# Patient Record
Sex: Female | Born: 1935 | Race: White | Hispanic: No | Marital: Single | Smoking: Former smoker
Health system: Southern US, Community
[De-identification: ages and names within clinical notes are randomized; demographics above are authoritative.]

## PROBLEM LIST (undated history)

## (undated) DIAGNOSIS — E28319 Asymptomatic premature menopause: Secondary | ICD-10-CM

## (undated) DIAGNOSIS — D219 Benign neoplasm of connective and other soft tissue, unspecified: Secondary | ICD-10-CM

## (undated) DIAGNOSIS — F419 Anxiety disorder, unspecified: Secondary | ICD-10-CM

## (undated) DIAGNOSIS — K5790 Diverticulosis of intestine, part unspecified, without perforation or abscess without bleeding: Secondary | ICD-10-CM

## (undated) DIAGNOSIS — K219 Gastro-esophageal reflux disease without esophagitis: Secondary | ICD-10-CM

## (undated) DIAGNOSIS — T7029XA Other effects of high altitude, initial encounter: Secondary | ICD-10-CM

## (undated) DIAGNOSIS — D126 Benign neoplasm of colon, unspecified: Secondary | ICD-10-CM

## (undated) DIAGNOSIS — J869 Pyothorax without fistula: Secondary | ICD-10-CM

## (undated) DIAGNOSIS — G463 Brain stem stroke syndrome: Secondary | ICD-10-CM

## (undated) HISTORY — DX: Anxiety disorder, unspecified: F41.9

## (undated) HISTORY — DX: Benign neoplasm of connective and other soft tissue, unspecified: D21.9

## (undated) HISTORY — DX: Brain stem stroke syndrome: G46.3

## (undated) HISTORY — DX: Diverticulosis of intestine, part unspecified, without perforation or abscess without bleeding: K57.90

## (undated) HISTORY — DX: Other effects of high altitude, initial encounter: T70.29XA

## (undated) HISTORY — DX: Benign neoplasm of colon, unspecified: D12.6

## (undated) HISTORY — DX: Asymptomatic premature menopause: E28.319

## (undated) HISTORY — PX: LAMINECTOMY: SHX219

---

## 1981-08-19 DIAGNOSIS — G463 Brain stem stroke syndrome: Secondary | ICD-10-CM

## 1981-08-19 HISTORY — DX: Brain stem stroke syndrome: G46.3

## 1982-08-19 HISTORY — PX: BREAST LUMPECTOMY: SHX2

## 1988-08-19 HISTORY — PX: MASTECTOMY: SHX3

## 1994-08-19 HISTORY — PX: MASTECTOMY: SHX3

## 2001-05-05 ENCOUNTER — Ambulatory Visit: Admission: RE | Admit: 2001-05-05 | Discharge: 2001-05-05 | Payer: Self-pay | Admitting: Internal Medicine

## 2002-11-29 ENCOUNTER — Encounter: Payer: Self-pay | Admitting: Orthopedic Surgery

## 2002-11-29 ENCOUNTER — Ambulatory Visit (HOSPITAL_COMMUNITY): Admission: RE | Admit: 2002-11-29 | Discharge: 2002-11-29 | Payer: Self-pay | Admitting: Orthopedic Surgery

## 2003-01-13 ENCOUNTER — Inpatient Hospital Stay (HOSPITAL_COMMUNITY): Admission: RE | Admit: 2003-01-13 | Discharge: 2003-01-14 | Payer: Self-pay | Admitting: Neurosurgery

## 2003-01-13 ENCOUNTER — Encounter: Payer: Self-pay | Admitting: Neurosurgery

## 2003-03-02 ENCOUNTER — Ambulatory Visit (HOSPITAL_COMMUNITY): Admission: RE | Admit: 2003-03-02 | Discharge: 2003-03-02 | Payer: Self-pay | Admitting: Neurosurgery

## 2003-03-02 ENCOUNTER — Encounter: Payer: Self-pay | Admitting: Neurosurgery

## 2003-03-04 ENCOUNTER — Emergency Department (HOSPITAL_COMMUNITY): Admission: EM | Admit: 2003-03-04 | Discharge: 2003-03-04 | Payer: Self-pay | Admitting: *Deleted

## 2003-03-05 ENCOUNTER — Inpatient Hospital Stay (HOSPITAL_COMMUNITY): Admission: EM | Admit: 2003-03-05 | Discharge: 2003-03-06 | Payer: Self-pay | Admitting: Emergency Medicine

## 2003-03-06 ENCOUNTER — Inpatient Hospital Stay (HOSPITAL_COMMUNITY): Admission: EM | Admit: 2003-03-06 | Discharge: 2003-03-14 | Payer: Self-pay | Admitting: Emergency Medicine

## 2003-03-08 ENCOUNTER — Encounter: Payer: Self-pay | Admitting: Neurosurgery

## 2003-03-09 ENCOUNTER — Encounter: Payer: Self-pay | Admitting: Neurosurgery

## 2003-03-29 ENCOUNTER — Encounter: Payer: Self-pay | Admitting: Neurosurgery

## 2003-03-29 ENCOUNTER — Encounter: Payer: Self-pay | Admitting: Diagnostic Radiology

## 2003-03-29 ENCOUNTER — Encounter: Admission: RE | Admit: 2003-03-29 | Discharge: 2003-03-29 | Payer: Self-pay | Admitting: Neurosurgery

## 2003-04-13 ENCOUNTER — Encounter: Admission: RE | Admit: 2003-04-13 | Discharge: 2003-04-13 | Payer: Self-pay | Admitting: Neurosurgery

## 2003-04-13 ENCOUNTER — Encounter: Payer: Self-pay | Admitting: Neurosurgery

## 2003-04-30 ENCOUNTER — Emergency Department (HOSPITAL_COMMUNITY): Admission: EM | Admit: 2003-04-30 | Discharge: 2003-04-30 | Payer: Self-pay | Admitting: Emergency Medicine

## 2005-06-04 ENCOUNTER — Ambulatory Visit: Payer: Self-pay | Admitting: Internal Medicine

## 2006-08-19 DIAGNOSIS — T7020XA Unspecified effects of high altitude, initial encounter: Secondary | ICD-10-CM

## 2006-08-19 HISTORY — DX: Unspecified effects of high altitude, initial encounter: T70.20XA

## 2006-12-26 ENCOUNTER — Encounter: Payer: Self-pay | Admitting: Internal Medicine

## 2006-12-26 ENCOUNTER — Ambulatory Visit: Payer: Self-pay | Admitting: Internal Medicine

## 2006-12-26 LAB — CONVERTED CEMR LAB
AST: 15 units/L (ref 0–37)
BUN: 20 mg/dL (ref 6–23)
Basophils Absolute: 0 10*3/uL (ref 0.0–0.1)
Calcium: 10.1 mg/dL (ref 8.4–10.5)
Creatinine, Ser: 0.75 mg/dL (ref 0.40–1.20)
Eosinophils Relative: 2 % (ref 0–5)
Glucose, Bld: 141 mg/dL — ABNORMAL HIGH (ref 70–99)
HCT: 41.5 % (ref 36.0–46.0)
MCHC: 33 g/dL (ref 30.0–36.0)
Monocytes Absolute: 0.4 10*3/uL (ref 0.2–0.7)
Monocytes Relative: 6 % (ref 3–11)
Neutrophils Relative %: 63 % (ref 43–77)
Platelets: 319 10*3/uL (ref 150–400)
RBC: 4.17 M/uL (ref 3.87–5.11)
RDW: 13.3 % (ref 11.5–14.0)
Sodium: 138 meq/L (ref 135–145)
TSH: 0.972 microintl units/mL (ref 0.350–5.50)
WBC: 6.5 10*3/uL (ref 4.0–10.5)

## 2007-01-05 ENCOUNTER — Ambulatory Visit: Payer: Self-pay | Admitting: Internal Medicine

## 2007-01-22 ENCOUNTER — Ambulatory Visit: Payer: Self-pay | Admitting: Internal Medicine

## 2007-02-05 ENCOUNTER — Encounter: Payer: Self-pay | Admitting: Internal Medicine

## 2007-03-24 ENCOUNTER — Encounter: Payer: Self-pay | Admitting: Internal Medicine

## 2007-07-22 ENCOUNTER — Encounter: Payer: Self-pay | Admitting: Internal Medicine

## 2007-10-05 ENCOUNTER — Ambulatory Visit: Payer: Self-pay | Admitting: Internal Medicine

## 2007-10-05 DIAGNOSIS — M6530 Trigger finger, unspecified finger: Secondary | ICD-10-CM | POA: Insufficient documentation

## 2007-10-05 DIAGNOSIS — M720 Palmar fascial fibromatosis [Dupuytren]: Secondary | ICD-10-CM

## 2007-12-07 ENCOUNTER — Encounter: Payer: Self-pay | Admitting: Internal Medicine

## 2008-04-01 ENCOUNTER — Encounter: Payer: Self-pay | Admitting: Internal Medicine

## 2008-05-09 ENCOUNTER — Ambulatory Visit: Payer: Self-pay | Admitting: Internal Medicine

## 2008-05-19 ENCOUNTER — Ambulatory Visit: Payer: Self-pay | Admitting: Internal Medicine

## 2008-05-19 DIAGNOSIS — J45909 Unspecified asthma, uncomplicated: Secondary | ICD-10-CM | POA: Insufficient documentation

## 2008-05-19 DIAGNOSIS — R030 Elevated blood-pressure reading, without diagnosis of hypertension: Secondary | ICD-10-CM

## 2008-05-19 DIAGNOSIS — K219 Gastro-esophageal reflux disease without esophagitis: Secondary | ICD-10-CM | POA: Insufficient documentation

## 2008-09-05 ENCOUNTER — Encounter: Payer: Self-pay | Admitting: Internal Medicine

## 2008-11-21 ENCOUNTER — Encounter: Payer: Self-pay | Admitting: Internal Medicine

## 2009-05-15 ENCOUNTER — Encounter: Payer: Self-pay | Admitting: Internal Medicine

## 2009-05-24 ENCOUNTER — Ambulatory Visit: Payer: Self-pay | Admitting: Internal Medicine

## 2009-05-24 DIAGNOSIS — G5600 Carpal tunnel syndrome, unspecified upper limb: Secondary | ICD-10-CM | POA: Insufficient documentation

## 2009-05-24 DIAGNOSIS — R7309 Other abnormal glucose: Secondary | ICD-10-CM | POA: Insufficient documentation

## 2009-05-31 ENCOUNTER — Encounter: Payer: Self-pay | Admitting: Internal Medicine

## 2009-06-16 ENCOUNTER — Ambulatory Visit: Payer: Self-pay | Admitting: Internal Medicine

## 2009-06-19 ENCOUNTER — Encounter (INDEPENDENT_AMBULATORY_CARE_PROVIDER_SITE_OTHER): Payer: Self-pay | Admitting: *Deleted

## 2009-06-19 LAB — CONVERTED CEMR LAB
Hgb A1c MFr Bld: 5.3 % (ref 4.6–6.5)
TSH: 1.45 microintl units/mL (ref 0.35–5.50)

## 2009-07-03 ENCOUNTER — Telehealth (INDEPENDENT_AMBULATORY_CARE_PROVIDER_SITE_OTHER): Payer: Self-pay | Admitting: *Deleted

## 2009-08-19 HISTORY — PX: OTHER SURGICAL HISTORY: SHX169

## 2009-08-28 ENCOUNTER — Encounter: Payer: Self-pay | Admitting: Internal Medicine

## 2009-10-10 ENCOUNTER — Encounter: Payer: Self-pay | Admitting: Internal Medicine

## 2009-10-10 ENCOUNTER — Encounter (INDEPENDENT_AMBULATORY_CARE_PROVIDER_SITE_OTHER): Payer: Self-pay | Admitting: *Deleted

## 2009-10-10 DIAGNOSIS — D126 Benign neoplasm of colon, unspecified: Secondary | ICD-10-CM

## 2009-11-23 ENCOUNTER — Encounter (INDEPENDENT_AMBULATORY_CARE_PROVIDER_SITE_OTHER): Payer: Self-pay

## 2009-11-28 ENCOUNTER — Ambulatory Visit: Payer: Self-pay | Admitting: Internal Medicine

## 2009-12-05 ENCOUNTER — Ambulatory Visit: Payer: Self-pay | Admitting: Internal Medicine

## 2009-12-07 ENCOUNTER — Encounter: Payer: Self-pay | Admitting: Internal Medicine

## 2010-01-04 ENCOUNTER — Encounter: Payer: Self-pay | Admitting: Internal Medicine

## 2010-05-14 ENCOUNTER — Ambulatory Visit: Payer: Self-pay | Admitting: Internal Medicine

## 2010-05-14 DIAGNOSIS — M2550 Pain in unspecified joint: Secondary | ICD-10-CM | POA: Insufficient documentation

## 2010-05-18 ENCOUNTER — Ambulatory Visit: Payer: Self-pay | Admitting: Internal Medicine

## 2010-09-18 NOTE — Letter (Signed)
Summary: Previsit letter  [facility]  [address] [place], [place] [address]   Phone: [phone]  Fax: [phone]       10/10/2009 MRN: [id number]  Yolanda [name] [address] [place], [place]  [address]  Dear Ms. Claudia Pollock,  Welcome to the Gastroenterology Division at [facility].    You are scheduled to see a nurse for your pre-procedure visit on  11-21-09 at 9:30AM on the 3rd floor at [facility], [address]  We ask that you try to arrive at our office 15 minutes prior to your appointment time to allow for check-in.  Your nurse visit will consist of discussing your medical and surgical history, your immediate family medical history, and your medications.    Please bring a complete list of all your medications or, if you prefer, bring the medication bottles and we will list them.  We will need to be aware of both prescribed and over the counter drugs.  We will need to know exact dosage information as well.  If you are on blood thinners (Coumadin, Plavix, Aggrenox, Ticlid, etc.) please call our office today/prior to your appointment, as we need to consult with your physician about holding your medication.   Please be prepared to read and sign documents such as consent forms, a financial agreement, and acknowledgement forms.  If necessary, and with your consent, a friend or relative is welcome to sit-in on the nurse visit with you.  Please bring your insurance card so that we may make a copy of it.  If your insurance requires a referral to see a specialist, please bring your referral form from your primary care physician.  No co-pay is required for this nurse visit.     If you cannot keep your appointment, please call [phone] to cancel or reschedule prior to your appointment date.  This allows [place] the opportunity to schedule an appointment for another patient in need of care.    Thank you for choosing [facility] for your medical needs.   We appreciate the opportunity to care for you.  Please visit [place] at our website  to learn more about our practice.                     Sincerely.                                                                                                                   The Gastroenterology Division

## 2010-09-18 NOTE — Letter (Signed)
Summary: CMN for Mastectomy Bra/Second to [facility] for Mastectomy Bra/Second to [facility]   Imported By: Lanelle Bal 01/11/2010 10:10:00  _____________________________________________________________________  External Attachment:    Type:   Image     Comment:   External Document

## 2010-09-18 NOTE — Miscellaneous (Signed)
Summary: Orders Update  Clinical Lists Changes  Problems: Added new problem of SIGMOID POLYP (ICD-211.3) - 4 mm polyp L sigmoid on CT Colonography 08/28/2009 in [place] Orders: Added new Referral order of Gastroenterology Referral (GI) - Signed

## 2010-09-18 NOTE — Letter (Signed)
Summary: [facility] Instructions  [facility]  [address] [place], [place] [address]   Phone: [phone]  Fax: [phone]       Yolanda [name]    1934-03-05    MRN: [id number]       Procedure Day /Date: Tuesday  12/05/09     Arrival Time:   8:30am     Procedure Time:  9:30am     Location of Procedure:                    _X _  [facility] (4th Floor)    PREPARATION FOR COLONOSCOPY WITH MIRALAX  Starting 5 days prior to your procedure  Thursday 04/14 do not eat nuts, seeds, popcorn, corn, beans, peas,  salads, or any raw vegetables.  Do not take any fiber supplements (e.g. Metamucil, Citrucel, and Benefiber). ____________________________________________________________________________________________________   THE DAY BEFORE YOUR PROCEDURE         DATE: 04/18  DAY:  Monday  1   Drink clear liquids the entire day-NO SOLID FOOD  2   Do not drink anything colored red or purple.  Avoid juices with pulp.  No orange juice.  3   Drink at least 64 oz. (8 glasses) of fluid/clear liquids during the day to prevent dehydration and help the prep work efficiently.  CLEAR LIQUIDS INCLUDE: Water Jello Ice Popsicles Tea (sugar ok, no milk/cream) Powdered fruit flavored drinks Coffee (sugar ok, no milk/cream) Gatorade Juice: apple, white grape, white cranberry  Lemonade Clear bullion, consomm, broth Carbonated beverages (any kind) Strained chicken noodle soup Hard Candy  4   Mix the entire bottle of Miralax with 64 oz. of Gatorade/Powerade in the morning and put in the refrigerator to chill.  5   At 3:00 pm take 2 Dulcolax/Bisacodyl tablets.  6   At 4:30 pm take one Reglan/Metoclopramide tablet.  7  Starting at 5:00 pm drink one 8 oz glass of the Miralax mixture every 15-20 minutes until you have finished drinking the entire 64 oz.  You should finish drinking prep around 7:30 or 8:00 pm.  8   If you are nauseated, you may take the 2nd  Reglan/Metoclopramide tablet at 6:30 pm.        9    At 8:00 pm take 2 more DULCOLAX/Bisacodyl tablets.     THE DAY OF YOUR PROCEDURE      DATE:  04/19  DAY:  Tuesday  You may drink clear liquids until  7:30am   (2 HOURS BEFORE PROCEDURE).   MEDICATION INSTRUCTIONS  Unless otherwise instructed, you should take regular prescription medications with a small sip of water as early as possible the morning of your procedure.         OTHER INSTRUCTIONS  You will need a responsible adult at least 75 years of age to accompany you and drive you home.   This person must remain in the waiting room during your procedure.  Wear loose fitting clothing that is easily removed.  Leave jewelry and other valuables at home.  However, you may wish to bring a book to read or an iPod/MP3 player to listen to music as you wait for your procedure to start.  Remove all body piercing jewelry and leave at home.  Total time from sign-in until discharge is approximately 2-3 hours.  You should go home directly after your procedure and rest.  You can resume normal activities the day after your procedure.  The day of your procedure  you should not:   Drive   Make legal decisions   Operate machinery   Drink alcohol   Return to work  You will receive specific instructions about eating, activities and medications before you leave.   The above instructions have been reviewed and explained to me by   Ulis Rias RN  November 28, 2009 9:53 AM     I fully understand and can verbalize these instructions _____________________________ Date _______

## 2010-09-18 NOTE — Assessment & Plan Note (Signed)
Summary: FOLLOWUP APPT SINCE SHE IS IN TOWN///SPH   Vital Signs:  Patient profile:   77 year old female Weight:      109.4 pounds BMI:     19.45 Temp:     99.3 degrees F oral Pulse rate:   72 / minute Resp:     14 per minute BP sitting:   126 / 80  (left arm) Cuff size:   regular  Vitals Entered By: Shonna Chock CMA (May 14, 2010 3:30 PM) CC: Patient having difficulty with her hands , Lower Extremity Joint pain   CC:  Patient having difficulty with her hands  and Lower Extremity Joint pain.  History of Present Illness: Upper Extremity  Pain      This is a 77 year old woman who presents with  pain in 3rd L finger X 3-6 months.  The patient reports swelling and decreased ROM, but denies redness, locking, stiffness for >1 hr, and weakness.    The pain is described as sharp and constant.  The patient denies the following symptoms: fever, rash, photosensitivity, eye symptoms, diarrhea, and dysuria.  Pain is also present in palms bilaterally. Rx: none.  Current Medications (verified): 1)  Cymbalta-? Dose .Marland Kitchen.. 1 By Mouth Once Daily 2)  Trazodone Hcl 50 Mg  Tabs (Trazodone Hcl) .Marland Kitchen.. 1 By Mouth Qhs  Allergies: 1)  ! * No Mri  Physical Exam  General:  Thin,in no acute distress; alert,appropriate and cooperative throughout examination Pulses:  R and L  radial pulses are full and equal bilaterally Extremities:  Mild Dupuytren's contracture bilaterally. Mild OA DIP changes. Fusiform PIP enlargement 3rd L joint Skin:  Intact without suspicious lesions or rashes Cervical Nodes:  No lymphadenopathy noted Axillary Nodes:  No palpable lymphadenopathy   Impression & Recommendations:  Problem # 1:  ARTHRALGIA (ICD-719.40)  Orders: T-Finger(s) (73140TC)  Complete Medication List: 1)  Cymbalta-? Dose  .Marland KitchenMarland Kitchen. 1 by mouth once daily 2)  Trazodone Hcl 50 Mg Tabs (Trazodone hcl) .Marland Kitchen.. 1 by mouth qhs 3)  Voltaren 1 % Gel (Diclofenac sodium) .... Apply qid as needed to affected  finger  joint  Patient Instructions: 1)  Xrays @ [facility] facility of affected joint . Prescriptions: VOLTAREN 1 % GEL (DICLOFENAC SODIUM) apply qid as needed to affected  finger joint  #15 grams x 3   Entered and Authorized by:   Marga Melnick MD   Signed by:   Marga Melnick MD on 05/14/2010   Method used:   Print then Give to Patient   RxID:   [id number]   Appended Document: FOLLOWUP APPT SINCE SHE IS IN TOWN///SPH Flu Vaccine Consent Questions     Do you have a history of severe allergic reactions to this vaccine? no    Any prior history of allergic reactions to egg and/or gelatin? no    Do you have a sensitivity to the preservative Thimersol? no    Do you have a past history of Guillan-Barre Syndrome? no    Do you currently have an acute febrile illness? no    Have you ever had a severe reaction to latex? no    Vaccine information given and explained to patient? yes    Are you currently pregnant? no    Lot Number:[id number]   Exp Date:02/16/2011   Site Given  Left Deltoid IM

## 2010-09-18 NOTE — Letter (Signed)
Summary: Patient Notice- Polyp Results  [facility]  [address] [place], [place] [address]   Phone: [phone]  Fax: [phone]        December 07, 2009 MRN: [id number]    Yolanda [name] [address] [place], [place]  [address]    Dear [name],  I am pleased to inform you that the colon polyp(s) removed during your recent colonoscopy was (were) found to be benign (no cancer detected) upon pathologic examination.The polyp was adenomatous ( precancerous)  I recommend you have a repeat colonoscopy examination in 5_ years to look for recurrent polyps, as having colon polyps increases your risk for having recurrent polyps or even colon cancer in the future.  Should you develop new or worsening symptoms of abdominal pain, bowel habit changes or bleeding from the rectum or bowels, please schedule an evaluation with either your primary care physician or with me.  Additional information/recommendations:  _x_ No further action with gastroenterology is needed at this time. Please      follow-up with your primary care physician for your other healthcare      needs.  __ Please call [phone] to schedule a return visit to review your      situation.  __ Please keep your follow-up visit as already scheduled.  __ Continue treatment plan as outlined the day of your exam.  Please call us if you are having persistent problems or have questions about your condition that have not been fully answered at this time.  Sincerely,  Hart Carwin MD  This letter has been electronically signed by your physician.  Appended Document: Patient Notice- Polyp Results letter mailed 4.25.11  Appended Document: Patient Notice- Polyp Results UNABLE TO REACH PATIENT TO CORRECT ADDRESS. LETTER UNDELIVERABLE AS ADDRESSED.

## 2010-09-18 NOTE — Miscellaneous (Signed)
Summary: Lec previsit  Clinical Lists Changes  Medications: Added new medication of MIRALAX   POWD (POLYETHYLENE GLYCOL 3350) As per prep  instructions. - Signed Added new medication of REGLAN 10 MG  TABS (METOCLOPRAMIDE HCL) As per prep instructions. - Signed Added new medication of DULCOLAX 5 MG  TBEC (BISACODYL) Day before procedure take 2 at 3pm and 2 at 8pm. - Signed Rx of MIRALAX   POWD (POLYETHYLENE GLYCOL 3350) As per prep  instructions.;  #255gm x 0;  Signed;  Entered by: Ulis Rias RN;  Authorized by: Hart Carwin MD;  Method used: Electronically to [facility] [phone]*, [address] [place], [place]  [address], Ph: [phone], Fax: [phone] Rx of REGLAN 10 MG  TABS (METOCLOPRAMIDE HCL) As per prep instructions.;  #2 x 0;  Signed;  Entered by: Ulis Rias RN;  Authorized by: Hart Carwin MD;  Method used: Electronically to [facility] [phone]*, [address], [place], [place]  [address], Ph: [phone], Fax: [phone] Rx of DULCOLAX 5 MG  TBEC (BISACODYL) Day before procedure take 2 at 3pm and 2 at 8pm.;  #4 x 0;  Signed;  Entered by: Ulis Rias RN;  Authorized by: Hart Carwin MD;  Method used: Electronically to [facility] [phone]*, [address], [place], [place]  [address], Ph: [phone], Fax: [phone]    Prescriptions: DULCOLAX 5 MG  TBEC (BISACODYL) Day before procedure take 2 at 3pm and 2 at 8pm.  #4 x 0   Entered by:   Ulis Rias RN   Authorized by:   Hart Carwin MD   Signed by:   Ulis Rias RN on 11/28/2009   Method used:   Electronically to        [facility] [phone]* (retail)       [address]       [place], [place]  [address]       Ph: [phone]       Fax: [phone]   RxID:   [id number] REGLAN 10 MG  TABS (METOCLOPRAMIDE HCL) As per prep instructions.  #2 x 0   Entered by:   Ulis Rias RN   Authorized by:   Hart Carwin MD   Signed by:   Ulis Rias RN on 11/28/2009   Method used:   Electronically to        [facility] [phone]* (retail)       [address]       [place], [place]  [address]       Ph: [phone]       Fax: [phone]   RxID:   [id number] MIRALAX   POWD (POLYETHYLENE GLYCOL 3350) As per prep  instructions.  #255gm x 0   Entered by:   Ulis Rias RN   Authorized by:   Hart Carwin MD   Signed by:   Ulis Rias RN on 11/28/2009   Method used:   Electronically to        [facility] [phone]* (retail)       [address]       [place], [place]  [address]       Ph: [phone]       Fax: [phone]   RxID:   [id number]

## 2010-09-18 NOTE — Procedures (Signed)
Summary: Colonoscopy  Patient: [name] Note: All result statuses are Final unless otherwise noted.  Tests: (1) Colonoscopy (COL)   COL Colonoscopy           DONE     [facility]     [address]     [place], [place]  [address]           COLONOSCOPY PROCEDURE REPORT           PATIENT:  Yolanda [name], Yolanda [name]  MR#:  [id number]     BIRTHDATE:  17-Jul-1934, 75 yrs. old  GENDER:  female     ENDOSCOPIST:  Hedwig Morton. Juanda Chance, MD     REF. BY:  Marga Melnick, M.D.     PROCEDURE DATE:  12/05/2009     PROCEDURE:  Colonoscopy 29562     ASA CLASS:  Class II     INDICATIONS:  Routine Risk Screening virtual colonoacopy suggested     4 mm polyp in the sigmoid colon     MEDICATIONS:   Versed 10 mg, Fentanyl 100 mcg, Benadryl 25 mg           DESCRIPTION OF PROCEDURE:   After the risks benefits and     alternatives of the procedure were thoroughly explained, informed     consent was obtained.  Digital rectal exam was performed and     revealed no rectal masses.   The LB [id number] endoscope     was introduced through the anus and advanced to the cecum, which     was identified by the ileocecal valve, without limitations.  The     quality of the prep was good, using MiraLax.  The instrument was     then slowly withdrawn as the colon was fully examined.     <<PROCEDUREIMAGES>>           FINDINGS:  A pedunculated polyp was found in the sigmoid colon. at     20 cm 4 mm polyps removed with cold snare, hemostasis achieved     with hot biopsy Polyp was snared without cautery. Retrieval was     successful (see image7, image8, image9, and image10). snare polyp     Mild diverticulosis was found throughout the colon (see image6 and     image1).  This was otherwise a normal examination of the colon     (see image2, image3, image4, image5, and image11).   Retroflexed     views in the rectum revealed no abnormalities.    The scope was     then withdrawn from the patient and the procedure  completed.           COMPLICATIONS:  None     ENDOSCOPIC IMPRESSION:     1) Pedunculated polyp in the sigmoid colon     2) Mild diverticulosis throughout the colon     3) Otherwise normal examination     the polyps correlates with the description on virtual     colonoscopy     RECOMMENDATIONS:     1) Await pathology results     2) High fiber diet.     REPEAT EXAM:  In 5 year(s) for.           ______________________________     Hedwig Morton. Juanda Chance, MD           CC:           n.     eSIGNED:   Hedwig Morton. Gwendalynn Eckstrom at 12/05/2009 10:27  AM           Venida Jarvis, [id number]  Note: An exclamation mark (!) indicates a result that was not dispersed into the flowsheet. Document Creation Date: 12/05/2009 10:28 AM _______________________________________________________________________  (1) Order result status: Final Collection or observation date-time: 12/05/2009 10:20 Requested date-time:  Receipt date-time:  Reported date-time:  Referring Physician:   Ordering Physician: Lina Sar [phone]) Specimen Source:  Source: Launa Grill Order Number: [id number] Lab site:   Appended Document: Colonoscopy     Procedures Next Due Date:    Colonoscopy: 12/2014

## 2010-09-18 NOTE — Letter (Signed)
Summary: Primary Care Consult Scheduled Letter  [facility]  [address] [place], [place] [address]   Phone: [phone]  Fax: [phone]      10/10/2009 MRN: [id number]  Yolanda [name] [address] [place], [place]  [address]    Dear Ms. Yolanda [name],    We have scheduled an appointment for you.  At the recommendation of Dr. Marga Melnick, we have scheduled you for a Screening Colonoscopy with Dr. Lina Sar with [facility].  Your initial Pre-Visit with a nurse is on 11-21-2009 at 9:30am.  Your Screening Colonoscopy with Dr. Juanda Chance is on 12-05-2009 arrive no later than 8:30am.  Their address is [address] [place] [address]. The office phone number is [phone].  If this appointment day and time is not convenient for you, please feel free to call the office of the doctor you are being referred to at the number listed above and reschedule the appointment.    It is important for you to keep your scheduled appointments. We are here to make sure you are given good patient care.   Thank you,    Renee, Patient Care Coordinator [place] [facility]

## 2010-09-18 NOTE — Procedures (Signed)
Summary: CT Colongraphy/[facility]  CT Colongraphy/[facility]   Imported By: Lanelle Bal 10/16/2009 09:37:55  _____________________________________________________________________  External Attachment:    Type:   Image     Comment:   External Document

## 2011-01-01 NOTE — Assessment & Plan Note (Signed)
[facility]                        [facility] [facility] OFFICE NOTE   NAME:BARRINGERJaliya, [name]                    MRN:          [id number]  DATE:12/26/2006                            DOB:          1934-08-02    Yolanda [name] was seen Dec 26, 2006, for preoperative clearance for colonoscopy  and to update records.   Significantly, three weeks ago, she had high-altitude sickness in [place].  An EKG was done at that time.   Her past history is long and complicated.  In 1984, she had a  lumpectomy, followed by radiation.  In 1990, she had recurrence in the  same site, requiring mastectomy.  In 1996, she had a left mastectomy for  intraductal cancer.  She did take Lupron for pseudomenopause/fibroids.  She has had four separate back surgeries.   Because of the chronic pain, she was previously followed in a pain  clinic and had a nerve stimulator placed.  There was suboptimal  response.  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   Her family history is incredibly good.  Maternal grandmother had asthma.   She quit smoking in 1980.  She drinks socially.  She has no known drug  allergies.   1. She is on Trazodone 50 mg at bedtime.  2. Prozac 40 mg daily.  3. Celebrex and Oxycodone as needed.   She does have significant pain in the left shoulder with decreased range  of motion.   She is not exercising.  She denies any cardiopulmonary symptoms.  Diet  is described as heart healthy.   She has no GI symptomatology.   On exam, her weight is stable at 117.6, pulse is 64, respiratory rate 14  and blood pressure 110/62.  She appears much younger than her stated  age.  She denies any cosmetic surgery.   The thyroid is small and slightly asymmetric, without nodules.  She has  no lymphadenopathy about the head, neck or axillae.   She does have a grade 1 systolic murmur, loudest at the base.  Her chest  is clear.   She has multiple  operative scars over the abdomen.  She has no masses or  tenderness.   Pedal pulses are slightly decreased.  She has no ischemic change and  there is no edema.   Neuropsychiatric exam is negative.   There is marked restriction of movement over the left shoulder.  She  does have some degenerative changes in her hands.   Stool cards will be provided.  She has had no recent labs done and the  last labs on our chart are from 2003.  A CBC, BMET, and liver functions  will be drawn.   Her chart has been marked she is not a candidate for MRI, due to the  retained lead in her spine.   A screening colonoscopy will be requested, if these studies are  negative.     Titus Dubin. Alwyn Ren, MD,FACP,FCCP  Electronically Signed    [facility]  DD: 12/26/2006  DT: 12/26/2006  Job #: [id number]

## 2011-01-04 NOTE — H&P (Signed)
NAME:  Yolanda [name], Yolanda [name]                       ACCOUNT NO.:  [id number]   MEDICAL RECORD NO.:  [id number]                   PATIENT TYPE:  INP   LOCATION:  3007                                 FACILITY:  [facility]   PHYSICIAN:  Stefani Dama, M.D.               DATE OF BIRTH:  02/21/1934   DATE OF ADMISSION:  03/05/2003  DATE OF DISCHARGE:  03/06/2003                                HISTORY & PHYSICAL   ADMITTING DIAGNOSES:  1. Intractable back pain.  2. Status post lumbar laminectomy, three weeks.  3. Rule out deep space infection, osteomyelitis.   HISTORY OF PRESENT ILLNESS:  Yolanda [name] is a 77 year old white female,  status post lumbar laminectomy three weeks ago.  She started to develop  severe spasms in her back this past week and MRIs of the lumbar spine and  the left hip were apparently performed and she was to be seen by Dr. Hewitt Shorts yesterday, however, she reported to the emergency room and she  was incapacitated with spasms in her hip and her left lower extremity.  She  was treated with IV Valium and this seemed to resolve the situation and she  was able to be discharged home, however, this morning, the patient developed  severe incapacitating spasms in the left hip and left lower extremity; she  was brought back to the emergency department.  She notes that she has been  in generally good health, has had previous operations, but has not  experienced such severe pain in the hip and leg.  She has not really  complained of significant pain in her back per se.   PAST MEDICAL HISTORY:  Her past medical history notes that she has generally  had good health, save for a history of a stroke in the past.   CURRENT MEDICATIONS:  Her current medications include:  1. Evista.  2. Prozac 20 mg a day.   ALLERGIES:  She notes no allergies to any medications.   PHYSICAL EXAMINATION:  VITAL SIGNS:  Her physical exam at this time reveals  that her temperature is 97, her  blood pressure is 130/82, heart rate is 90  and respirations are 16 and unlabored.  GENERAL:  She is alert, oriented and very anxious with a severe racking  spasm on the left hip and left leg with slight movement.  When I ask her to  roll up onto her side, she cannot do so, but appears to have a moderate  amount of tremulousness; after 5 mg of Valium intravenously, this  tremulousness subsides and the spasms are much less severe and she is able  to roll up onto the left side to be examined.  BACK/MUSCULOSKELETAL:  Examination of the incision her back reveals that it  is clean, dry, flat and well-healed.  There is no tenderness to palpation  and even light percussion in the incision, however, certain percussion tends  to reproduce pain in the region of the left hip.  Motion in the region of  the left hip tends to aggravate the pain in the anterior thigh and left hip  region.  NEUROLOGIC:  Her motor strength appears good in the iliopsoas, quad,  tibialis anterior and gastrocs bilaterally.  Deep tendon reflexes are 2+ in  the patellae and 1+ in the Achilles.  Babinskis are downgoing.  Sensation is  intact.  HEENT:  Exam is normal.  LUNGS:  Lungs are clear to auscultation.  HEART:  The heart has a regular rate and rhythm.  No murmurs are heard.  ABDOMEN:  The abdomen is soft.  Bowel sounds are positive.  No masses are  palpable.  EXTREMITIES:  Extremities reveal no cyanosis, clubbing or edema.  NEUROLOGIC:  Cranial nerve examination reveals the pupils are 4 mm, briskly  reactive to light and accommodation.  The extraocular movements are full and  the face is symmetric to grimace.  Tongue and uvula are in the midline.   IMPRESSION:  The patient has evidence of significant, quite severe back pain  without evidence of any reported abnormality on the MRI.  I suggested that  we admit the patient and observe her for the possibility of a deep space  infection and draw blood cultures if  appropriate.  A complete blood count  and a sedimentation rate are now pending.  I will review the patient's  previous MRIs to see if the findings are accurate, according to what has  been reported.                                               Stefani Dama, M.D.    Merla Riches  D:  03/05/2003  T:  03/07/2003  Job:  [id number]

## 2011-01-04 NOTE — Consult Note (Signed)
   NAME:  Yolanda [name], Yolanda [name] NO.:  [id number]   MEDICAL RECORD NO.:  [id number]                   PATIENT TYPE:  INP   LOCATION:                                       FACILITY:  [facility]   PHYSICIAN:  Stefani Dama, M.D.               DATE OF BIRTH:  [birth year]   DATE OF CONSULTATION:  03/05/2003  DATE OF DISCHARGE:                                   CONSULTATION   REASON FOR CONSULTATION:  Intractable pain.   HISTORY OF PRESENT ILLNESS:  Patient is a 77 year old individual, whom I  just discharged from the hospital this afternoon.  She was admitted  yesterday with intractable pain in the region of the left hip, and, at the  time of discharge, the patient was ambulatory with just some minimal  discomfort in the left hip, and no workup prior to her admission.  From an  admission to the emergency room the day before; that is Friday, March 04, 2003, revealed an MRI of the lumbar spine that showed typical postoperative  changes with no abnormalities and an MRI of the hip, which showed no  pathology.  Blood tests, including a CBC and a sed rate, were both  completely normal.  Patient had suspicion of a low-grade temperature of  100.5; however, no other temperature spikes have been noted.  The patient  was treated for muscular spasms, and this seemed to resolve the situation,  but now she presents back to the emergency room.  She will be readmitted for  further care in this regard.  Situation will be discussed with Dr. Shirlean Kelly in the morning.                                               Stefani Dama, M.D.    Merla Riches  D:  03/07/2003  T:  03/07/2003  Job:  [id number]

## 2011-01-04 NOTE — H&P (Signed)
NAME:  Yolanda [name], Yolanda [name]                       ACCOUNT NO.:  [id number]   MEDICAL RECORD NO.:  [id number]                   PATIENT TYPE:  INP   LOCATION:  3007                                 FACILITY:  [facility]   PHYSICIAN:  Stefani Dama, M.D.               DATE OF BIRTH:  [birth year]   DATE OF ADMISSION:  03/05/2003  DATE OF DISCHARGE:  03/06/2003                                HISTORY & PHYSICAL   ADMISSION DIAGNOSES:  Intractable hip pain.   HISTORY OF PRESENT ILLNESS:  The patient is a 77 year old individual who was  admitted to the hospital on March 05, 2003 for difficulties with intractable  hip and left leg pain. She was discharged earlier this afternoon at 2:00 and  was ambulatory, feeling relatively comfortable, minimal pain. She was given  prescriptions for Percocet #60, Valium 5 mg #40 without refills, and Bextra  10 mg b.i.d. Approximately 8:00 this evening, I was contacted, indicating  that the patient was experiencing severe and excruciating pain. She had had  a workup including a MRI of the hip and lumbar spine. The lumbar spine shows  typical postoperative changes at L4-L5 on the left side, and the hip films  are completely within the limits of normal without any evidence of a  fracture, joint diffusion, or other significant pathology. Nonetheless, the  patient complaints of severe gripping pain in the left hip such that she  cannot move the hip at all. After 1 mg of IV Ativan, the patient does feel  significantly improved. Her other past medical history is noted on her  previous admission.   PHYSICAL EXAMINATION:  At this time reveals after some medication she has  mobility in the region of the left hip. She still notes a gripping pain at  times when she moves about. Her motor strength is good in the iliopsoas,  quadriceps, tibialis, and gastrocs. Reflexes were 2+ and symmetrical in the  patellae and in the Achilles; Babinskis are downgoing. Sensation is intact  in the left lower extremity. Incision is well healed, and there is no  palpable or percussive tenderness in the lumbar spine incision. Her upper  strength and reflexes are normal, and cranial nerve examination reveals that  the pupils are 4 mm, brisk, reactive to light and accommodation. Extraocular  movements are full. The face is symmetric. Tongue and uvula in the midline.  Sclerae and conjunctivae are clear.  NECK:  Supple. No masses are noted.  LUNGS:  Clear to auscultation.  HEART:  Regular rate and rhythm; no murmurs are heard.  ABDOMEN:  Soft, bowel sounds positive. No masses are palpable.  EXTREMITIES:  Reveal no clubbing, cyanosis, or edema.   IMPRESSION:  The patient is readmitted now with intractable lumbar spine  pain status post three week lumbar laminectomy and decompression. She will  be treated with medications for the time being; however, further workup and  additional consultation will  be warranted.                                               Stefani Dama, M.D.    Merla Riches  D:  03/07/2003  T:  03/07/2003  Job:  [id number]

## 2011-01-04 NOTE — Op Note (Signed)
NAME:  Yolanda [name], Yolanda [name]                       ACCOUNT NO.:  [id number]   MEDICAL RECORD NO.:  [id number]                   PATIENT TYPE:  INP   LOCATION:  3012                                 FACILITY:  [facility]   PHYSICIAN:  Hewitt Shorts, M.D.            DATE OF BIRTH:  12-20-1933   DATE OF PROCEDURE:  01/13/2003  DATE OF DISCHARGE:                                 OPERATIVE REPORT   PREOPERATIVE DIAGNOSIS:  Left L4-L5 foraminal lumbar degenerative disc  disease, spondylosis and radiculopathy.   POSTOPERATIVE DIAGNOSIS:  Left L4-L5 foraminal lumbar degenerative disc  disease, spondylosis and radiculopathy.   PROCEDURE:  Left L4-L5 lumbar laminotomy and microdiscectomy with  microdissection.   SURGEON:  Hewitt Shorts, M.D.   ASSISTANT:  Clydene Fake, M.D.   ANESTHESIA:  General endotracheal anesthesia.   INDICATIONS FOR PROCEDURE:  This 77 year old woman presented with left  lumbar radiculopathy and was found by MRI scan to have a fairly large left  L4-L5 foraminal disc herniation extending into the lateral epidural space as  well as in the extraforaminal space.  The decision was made to proceed with  exploration both through the canal as well as extraforaminally.   PROCEDURE:  The patient was brought to the operating room and placed under  general endotracheal anesthesia.  The patient was turned to the prone  position and the lumbar region was prepped with DuraPrep and draped in a  sterile fashion.  The midline was infiltrated with local anesthetic with  epinephrine.  An x-ray was taken, the L4-L5 level identified, and then the  previous midline incision was reopened and dissection was carried down  through the subcutaneous tissue to the level of the lumbar fascia which was  incised to the left side of the midline and the paraspinal muscles with  dissection of the spinous process and lamina in a subperiosteal fashion.  The L4-L5 interlaminar space was  identified.  Dissection was carried out  laterally over the facet joint.  We identified the left L5 transverse  process.  We then proceeded with a laminotomy using the [facility] Max drill  along with Kerrison punches.  The microscope was draped and brought onto the  field to provide illumination and visualization and the remainder of the  procedure was performed using microdissection and microsurgical technique.  The laminotomy was continued and we carefully resected the ligamentum flavum  which was thickened and partially calcified.  We identified the lateral  aspect of the thecal sac and identified the left L4-L5 foramen and the disc  which was degenerated with a large spondylotic subligamentous disc  herniation.  We then explored the extraforaminal space.  We were able to  expose the left L4 nerve root and the left L4-L5 annulus and disc.  There  was, again, subligamentous spondylitic disc herniation.  The annulus was  incised both extraforaminally as well as in the left lateral epidural space.  We  entered into the disc space and proceeded with a thorough discectomy  using a variety of pituitary rongeurs.  We then carefully explored the left  L4-L5 foramen both from within the canal working laterally as well as from  the extraforaminal space working medially.  There is subligamentous disc  herniation that was removed.  There was significant spondylitic overgrowth.  In the end, good decompression of the foramen and removal of all of the disc  herniation was achieved both within the canal, the foramen, and the  extraforaminal space.  We were able to decompress the thecal sac and left L4  nerve root.  We did examine the left L5 nerve root as it exited through the  nerve root foramen below and there was no evidence of compression at that  level.  It was felt that good decompression was achieved with removal of all  loose fragments of disc material.  Once the discectomy was completed,  hemostasis  was established with the use of bipolar cautery and then we  instilled 2 cc of Fentanyl and 80 mg Depo-Medrol into the epidural space and  extraforaminal space and then proceeded with closure.  The deep fascia was  closed with interrupted undyed #1 Vicryl suture, the subcutaneous and  subcuticular layer was closed with interrupted inverted 2-0 Vicryl sutures,  and the skin was reapproximated with Dermabond.  The patient tolerated the  procedure well.  Estimated blood loss was 35 cc.  Sponge counts were  correct.  Following surgery, the patient was turned back to the supine  position, reversed from the anesthetic, extubated, and transferred to the  recovery room for further care where she was noted to be moving all four  extremities to command.                                               Hewitt Shorts, M.D.    RWN/MEDQ  D:  01/13/2003  T:  01/13/2003  Job:  [id number]

## 2011-01-04 NOTE — Discharge Summary (Signed)
NAME:  Yolanda [name], Yolanda [name]                       ACCOUNT NO.:  [id number]   MEDICAL RECORD NO.:  [id number]                   PATIENT TYPE:  INP   LOCATION:  3007                                 FACILITY:  [facility]   PHYSICIAN:  Hewitt Shorts, M.D.            DATE OF BIRTH:  Apr 25, 1934   DATE OF ADMISSION:  03/06/2003  DATE OF DISCHARGE:  03/14/2003                                 DISCHARGE SUMMARY   ADMISSION HISTORY AND PHYSICAL EXAMINATION:  The patient is a 77 year old  woman who underwent a left L4-5 lumbar laminotomy and extra foraminal  exploration for lumbar diskectomy on Jan 13, 2003. The patient over the past  month has been having increased difficulty with recurrent pain, particularly  in the lateral left hip; however, beginning two days prior to admission, she  developed incapacitating spasms associated with the pain. She had been  hospitalized on July 17 by Dr. Danielle Dess, discharged on July 18 but returned  once again to the emergency room on July 18 and was readmitted by Dr.  Danielle Dess. Further details are included in his admission note.   The patient had MRI of the lumbar spine and hips done a week or so prior to  hospitalization. We did find on examination she had full strength through  the lower extremities with symmetrical reflexes. She was started on Flexeril  and subsequently Neurontin was started, and we were able to gradual increase  the Neurontin through the hospitalization. The patient continued to have  episodic disabling spasms. We did x-rays of her pelvis and left hip which  were unremarkable. MRI was repeated of the lumbar spine and reviewed in  detail with Dr. Paulina Fusi from the radiology department. We found  extensive post surgical changes at the L4-5 level, both at the level of  epidural space as well as in the extra foraminal space, but no evidence of  recurrent disk herniation or infection. We did find moderate canal stenosis  and marked bilateral  recessed stenosis at the L3-4 level.   We discussed options with the patient including continued rest and  medications versus selective spinal injection, specifically a L3-4 epidural  steroid injection versus an extensive surgical intervention with  decompression of the stenosis at L3-4, further decompression on the left  side at L3-4, and L3 to L5 fusion. She has indicated that she would like to  avoid surgery at all costs. She vigorously asked to be treated with a  tapering oral steroid dose, and after a lengthy discussion, we agreed to  that. She was started on prednisone at a relatively high dose, and it was  steadily tapered through the hospitalization, and with that along with  gradually increasing the Neurontin dose to 300 mg in the morning and 600 mg  at night, the discomfort has eased, and the frequency and intensity of  spasms has significantly lessened. The patient did request transfer to  neurosurgery at [facility]; however, on contact of the  physicians there, they were unable to accept her in transfer, and she  accepted that.   At this point, the patient is asking to be discharged to home. She feels  that the discomfort is manageable, and with that in mind, we have given her  detailed instructions about limiting her bending, lifting, twisting,  turning, carrying, and so on. She is not to drive. She is to do a small  amount of walking. She is not to travel any great distance. She is to return  to see me in a week and a half for followup. At that point, we may initiate  program of physical therapy or may consider epidural steroid injections.   DISCHARGE MEDICATIONS:  1. Prednisone 5 mg tablets. She is take two tablets b.i.d. for two days,     then one tablet q.a.m. and two tablets q.p.m. for two days, then one     tablet b.i.d. for two days, then one tablet q.d. for two days, then     discontinue. Twenty tablets prescribed, no refills.  2. Darvocet-N 100  one tablet p.o. t.i.d. p.r.n. pain, 40 tablets, no     refills.  3. Flexeril 10 mg t.i.d., 100 tablets, three refills.  4. Neurontin 300 mg capsules, one capsule q.a.m., two capsules q.p.m., 100     capsules prescribed, and five refills.  5. She is to continue on her other home medications such as Prozac.   DISCHARGE DIAGNOSES:  1. Lumbar radiculopathy.  2. Lumbar stenosis.  3. Lumbar spondylosis.  4. Lumbar degenerative disk disease.  5. Post surgical changes on the left at L4-5.  6. Lumbar radiculopathy and incapacitating muscular spasms.                                                Hewitt Shorts, M.D.    RWN/MEDQ  D:  03/14/2003  T:  03/15/2003  Job:  [id number]

## 2011-07-25 ENCOUNTER — Ambulatory Visit (INDEPENDENT_AMBULATORY_CARE_PROVIDER_SITE_OTHER): Payer: PRIVATE HEALTH INSURANCE | Admitting: Internal Medicine

## 2011-07-25 ENCOUNTER — Encounter: Payer: Self-pay | Admitting: Internal Medicine

## 2011-07-25 VITALS — BP 122/80 | HR 71 | Temp 98.8°F | Resp 12 | Ht 62.5 in | Wt 115.2 lb

## 2011-07-25 DIAGNOSIS — R7309 Other abnormal glucose: Secondary | ICD-10-CM

## 2011-07-25 DIAGNOSIS — Z23 Encounter for immunization: Secondary | ICD-10-CM

## 2011-07-25 DIAGNOSIS — R030 Elevated blood-pressure reading, without diagnosis of hypertension: Secondary | ICD-10-CM

## 2011-07-25 NOTE — Patient Instructions (Addendum)
Preventive Health Care: Exercise  30-45  minutes a day, 3-4 days a week. Walking is especially valuable in preventing Osteoporosis. Eat a low-fat diet with lots of fruits and vegetables, up to 7-9 servings per day.Consume less than 30 grams of sugar per day from foods & drinks with High Fructose Corn Syrup as #1,2,3 or #4 on label. Please  schedule fasting Labs : BMET,Lipids, hepatic panel, CBC & dif, TSH, A1c. PLEASE BRING THESE INSTRUCTIONS TO FOLLOW UP  LAB APPOINTMENT.This will guarantee correct labs are drawn, eliminating need for repeat blood sampling ( needle sticks ! ). Diagnoses /Codes: V70.0, 790.29.

## 2011-07-25 NOTE — Progress Notes (Signed)
Subjective:    Patient ID: Yolanda [name], female    DOB: [birth year], 77 y.o.   MRN: [id number]  HPI   Yolanda [name]  is here for a physical;acute issues include diffuse pain syndrome including the neck, shoulder and low back. She's taking narcotic pain medicine. Her major symptoms relate to having had 3 laminectomies.      Review of Systems PMH of ELEVATED BP W/O HYPERTENSION: Disease Monitoring: Blood pressure averages 122/ 80  Chest pain, palpitations- no       Dyspnea- no Medications: Compliance- no meds  Lightheadedness,Syncope- no    Edema- no  PMH OF FASTING HYPERGLYCEMIA: Disease Monitoring: Blood Sugar ranges-not monitored  Polyuria/phagia/dipsia- no       Visual problems- no  ROS: Abd pain, bowel changes- chronic constipation from narcotics   Muscle aches- no    ROS See HPI above   PMH Smoking Status noted         Objective:   Physical Exam Gen.: Thin but well-nourished in appearance. Alert, appropriate and cooperative throughout exam. Appears younger than age Head: Normocephalic without obvious abnormalities  Eyes: No corneal or conjunctival inflammation noted. Pupils equal round reactive to light and accommodation. Fundal exam is benign without hemorrhages, exudate, papilledema. Extraocular motion intact. Vision grossly normal in OS; reading lens worn on OD. Ears: External  ear exam reveals no significant lesions or deformities. Canals clear .TMs normal. Hearing decreased bilaterally; aid worn on L. Nose: External nasal exam reveals no deformity or inflammation. Nasal mucosa are pink and moist. No lesions or exudates noted.   Mouth: Oral mucosa and oropharynx reveal no lesions or exudates. Teeth in good repair. Neck: No deformities, masses, or tenderness noted. Range of motion normal. Thyroid small w/o nodules. Lungs: Normal respiratory effort; chest expands symmetrically. Lungs are clear to auscultation without rales, wheezes, or increased work of  breathing. Heart: Normal rate and rhythm. Normal S1 and S2. No gallop, click, or rub. Grade 1/6 systolic  murmur. Abdomen: Bowel sounds normal; abdomen soft and nontender. No masses, organomegaly or hernias noted. Genitalia: periodic PAP smears   .                                                                                   Musculoskeletal/extremities: No deformity or scoliosis noted of  the thoracic or lumbar spine. No clubbing, cyanosis, edema, or deformity noted. Range of motion  normal .Tone & strength  Normal.Joints:mild OA hand changes. Nail health  Good. Dupuytren's bilaterally. Mild crepitus of knees Vascular: Carotid, radial artery, dorsalis pedis and  posterior tibial pulses are full and equal. No bruits present. Neurologic: Alert and oriented x3. Deep tendon reflexes symmetrical and normal.          Skin: Intact without suspicious lesions or rashes. Lymph: No cervical, axillary lymphadenopathy present. Psych: Mood and affect are normal. Normally interactive  Assessment & Plan:  #1 comprehensive physical exam; no acute findings #2 see Problem List with Assessments & Recommendations Plan: see Orders

## 2011-07-26 ENCOUNTER — Other Ambulatory Visit: Payer: Self-pay | Admitting: Internal Medicine

## 2011-07-26 DIAGNOSIS — R7309 Other abnormal glucose: Secondary | ICD-10-CM

## 2011-07-26 DIAGNOSIS — Z0000 Encounter for general adult medical examination without abnormal findings: Secondary | ICD-10-CM

## 2011-07-29 ENCOUNTER — Other Ambulatory Visit (INDEPENDENT_AMBULATORY_CARE_PROVIDER_SITE_OTHER): Payer: PRIVATE HEALTH INSURANCE

## 2011-07-29 DIAGNOSIS — R7309 Other abnormal glucose: Secondary | ICD-10-CM

## 2011-07-29 DIAGNOSIS — Z0000 Encounter for general adult medical examination without abnormal findings: Secondary | ICD-10-CM

## 2011-07-29 LAB — LIPID PANEL
Cholesterol: 191 mg/dL (ref 0–200)
LDL Cholesterol: 91 mg/dL (ref 0–99)
Total CHOL/HDL Ratio: 2
Triglycerides: 100 mg/dL (ref 0.0–149.0)

## 2011-07-29 LAB — BASIC METABOLIC PANEL
BUN: 15 mg/dL (ref 6–23)
Calcium: 10.1 mg/dL (ref 8.4–10.5)
Chloride: 101 mEq/L (ref 96–112)
Creatinine, Ser: 0.8 mg/dL (ref 0.4–1.2)

## 2011-07-29 LAB — CBC WITH DIFFERENTIAL/PLATELET
Basophils Absolute: 0 10*3/uL (ref 0.0–0.1)
Eosinophils Absolute: 0.1 10*3/uL (ref 0.0–0.7)
HCT: 39.3 % (ref 36.0–46.0)
Hemoglobin: 13.1 g/dL (ref 12.0–15.0)
Lymphs Abs: 2.5 10*3/uL (ref 0.7–4.0)
MCHC: 33.4 g/dL (ref 30.0–36.0)
Monocytes Absolute: 0.6 10*3/uL (ref 0.1–1.0)
Neutro Abs: 4.3 10*3/uL (ref 1.4–7.7)
Platelets: 272 10*3/uL (ref 150.0–400.0)
RDW: 14 % (ref 11.5–14.6)

## 2011-07-29 LAB — HEPATIC FUNCTION PANEL
AST: 22 U/L (ref 0–37)
Alkaline Phosphatase: 58 U/L (ref 39–117)
Bilirubin, Direct: 0.1 mg/dL (ref 0.0–0.3)
Total Protein: 7.1 g/dL (ref 6.0–8.3)

## 2011-07-29 LAB — TSH: TSH: 1.15 u[IU]/mL (ref 0.35–5.50)

## 2012-01-23 ENCOUNTER — Encounter: Payer: Self-pay | Admitting: Internal Medicine

## 2012-01-23 ENCOUNTER — Ambulatory Visit (INDEPENDENT_AMBULATORY_CARE_PROVIDER_SITE_OTHER): Payer: PRIVATE HEALTH INSURANCE | Admitting: Internal Medicine

## 2012-01-23 VITALS — BP 110/66 | HR 71 | Temp 99.1°F | Wt 114.8 lb

## 2012-01-23 DIAGNOSIS — R509 Fever, unspecified: Secondary | ICD-10-CM

## 2012-01-23 DIAGNOSIS — G479 Sleep disorder, unspecified: Secondary | ICD-10-CM

## 2012-01-23 DIAGNOSIS — K5900 Constipation, unspecified: Secondary | ICD-10-CM

## 2012-01-23 DIAGNOSIS — J310 Chronic rhinitis: Secondary | ICD-10-CM

## 2012-01-23 DIAGNOSIS — R630 Anorexia: Secondary | ICD-10-CM

## 2012-01-23 LAB — POCT URINALYSIS DIPSTICK
Nitrite, UA: NEGATIVE
Protein, UA: NEGATIVE
Spec Grav, UA: <=1.005
Urobilinogen, UA: 0.2

## 2012-01-23 LAB — CBC WITH DIFFERENTIAL/PLATELET
Basophils Absolute: 0 10*3/uL (ref 0.0–0.1)
Basophils Relative: 0.1 % (ref 0.0–3.0)
Eosinophils Absolute: 0.1 10*3/uL (ref 0.0–0.7)
MCHC: 33.5 g/dL (ref 30.0–36.0)
MCV: 101.8 fl — ABNORMAL HIGH (ref 78.0–100.0)
Monocytes Absolute: 0.7 10*3/uL (ref 0.1–1.0)
Neutrophils Relative %: 61.5 % (ref 43.0–77.0)
Platelets: 275 10*3/uL (ref 150.0–400.0)
RDW: 13.3 % (ref 11.5–14.6)

## 2012-01-23 MED ORDER — FLUTICASONE PROPIONATE 50 MCG/ACT NA SUSP: 1.0000 | Freq: Two times a day (BID) | NASAL | Status: DC | PRN

## 2012-01-23 NOTE — Patient Instructions (Addendum)
Plain Mucinex for thick secretions ;force NON dairy fluids . Use a Neti pot daily as needed for sinus congestion; going from open side to congested side . Nasal cleansing in the shower as discussed. Make sure that all residual soap is removed to prevent irritation. Fluticasone 1 spray in each nostril twice a day as needed. Use the "crossover" technique as discussed. Plain Zyrtec @ bedtime  as needed for itchy eyes & sneezing. Please report any signs or symptoms of fever such as  cough, purulent nasal discharge, etc. Miralax every 3 rd day as needed for constipation. Please try to go on My Chart within the next 24 hours to allow me to release the results directly to you.

## 2012-01-23 NOTE — Progress Notes (Signed)
  Subjective:    Patient ID: Yolanda [name], female    DOB: 11-01-1933, 78 y.o.   MRN: [id number]  HPI Anorexia present 3-4 months ; the only treatment is increased calories.She mentions insomnia only as update; she believes it is due to her meds. Fever is new w/o localizing signs except chronic rhinitis.She denies nasal congestion/obstruction; nasal purulence; facial pain; anosmia; fatigue;  headache; halitosis; earache and dental pain.  Past medical history/family history/social history were all reviewed and updated. Pertinent data:Previously on allergy shots w/o benefit   Review of Systems  Nausea/Vomiting:no  Diarrhea: no Melena/BRBPR: no Hematemesis: no  Fever/Chills: not until today Dysuria/hematuria/pyuria: no  Rash: no  Wt loss: no NSAIDs/ASA: no Vaginal bleeding/pelvic pain: no   Insomnia Onset:lifelong pattern Pattern: Difficulty going to sleep:no Frequent awakening:yes, 4-5X Early awakening:yes Nightmares:no Abnormal leg movement:no Snoring:no Apnea:no Risk factors/sleep hygiene: Stimulants:no Daytime naps:yes Stress/anxiety:"can't shut mind down" Work/travel factors:intermittent stay 2-3 mos in [place] Impact: Daytime hypersomnolence:no  Motor vehicle accident/motor dysfunction:no Treatment to date/efficacy: no             Objective:   Physical Exam Gen.: Thin but  well-nourished in appearance. Alert, appropriate and cooperative throughout exam. Appears younger than stated age  Eyes: No corneal or conjunctival inflammation noted.  Ears: External  ear exam reveals no significant lesions or deformities. Canals clear .TMs normal. Hearing aids bilaterally. Nose: External nasal exam reveals no deformity or inflammation. Nasal mucosa are pink and moist. No lesions or exudates noted.   Mouth: Oral mucosa and oropharynx reveal no lesions or exudates. Teeth in good repair. Neck: No deformities, masses, or tenderness noted. Thyroid normal. Lungs: Normal  respiratory effort; chest expands symmetrically. Lungs are clear to auscultation without rales, wheezes, or increased work of breathing. Heart: Normal rate and rhythm. Normal S1 and S2. No gallop, click, or rub. Grade 1/6 systolic murmur  Abdomen: Bowel sounds normal; abdomen soft and nontender. No masses, organomegaly or hernias noted. Musculoskeletal/extremities: Dupuytrens' of palms. No clubbing, cyanosis, edema, or deformity noted. Using 4 pronged  Cane. Vascular: Carotid, radial artery, dorsalis pedis and  posterior tibial pulses are full and equal. No bruits present. Neurologic: Alert and oriented x3. Deep tendon reflexes symmetrical and normal.          Skin: Intact without suspicious lesions or rashes. Lymph: No cervical, axillary lymphadenopathy present. Psych: Mood and affect are normal. Normally interactive                                                                                         Assessment & Plan:   #1 anorexia #2 constipation #3 rhinitis #4 chronic sleep disorder #5 fever Plan: See orders and recommendations

## 2012-01-27 ENCOUNTER — Encounter: Payer: Self-pay | Admitting: *Deleted

## 2012-02-14 ENCOUNTER — Telehealth: Payer: Self-pay | Admitting: *Deleted

## 2012-02-14 NOTE — Telephone Encounter (Signed)
Pt requesting labs be faxed to Dr Caleen Essex [phone]. Pt aware labs faxed

## 2012-11-03 ENCOUNTER — Other Ambulatory Visit: Payer: Self-pay | Admitting: Physical Medicine and Rehabilitation

## 2012-11-09 ENCOUNTER — Other Ambulatory Visit: Payer: PRIVATE HEALTH INSURANCE

## 2012-11-25 ENCOUNTER — Inpatient Hospital Stay: Admission: RE | Admit: 2012-11-25 | Payer: PRIVATE HEALTH INSURANCE | Source: Ambulatory Visit

## 2012-11-25 ENCOUNTER — Other Ambulatory Visit: Payer: PRIVATE HEALTH INSURANCE

## 2013-03-08 ENCOUNTER — Ambulatory Visit
Admission: RE | Admit: 2013-03-08 | Discharge: 2013-03-08 | Disposition: A | Discharge status: Home / Self Care | Payer: PRIVATE HEALTH INSURANCE | Source: Ambulatory Visit | Attending: Physical Medicine and Rehabilitation | Admitting: Physical Medicine and Rehabilitation

## 2013-03-08 DIAGNOSIS — M25512 Pain in left shoulder: Secondary | ICD-10-CM

## 2013-03-08 MED ORDER — IOHEXOL 180 MG/ML  SOLN
15.0000 mL | Freq: Once | INTRAMUSCULAR | Status: AC | PRN
  Administered 2013-03-08: 15 mL via INTRA_ARTICULAR

## 2014-04-29 ENCOUNTER — Encounter: Payer: Self-pay | Admitting: Internal Medicine

## 2014-12-16 ENCOUNTER — Encounter: Payer: Self-pay | Admitting: Gastroenterology

## 2014-12-20 ENCOUNTER — Encounter: Payer: Self-pay | Admitting: Internal Medicine

## 2015-06-22 ENCOUNTER — Telehealth: Payer: Self-pay | Admitting: Internal Medicine

## 2015-06-22 NOTE — Telephone Encounter (Signed)
F/U appointment OK

## 2015-06-22 NOTE — Telephone Encounter (Signed)
Pt called request to see Dr. Linna [name] one last time for a check up. Last office visit was 01/2012, inform pt that Dr. Linna [name] is retiring but pt still wan to see Dr. Linna [name] one last time because she saw Dr. Linna [name] for along time. Please advise  Call back # [phone]

## 2015-06-23 ENCOUNTER — Ambulatory Visit: Payer: PRIVATE HEALTH INSURANCE | Admitting: Internal Medicine

## 2015-06-23 DIAGNOSIS — R4689 Other symptoms and signs involving appearance and behavior: Secondary | ICD-10-CM | POA: Insufficient documentation

## 2015-06-23 DIAGNOSIS — Z029 Encounter for administrative examinations, unspecified: Secondary | ICD-10-CM

## 2015-11-08 ENCOUNTER — Encounter: Payer: Self-pay | Admitting: Medical

## 2015-11-08 ENCOUNTER — Ambulatory Visit (INDEPENDENT_AMBULATORY_CARE_PROVIDER_SITE_OTHER): Payer: 59 | Admitting: Medical

## 2015-11-08 VITALS — BP 118/68 | HR 74 | Temp 98.1°F | Ht 62.5 in | Wt 116.4 lb

## 2015-11-08 DIAGNOSIS — M549 Dorsalgia, unspecified: Secondary | ICD-10-CM

## 2015-11-08 DIAGNOSIS — G8929 Other chronic pain: Secondary | ICD-10-CM

## 2015-11-08 DIAGNOSIS — I517 Cardiomegaly: Secondary | ICD-10-CM | POA: Diagnosis not present

## 2015-11-08 DIAGNOSIS — R030 Elevated blood-pressure reading, without diagnosis of hypertension: Secondary | ICD-10-CM

## 2015-11-08 DIAGNOSIS — Z01818 Encounter for other preprocedural examination: Secondary | ICD-10-CM | POA: Diagnosis not present

## 2015-11-08 LAB — TROPONIN I: TNIDX: 0.01 ug/L (ref 0.00–0.06)

## 2015-11-08 NOTE — Progress Notes (Signed)
Subjective:    Patient ID: Yolanda [name], female    DOB: 13-May-1934, 82 y.o.   MRN: [id number]  HPI  I have reviewed pt PMH, PSH, FH, Social History and Surgical History.  Pt states dental procedure in the past under conscious sedation. Pt has appointment scheduled for next Wednesday. Pt has some bridgework to be done. It will be conciouss sedation again. Pt had some concious sedation 3 years ago with no complications. General anesthesia 10 years ago for back surgery. No reported reactions to anesthesia.   Pt states history of cva early 44's. No hx of MI.   Pt has remote hx of remote possible asthma related allergies 20 years ago. But then never used inhalers briefly for no  signigicant time. No copd. Quite smoking 30 years ago.  No history of arrythmia. But no ekg done that I can find in chart. .But pt and family member state she has had some done before. They are impression Dr. Linna Darner ordered ekg before.I looked in media, ekg, CV Procedures and CV strips. No ekg found.  Pt has not had any labs done recently.    Review of Systems  Constitutional: Negative for fever, chills and fatigue.  Respiratory: Negative for cough, chest tightness, shortness of breath and wheezing.   Cardiovascular: Negative for chest pain and palpitations.  Gastrointestinal: Negative for abdominal pain.  Musculoskeletal: Positive for back pain.  Skin: Negative for rash.  Neurological: Negative for dizziness and headaches.  Hematological: Negative for adenopathy. Does not bruise/bleed easily.  Psychiatric/Behavioral: Negative for behavioral problems and confusion.   Past Medical History  Diagnosis Date  . Fibroids     Lupron shots  . Premature menopause     due to Lupron  . 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 High altitude sickness 2008  . Anxiety disorder     panic attacks, PMH of. Dr  Janna Arch  . Adenomatous colon polyp     tubular  .  Diverticulosis     Social History   Social History  . Marital Status: Single    Spouse Name: N/A  . Number of Children: N/A  . Years of Education: N/A   Occupational History  . Not on file.   Social History Main Topics  . Smoking status: Former Smoker    Quit date: 08/19/1978  . Smokeless tobacco: Not on file     Comment: 25 years ago as of 2012  . Alcohol Use: 4.2 oz/week    7 Glasses of wine per week     Comment: Wine with dinner   . Drug Use: No  . Sexual Activity: Not on file   Other Topics Concern  . Not on file   Social History Narrative    Past Surgical History  Procedure Laterality Date  . Breast lumpectomy  1984    radiation , R breast  . Mastectomy  1990    R breast  . Mastectomy  1996    L  . Cesarean section      X 2  . Laminectomy      X3 L-Sspine  . Colonoscopy with polypectomy  2011    Dr  Delfin Edis    Family History  Problem Relation Age of Onset  . Asthma Maternal Grandmother   . Coronary artery disease Mother     dysrrhythmia  . Drug abuse Brother   . Cancer Neg Hx     No Known  Allergies  Current Outpatient Prescriptions on File Prior to Visit  Medication Sig Dispense Refill  . oxyCODONE (OXYCONTIN) 15 MG TB12 Take 15 mg by mouth every 12 (twelve) hours. Reported on 11/08/2015     No current facility-administered medications on file prior to visit.    BP 118/68 mmHg  Pulse 74  Temp(Src) 98.1 F (36.7 C) (Oral)  Ht 5' 2.5" (1.588 m)  Wt 116 lb 6.4 oz (52.799 kg)  BMI 20.94 kg/m2  SpO2 94%       Objective:   Physical Exam  General Mental Status- Alert. General Appearance- Not in acute distress. But when moves/changes position appears to be in pain.  Skin General: Color- Normal Color. Moisture- Normal Moisture. No brusing.  Neck Carotid Arteries- Normal color. Moisture- Normal Moisture. No carotid bruits. No JVD.  Chest and Lung Exam Auscultation: Breath Sounds:-Normal.  CTA  Cardiovascular Auscultation:Rythm- Regular,Rate and rhythm. Murmurs & Other Heart Sounds:Auscultation of the heart reveals- No Murmurs.  Abdomen Inspection:-Inspeection Normal. Palpation/Percussion:Note:No mass. Palpation and Percussion of the abdomen reveal- Non Tender, Non Distended + BS, no rebound or guarding.    Neurologic Cranial Nerve exam:- CN III-XII intact(No nystagmus), symmetric smile. Strength:- 5/5 equal and symmetric strength both upper and lower extremities.  Lower ext- negative homans signs.      Assessment & Plan:  [phone]. [name].  V1 and v2 appear to show t wave abnormality. I don't have old ekg to compare to. I will discuss this with Dr. Etter Sjogren and review labs with her before deciding on if will fill out dental forms.  For your pre-anesthesia assessment I did ekg today and will get labs today. I will need to review studies before making decision on clearance for the procedure. I expect labs to be back by Friday. We will call you with the results.   Continue medication for back pain.  Follow up date to be determined after lab review.   Pt has no idea where piror old ekg could be. Old MD Fredderick Erb retired. Mr. [name] agrees stating he has no idea where any ekg could be found. Offers no names of any doctors or clinics that she has been to. Sounds like she has lived in various locations and [place] may have been where a lot of her healthcare had been done.

## 2015-11-08 NOTE — Patient Instructions (Addendum)
For your pre-anesthesia assessment I did ekg today and will get labs today. I will need to review studies before making decision on clearance for the procedure. I expect labs to be back by Friday. We will call you with the results.   Continue medication for back pain.  Follow up date to be determined after lab review.

## 2015-11-08 NOTE — Progress Notes (Signed)
Pre visit review using our clinic review tool, if applicable. No additional management support is needed unless otherwise documented below in the visit note. 

## 2015-11-09 LAB — CBC WITH DIFFERENTIAL/PLATELET
BASOS ABS: 0.1 10*3/uL (ref 0.0–0.1)
Basophils Relative: 0.7 % (ref 0.0–3.0)
EOS ABS: 0.1 10*3/uL (ref 0.0–0.7)
Eosinophils Relative: 0.8 % (ref 0.0–5.0)
HEMATOCRIT: 43.6 % (ref 36.0–46.0)
Hemoglobin: 14.5 g/dL (ref 12.0–15.0)
LYMPHS PCT: 18.8 % (ref 12.0–46.0)
Lymphs Abs: 1.7 10*3/uL (ref 0.7–4.0)
MCHC: 33.2 g/dL (ref 30.0–36.0)
MCV: 99.9 fl (ref 78.0–100.0)
MONOS PCT: 3.8 % (ref 3.0–12.0)
Monocytes Absolute: 0.3 10*3/uL (ref 0.1–1.0)
NEUTROS ABS: 6.8 10*3/uL (ref 1.4–7.7)
NEUTROS PCT: 75.9 % (ref 43.0–77.0)
PLATELETS: 270 10*3/uL (ref 150.0–400.0)
RBC: 4.36 Mil/uL (ref 3.87–5.11)
RDW: 13.6 % (ref 11.5–15.5)
WBC: 8.9 10*3/uL (ref 4.0–10.5)

## 2015-11-09 LAB — COMPREHENSIVE METABOLIC PANEL
ALBUMIN: 4.1 g/dL (ref 3.5–5.2)
ALK PHOS: 53 U/L (ref 39–117)
ALT: 13 U/L (ref 0–35)
AST: 17 U/L (ref 0–37)
BUN: 18 mg/dL (ref 6–23)
CHLORIDE: 101 meq/L (ref 96–112)
CO2: 31 mEq/L (ref 19–32)
Calcium: 10 mg/dL (ref 8.4–10.5)
Creatinine, Ser: 0.98 mg/dL (ref 0.40–1.20)
GFR: 58.09 mL/min — AB (ref >60.00)
Glucose, Bld: 103 mg/dL — ABNORMAL HIGH (ref 70–99)
POTASSIUM: 4.3 meq/L (ref 3.5–5.1)
SODIUM: 138 meq/L (ref 135–145)
TOTAL PROTEIN: 7.3 g/dL (ref 6.0–8.3)
Total Bilirubin: 0.3 mg/dL (ref 0.2–1.2)

## 2015-11-13 ENCOUNTER — Telehealth: Payer: Self-pay | Admitting: Medical

## 2015-11-13 DIAGNOSIS — R9431 Abnormal electrocardiogram [ECG] [EKG]: Secondary | ICD-10-CM

## 2015-11-13 NOTE — Telephone Encounter (Signed)
Pt says that her son was told that she need a EKG. Pt says that she had one last week when in at her appt. Pt would like to double check to confirm. Please assist further.    CB: [phone]

## 2015-11-13 NOTE — Telephone Encounter (Signed)
Left message on son's cell Ilona Sorrel [phone] that we could only get pt's mom in with cariology next Monday morning. Son was advised to call back with any questions and advised him that pcp was working to get the surgical clearance form filled out as soon as possible.

## 2015-11-15 ENCOUNTER — Telehealth: Payer: Self-pay | Admitting: Medical

## 2015-11-15 NOTE — Telephone Encounter (Signed)
Also make copy of form so we can scan.

## 2015-11-15 NOTE — Telephone Encounter (Signed)
I filled out pt form for dental procedure. I am placing it in blue folder. On form it says attending physician. Would you get Dr. Etter Sjogren to [place]. Pt is seeing cardiologist on Monday to get cardiac clearance. When they clear she should be good to go.

## 2015-11-20 ENCOUNTER — Ambulatory Visit: Payer: PRIVATE HEALTH INSURANCE | Admitting: Internal Medicine

## 2015-11-24 ENCOUNTER — Encounter: Payer: Self-pay | Admitting: Internal Medicine

## 2015-11-24 ENCOUNTER — Ambulatory Visit (INDEPENDENT_AMBULATORY_CARE_PROVIDER_SITE_OTHER): Payer: 59 | Admitting: Internal Medicine

## 2015-11-24 VITALS — BP 132/66 | HR 69 | Ht 62.0 in | Wt 117.6 lb

## 2015-11-24 DIAGNOSIS — Z01810 Encounter for preprocedural cardiovascular examination: Secondary | ICD-10-CM

## 2015-11-24 DIAGNOSIS — R9431 Abnormal electrocardiogram [ECG] [EKG]: Secondary | ICD-10-CM | POA: Diagnosis not present

## 2015-11-24 NOTE — Patient Instructions (Signed)
Your physician has requested that you have an echocardiogram @ [address] - [address]rd [address]. Echocardiography is a painless test that uses sound waves to create images of your heart. It provides your doctor with information about the size and shape of your heart and how well your heart's chambers and valves are working. This procedure takes approximately one hour. There are no restrictions for this procedure.  Your physician recommends that you schedule a follow-up appointment as needed - we will call you with the test results.

## 2015-11-24 NOTE — Progress Notes (Signed)
OFFICE NOTE  Chief Complaint:  Abnormal ekg, pre-op  Primary Care Physician: Mackie Pai, PA-C  HPI:  Yolanda [name] is a 82 y.o. female who presents today for evaluation of an abnormal EKG. Recently she had an EKG performed in the office which demonstrated poor anterior R-wave progression and possible anterior MI. We repeated EKG in the office today and there is no clear evidence of that, however she does have high voltage particularly in the lateral leads which meet criteria for LVH by voltage. [name] has a history of stroke which was remote in the 29s. The etiology of it is not clear and she was on aspirin for. At time but discontinued it. She also had 3 recurrences of breast cancer and underwent bilateral mastectomy. Other than that she take some chronic oxycodone as needed for pain and Prozac, but no other medications. She does not have a history of hypertension or diabetes. There is no significant heart disease in her family. She's planning on undergoing some or extensive dental work which is done apparently under moderate sedation. We received and anesthesia clearance for that procedure today. She denies chest pain, shortness of breath, palpitations, presyncope or syncopal symptoms.  PMHx:  Past Medical History  Diagnosis Date  . Fibroids     Lupron shots  . Premature menopause     due to Lupron  . 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 High altitude sickness 2008  . Anxiety disorder     panic attacks, PMH of. Dr  Janna Arch  . Adenomatous colon polyp     tubular  . Diverticulosis     Past Surgical History  Procedure Laterality Date  . Breast lumpectomy  1984    radiation , R breast  . Mastectomy  1990    R breast  . Mastectomy  1996    L  . Cesarean section      X 2  . Laminectomy      X3 L-Sspine  . Colonoscopy with polypectomy  2011    Dr  Delfin Edis    FAMHx:  Family History  Problem Relation  Age of Onset  . Asthma Maternal Grandmother   . Coronary artery disease Mother     dysrrhythmia  . Drug abuse Brother   . Cancer Neg Hx     SOCHx:   reports that she quit smoking about 37 years ago. She does not have any smokeless tobacco history on file. She reports that she drinks about 4.2 oz of alcohol per week. She reports that she does not use illicit drugs.  ALLERGIES:  No Known Allergies  ROS: Pertinent items noted in HPI and remainder of comprehensive ROS otherwise negative.  HOME MEDS: Current Outpatient Prescriptions  Medication Sig Dispense Refill  . FLUoxetine (PROZAC) 20 MG capsule Take 20 mg by mouth daily.    . Lactobacillus-Inulin (Parc) Take by mouth.    . Oxycodone HCl 10 MG TABS Take 1 tablet by mouth every 6 (six) hours as needed.  0   No current facility-administered medications for this visit.    LABS/IMAGING: No results found for this or any previous visit (from the past 48 hour(s)). No results found.  WEIGHTS: Wt Readings from Last 3 Encounters:  11/24/15 117 lb 9.6 oz (53.343 kg)  11/08/15 116 lb 6.4 oz (52.799 kg)  01/23/12 114 lb 12.8 oz (52.073 kg)    VITALS: BP 132/66  mmHg  Pulse 69  Ht 5\' 2"  (1.575 m)  Wt 117 lb 9.6 oz (53.343 kg)  BMI 21.50 kg/m2  EXAM: General appearance: alert and no distress Neck: no carotid bruit, no JVD and thyroid not enlarged, symmetric, no tenderness/mass/nodules Lungs: clear to auscultation bilaterally Heart: regular rate and rhythm, S1, S2 normal, no murmur, click, rub or gallop Abdomen: soft, non-tender; bowel sounds normal; no masses,  no organomegaly Extremities: extremities normal, atraumatic, no cyanosis or edema and Status post bilateral mastectomy Pulses: 2+ and symmetric Skin: Skin color, texture, turgor normal. No rashes or lesions Neurologic: Grossly normal Psych: Pleasant  EKG: Normal sinus rhythm at 69, voltage criteria for LVH in the lateral  leads  ASSESSMENT: 1. Likely low preoperative risk for dental procedure 2. Possible LVH by voltage-however suspect high voltage is related to mastectomy and short distance between the EKG leads and her heart  PLAN: 1.   [name] is likely low preoperative risk for her dental procedure. There is question of LVH by voltage on her EKG however I think is related to mastectomy and a short distance between her EKG leads in her heart. LV wall thickness may be normal but I recommend an echocardiogram to further evaluate that. She has no history of hypertension although could have genetic cardiomyopathy. If this is negative then she would certainly be at low risk and not need any further cardiac workup. Plan to schedule 2-D echo in the near future and I will contact her dentist Dr. Mariel Sleet with those results for clearance.  Pixie Casino, MD, [facility] Attending Cardiologist [place] 11/24/2015, 12:12 PM

## 2015-11-27 ENCOUNTER — Ambulatory Visit (HOSPITAL_COMMUNITY): Payer: 59 | Attending: Cardiovascular Disease

## 2015-11-27 ENCOUNTER — Other Ambulatory Visit: Payer: Self-pay

## 2015-11-27 DIAGNOSIS — Z01810 Encounter for preprocedural cardiovascular examination: Secondary | ICD-10-CM | POA: Diagnosis not present

## 2015-11-27 DIAGNOSIS — R9431 Abnormal electrocardiogram [ECG] [EKG]: Secondary | ICD-10-CM | POA: Diagnosis not present

## 2015-11-27 DIAGNOSIS — I517 Cardiomegaly: Secondary | ICD-10-CM | POA: Diagnosis not present

## 2015-11-27 DIAGNOSIS — I351 Nonrheumatic aortic (valve) insufficiency: Secondary | ICD-10-CM | POA: Insufficient documentation

## 2015-11-29 ENCOUNTER — Telehealth: Payer: Self-pay | Admitting: Internal Medicine

## 2015-11-29 NOTE — Telephone Encounter (Signed)
Clearance note and MD OV note faxed

## 2015-11-29 NOTE — Telephone Encounter (Signed)
I provided clearance for this already. Low risk.  Dr. Lemmie Evens

## 2015-11-29 NOTE — Telephone Encounter (Signed)
New message     1. What dental office are you calling from? Dr. Posey Pronto   2. What is your office phone and fax number? [phone] / fax fax [phone]  3. What type of procedure is the patient having performed?  Crown and bridge work    4. What date is procedure scheduled? Pending    5. What is your question (ex. Antibiotics prior to procedure, holding medication-we need to know how long dentist wants pt to hold med)? No

## 2015-12-06 ENCOUNTER — Telehealth: Payer: Self-pay | Admitting: Medical

## 2015-12-06 NOTE — Telephone Encounter (Signed)
I think pt already had her dental procedure done. Looked cardiologist faxed over clearance form. Not sure if we need to fax over our form that we filled out. Wanted to let you know they may call and ask for our form.

## 2015-12-07 NOTE — Telephone Encounter (Signed)
Per note from Cardiologist, Lyman Bishop gave clearance for pt to have the dental procedure.

## 2017-01-21 ENCOUNTER — Telehealth: Payer: Self-pay | Admitting: Medical

## 2017-01-21 NOTE — Telephone Encounter (Signed)
I reviewed pt health care power of attorney forms. I placed them on your desk. Do these get placed in scanned document like all other documents. If so will you place. Just want to double check.

## 2017-01-22 NOTE — Telephone Encounter (Signed)
Yes. Place forms in to be scanned basket.

## 2017-01-23 ENCOUNTER — Telehealth: Payer: Self-pay | Admitting: *Deleted

## 2017-01-23 NOTE — Telephone Encounter (Signed)
Received request for Medical records from [facility], forwarded to [place] for email/scan/SLS 06/07

## 2017-02-07 ENCOUNTER — Emergency Department (HOSPITAL_COMMUNITY): Payer: 59

## 2017-02-07 ENCOUNTER — Observation Stay (HOSPITAL_COMMUNITY): Payer: 59

## 2017-02-07 ENCOUNTER — Inpatient Hospital Stay (HOSPITAL_COMMUNITY)
Admission: EM | Admit: 2017-02-07 | Discharge: 2017-02-20 | DRG: 208 | Disposition: A | Discharge status: Skilled Nursing Facility | Payer: 59 | Attending: Internal Medicine | Admitting: Internal Medicine

## 2017-02-07 ENCOUNTER — Encounter (HOSPITAL_COMMUNITY): Payer: Self-pay | Admitting: Emergency Medicine

## 2017-02-07 DIAGNOSIS — J209 Acute bronchitis, unspecified: Principal | ICD-10-CM | POA: Diagnosis present

## 2017-02-07 DIAGNOSIS — W19XXXA Unspecified fall, initial encounter: Secondary | ICD-10-CM | POA: Diagnosis present

## 2017-02-07 DIAGNOSIS — Z7401 Bed confinement status: Secondary | ICD-10-CM

## 2017-02-07 DIAGNOSIS — J9600 Acute respiratory failure, unspecified whether with hypoxia or hypercapnia: Secondary | ICD-10-CM

## 2017-02-07 DIAGNOSIS — R531 Weakness: Secondary | ICD-10-CM

## 2017-02-07 DIAGNOSIS — J9621 Acute and chronic respiratory failure with hypoxia: Secondary | ICD-10-CM | POA: Diagnosis present

## 2017-02-07 DIAGNOSIS — F1010 Alcohol abuse, uncomplicated: Secondary | ICD-10-CM | POA: Diagnosis present

## 2017-02-07 DIAGNOSIS — R040 Epistaxis: Secondary | ICD-10-CM

## 2017-02-07 DIAGNOSIS — E43 Unspecified severe protein-calorie malnutrition: Secondary | ICD-10-CM

## 2017-02-07 DIAGNOSIS — F418 Other specified anxiety disorders: Secondary | ICD-10-CM | POA: Diagnosis present

## 2017-02-07 DIAGNOSIS — J869 Pyothorax without fistula: Secondary | ICD-10-CM

## 2017-02-07 DIAGNOSIS — W1830XA Fall on same level, unspecified, initial encounter: Secondary | ICD-10-CM | POA: Diagnosis present

## 2017-02-07 DIAGNOSIS — Z09 Encounter for follow-up examination after completed treatment for conditions other than malignant neoplasm: Secondary | ICD-10-CM

## 2017-02-07 DIAGNOSIS — I110 Hypertensive heart disease with heart failure: Secondary | ICD-10-CM | POA: Diagnosis present

## 2017-02-07 DIAGNOSIS — R079 Chest pain, unspecified: Secondary | ICD-10-CM

## 2017-02-07 DIAGNOSIS — J40 Bronchitis, not specified as acute or chronic: Secondary | ICD-10-CM

## 2017-02-07 DIAGNOSIS — R4689 Other symptoms and signs involving appearance and behavior: Secondary | ICD-10-CM | POA: Diagnosis present

## 2017-02-07 DIAGNOSIS — M533 Sacrococcygeal disorders, not elsewhere classified: Secondary | ICD-10-CM | POA: Diagnosis not present

## 2017-02-07 DIAGNOSIS — D72829 Elevated white blood cell count, unspecified: Secondary | ICD-10-CM

## 2017-02-07 DIAGNOSIS — D638 Anemia in other chronic diseases classified elsewhere: Secondary | ICD-10-CM | POA: Diagnosis present

## 2017-02-07 DIAGNOSIS — Z9889 Other specified postprocedural states: Secondary | ICD-10-CM

## 2017-02-07 DIAGNOSIS — J9601 Acute respiratory failure with hypoxia: Secondary | ICD-10-CM

## 2017-02-07 DIAGNOSIS — Z9119 Patient's noncompliance with other medical treatment and regimen: Secondary | ICD-10-CM

## 2017-02-07 DIAGNOSIS — I509 Heart failure, unspecified: Secondary | ICD-10-CM | POA: Diagnosis present

## 2017-02-07 DIAGNOSIS — J90 Pleural effusion, not elsewhere classified: Secondary | ICD-10-CM

## 2017-02-07 DIAGNOSIS — R0602 Shortness of breath: Secondary | ICD-10-CM

## 2017-02-07 DIAGNOSIS — Z4682 Encounter for fitting and adjustment of non-vascular catheter: Secondary | ICD-10-CM

## 2017-02-07 DIAGNOSIS — Z79899 Other long term (current) drug therapy: Secondary | ICD-10-CM

## 2017-02-07 DIAGNOSIS — F1020 Alcohol dependence, uncomplicated: Secondary | ICD-10-CM | POA: Diagnosis present

## 2017-02-07 DIAGNOSIS — R0902 Hypoxemia: Secondary | ICD-10-CM

## 2017-02-07 DIAGNOSIS — Z4659 Encounter for fitting and adjustment of other gastrointestinal appliance and device: Secondary | ICD-10-CM

## 2017-02-07 DIAGNOSIS — Z8673 Personal history of transient ischemic attack (TIA), and cerebral infarction without residual deficits: Secondary | ICD-10-CM

## 2017-02-07 DIAGNOSIS — S322XXA Fracture of coccyx, initial encounter for closed fracture: Secondary | ICD-10-CM | POA: Diagnosis present

## 2017-02-07 LAB — CBC
HEMATOCRIT: 36.9 % (ref 36.0–46.0)
Hemoglobin: 12.2 g/dL (ref 12.0–15.0)
MCH: 32.6 pg (ref 26.0–34.0)
MCHC: 33.1 g/dL (ref 30.0–36.0)
MCV: 98.7 fL (ref 78.0–100.0)
Platelets: 510 10*3/uL — ABNORMAL HIGH (ref 150–400)
RBC: 3.74 MIL/uL — ABNORMAL LOW (ref 3.87–5.11)
RDW: 13.3 % (ref 11.5–15.5)
WBC: 20.3 10*3/uL — ABNORMAL HIGH (ref 4.0–10.5)

## 2017-02-07 LAB — I-STAT TROPONIN, ED: TROPONIN I, POC: 0 ng/mL (ref 0.00–0.08)

## 2017-02-07 LAB — HEPATIC FUNCTION PANEL
ALBUMIN: 2.7 g/dL — AB (ref 3.5–5.0)
ALT: 34 U/L (ref 14–54)
AST: 27 U/L (ref 15–41)
Alkaline Phosphatase: 73 U/L (ref 38–126)
Bilirubin, Direct: <0.1 mg/dL — ABNORMAL LOW (ref 0.1–0.5)
TOTAL PROTEIN: 6.5 g/dL (ref 6.5–8.1)
Total Bilirubin: 0.4 mg/dL (ref 0.3–1.2)

## 2017-02-07 LAB — URINALYSIS, ROUTINE W REFLEX MICROSCOPIC
BACTERIA UA: NONE SEEN
Bilirubin Urine: NEGATIVE
GLUCOSE, UA: NEGATIVE mg/dL
Ketones, ur: 5 mg/dL — AB
Leukocytes, UA: NEGATIVE
NITRITE: NEGATIVE
PROTEIN: 30 mg/dL — AB
Specific Gravity, Urine: 1.02 (ref 1.005–1.030)
pH: 5 (ref 5.0–8.0)

## 2017-02-07 LAB — ETHANOL

## 2017-02-07 LAB — RAPID URINE DRUG SCREEN, HOSP PERFORMED
Amphetamines: NOT DETECTED
BARBITURATES: NOT DETECTED
Benzodiazepines: NOT DETECTED
COCAINE: NOT DETECTED
Opiates: POSITIVE — AB
TETRAHYDROCANNABINOL: NOT DETECTED

## 2017-02-07 LAB — BASIC METABOLIC PANEL
Anion gap: 8 (ref 5–15)
BUN: 20 mg/dL (ref 6–20)
CALCIUM: 9 mg/dL (ref 8.9–10.3)
CO2: 27 mmol/L (ref 22–32)
Chloride: 99 mmol/L — ABNORMAL LOW (ref 101–111)
Creatinine, Ser: 0.63 mg/dL (ref 0.44–1.00)
GFR calc Af Amer: >60 mL/min (ref >60)
GLUCOSE: 108 mg/dL — AB (ref 65–99)
POTASSIUM: 4.1 mmol/L (ref 3.5–5.1)
Sodium: 134 mmol/L — ABNORMAL LOW (ref 135–145)

## 2017-02-07 LAB — I-STAT CG4 LACTIC ACID, ED: LACTIC ACID, VENOUS: 0.78 mmol/L (ref 0.5–1.9)

## 2017-02-07 LAB — PROTIME-INR
INR: 1.01
PROTHROMBIN TIME: 13.3 s (ref 11.4–15.2)

## 2017-02-07 LAB — TSH: TSH: 2.137 u[IU]/mL (ref 0.350–4.500)

## 2017-02-07 MED ORDER — ACETAMINOPHEN 325 MG PO TABS
650.0000 mg | ORAL_TABLET | Freq: Four times a day (QID) | ORAL | Status: DC | PRN
  Administered 2017-02-17: 325 mg via ORAL
  Administered 2017-02-19: 650 mg via ORAL
  Filled 2017-02-07 (×3): qty 2

## 2017-02-07 MED ORDER — LORAZEPAM 1 MG PO TABS
1.0000 mg | ORAL_TABLET | ORAL | Status: DC | PRN
  Administered 2017-02-08 – 2017-02-09 (×4): 1 mg via ORAL
  Filled 2017-02-07 (×5): qty 1

## 2017-02-07 MED ORDER — THIAMINE HCL 100 MG/ML IJ SOLN: 100.0000 mg | Freq: Every day | INTRAMUSCULAR | Status: DC

## 2017-02-07 MED ORDER — CHLORHEXIDINE GLUCONATE 0.12 % MT SOLN
15.0000 mL | Freq: Two times a day (BID) | OROMUCOSAL | Status: DC
  Administered 2017-02-08 – 2017-02-10 (×6): 15 mL via OROMUCOSAL
  Filled 2017-02-07 (×7): qty 15

## 2017-02-07 MED ORDER — IPRATROPIUM-ALBUTEROL 0.5-2.5 (3) MG/3ML IN SOLN: 3.0000 mL | Freq: Once | RESPIRATORY_TRACT | Status: DC

## 2017-02-07 MED ORDER — VITAMIN B-1 100 MG PO TABS
100.0000 mg | ORAL_TABLET | Freq: Every day | ORAL | Status: DC
  Administered 2017-02-07 – 2017-02-20 (×13): 100 mg via ORAL
  Filled 2017-02-07 (×13): qty 1

## 2017-02-07 MED ORDER — IPRATROPIUM-ALBUTEROL 0.5-2.5 (3) MG/3ML IN SOLN
3.0000 mL | Freq: Four times a day (QID) | RESPIRATORY_TRACT | Status: DC
  Administered 2017-02-08 (×2): 3 mL via RESPIRATORY_TRACT
  Filled 2017-02-07 (×2): qty 3

## 2017-02-07 MED ORDER — LORAZEPAM 2 MG/ML IJ SOLN
1.0000 mg | INTRAMUSCULAR | Status: DC | PRN
  Administered 2017-02-08 – 2017-02-11 (×2): 1 mg via INTRAVENOUS
  Filled 2017-02-07 (×3): qty 1

## 2017-02-07 MED ORDER — LEVOFLOXACIN IN D5W 500 MG/100ML IV SOLN: 500.0000 mg | Freq: Once | INTRAVENOUS | Status: DC

## 2017-02-07 MED ORDER — ONDANSETRON HCL 4 MG PO TABS
4.0000 mg | ORAL_TABLET | Freq: Four times a day (QID) | ORAL | Status: DC | PRN
  Administered 2017-02-16 – 2017-02-17 (×2): 4 mg via ORAL
  Filled 2017-02-07 (×2): qty 1

## 2017-02-07 MED ORDER — ORAL CARE MOUTH RINSE
15.0000 mL | Freq: Two times a day (BID) | OROMUCOSAL | Status: DC
  Administered 2017-02-09 – 2017-02-10 (×2): 15 mL via OROMUCOSAL

## 2017-02-07 MED ORDER — ADULT MULTIVITAMIN W/MINERALS CH
1.0000 | ORAL_TABLET | Freq: Every day | ORAL | Status: DC
  Administered 2017-02-07 – 2017-02-13 (×6): 1 via ORAL
  Filled 2017-02-07 (×6): qty 1

## 2017-02-07 MED ORDER — LORAZEPAM 2 MG/ML IJ SOLN: 1.0000 mg | Freq: Four times a day (QID) | INTRAMUSCULAR | Status: DC | PRN

## 2017-02-07 MED ORDER — ACETAMINOPHEN 650 MG RE SUPP: 650.0000 mg | Freq: Four times a day (QID) | RECTAL | Status: DC | PRN

## 2017-02-07 MED ORDER — POLYETHYLENE GLYCOL 3350 17 G PO PACK
17.0000 g | PACK | Freq: Every day | ORAL | Status: DC
  Administered 2017-02-08 – 2017-02-20 (×10): 17 g via ORAL
  Filled 2017-02-07 (×11): qty 1

## 2017-02-07 MED ORDER — PHENYLEPHRINE HCL 0.5 % NA SOLN
1.0000 [drp] | Freq: Once | NASAL | Status: AC
  Administered 2017-02-07: 1 [drp] via NASAL
  Filled 2017-02-07: qty 15

## 2017-02-07 MED ORDER — DEXTROSE 5 % IV SOLN
1.0000 g | INTRAVENOUS | Status: DC
  Administered 2017-02-07 – 2017-02-10 (×4): 1 g via INTRAVENOUS
  Filled 2017-02-07 (×4): qty 10

## 2017-02-07 MED ORDER — FOLIC ACID 1 MG PO TABS
1.0000 mg | ORAL_TABLET | Freq: Every day | ORAL | Status: DC
  Administered 2017-02-07 – 2017-02-20 (×13): 1 mg via ORAL
  Filled 2017-02-07 (×12): qty 1

## 2017-02-07 MED ORDER — SODIUM CHLORIDE 0.9 % IV SOLN: Freq: Once | INTRAVENOUS | Status: DC

## 2017-02-07 MED ORDER — LORAZEPAM 2 MG/ML IJ SOLN
1.0000 mg | Freq: Once | INTRAMUSCULAR | Status: AC
  Administered 2017-02-07: 1 mg via INTRAVENOUS
  Filled 2017-02-07: qty 1

## 2017-02-07 MED ORDER — TRAZODONE HCL 50 MG PO TABS
50.0000 mg | ORAL_TABLET | Freq: Every day | ORAL | Status: DC
  Administered 2017-02-07 – 2017-02-10 (×4): 50 mg via ORAL
  Filled 2017-02-07 (×4): qty 1

## 2017-02-07 MED ORDER — FLUOXETINE HCL 20 MG PO CAPS
20.0000 mg | ORAL_CAPSULE | Freq: Every day | ORAL | Status: DC
  Administered 2017-02-07 – 2017-02-20 (×14): 20 mg via ORAL
  Filled 2017-02-07 (×14): qty 1

## 2017-02-07 MED ORDER — IPRATROPIUM-ALBUTEROL 0.5-2.5 (3) MG/3ML IN SOLN
3.0000 mL | RESPIRATORY_TRACT | Status: DC
  Filled 2017-02-07 (×2): qty 3

## 2017-02-07 MED ORDER — HYDROCODONE-ACETAMINOPHEN 5-325 MG PO TABS
1.0000 | ORAL_TABLET | ORAL | Status: DC | PRN
  Administered 2017-02-07: 1 via ORAL
  Administered 2017-02-07 – 2017-02-14 (×14): 2 via ORAL
  Administered 2017-02-15: 1 via ORAL
  Administered 2017-02-15 – 2017-02-18 (×9): 2 via ORAL
  Administered 2017-02-18: 1 via ORAL
  Administered 2017-02-19 – 2017-02-20 (×4): 2 via ORAL
  Filled 2017-02-07 (×19): qty 2
  Filled 2017-02-07: qty 1
  Filled 2017-02-07 (×3): qty 2
  Filled 2017-02-07: qty 1
  Filled 2017-02-07: qty 2
  Filled 2017-02-07: qty 1
  Filled 2017-02-07 (×5): qty 2

## 2017-02-07 MED ORDER — ONDANSETRON HCL 4 MG/2ML IJ SOLN: 4.0000 mg | Freq: Four times a day (QID) | INTRAMUSCULAR | Status: DC | PRN

## 2017-02-07 MED ORDER — LORAZEPAM 2 MG/ML IJ SOLN: 1.0000 mg | Freq: Once | INTRAMUSCULAR | Status: DC

## 2017-02-07 MED ORDER — SODIUM CHLORIDE 0.9 % IV SOLN
INTRAVENOUS | Status: DC
  Administered 2017-02-07 – 2017-02-10 (×3): via INTRAVENOUS

## 2017-02-07 MED ORDER — OXYCODONE HCL 5 MG PO TABS
5.0000 mg | ORAL_TABLET | Freq: Four times a day (QID) | ORAL | Status: DC | PRN
  Administered 2017-02-08 – 2017-02-20 (×14): 5 mg via ORAL
  Filled 2017-02-07 (×14): qty 1

## 2017-02-07 MED ORDER — GUAIFENESIN-DM 100-10 MG/5ML PO SYRP: 5.0000 mL | ORAL_SOLUTION | ORAL | Status: DC | PRN

## 2017-02-07 MED ORDER — LORAZEPAM 1 MG PO TABS
1.0000 mg | ORAL_TABLET | Freq: Four times a day (QID) | ORAL | Status: DC | PRN
  Administered 2017-02-07: 1 mg via ORAL
  Filled 2017-02-07: qty 1

## 2017-02-07 MED ORDER — GUAIFENESIN ER 600 MG PO TB12
600.0000 mg | ORAL_TABLET | Freq: Two times a day (BID) | ORAL | Status: DC
  Administered 2017-02-07 – 2017-02-11 (×8): 600 mg via ORAL
  Filled 2017-02-07 (×9): qty 1

## 2017-02-07 MED ORDER — HEPARIN SODIUM (PORCINE) 5000 UNIT/ML IJ SOLN
5000.0000 [IU] | Freq: Three times a day (TID) | INTRAMUSCULAR | Status: DC
  Administered 2017-02-08 – 2017-02-09 (×4): 5000 [IU] via SUBCUTANEOUS
  Filled 2017-02-07 (×6): qty 1

## 2017-02-07 MED ORDER — AZITHROMYCIN 500 MG IV SOLR
500.0000 mg | INTRAVENOUS | Status: DC
  Administered 2017-02-07 – 2017-02-13 (×7): 500 mg via INTRAVENOUS
  Filled 2017-02-07 (×7): qty 500

## 2017-02-07 NOTE — Progress Notes (Signed)
Attempted to start neb tx's , but pt refused at this time c/o extreme pain. RN aware. Family in room, RT will continue to monitor.

## 2017-02-07 NOTE — ED Notes (Signed)
Pt placed on O2 2lpm via nasal cannula due to low O2 sats.

## 2017-02-07 NOTE — ED Provider Notes (Signed)
[facility] DEPT Provider Note   CSN: [id number] Arrival date & time: 02/07/17  1050     History   Chief Complaint Chief Complaint  Patient presents with  . Weakness    HPI Yolanda [name] is a 83 y.o. female.  HPI Patient lives in an apartment at [place] in independent living. Patient fell 5 weeks ago onto her buttock. Patient has had increasing general weakness and cough for approximately 2 weeks. She denies chest pain. No documented fever. She has had increasing difficulty with ambulation in her apartment. She is using a walker but having difficulty with getting to the restroom and having loss of continence. Patient's son is concerned for her self-care however the patient only sporadically allows staff into the apartment to assist her. Patient also reports she's had nosebleeds recurrently over the past several days. The left side of the nose bleeds copiously. She is not on any anticoagulants. They do hold pressure and eventually they have stopped. Patient reports she drinks 1 or 2 alcohol drinks in the evening. Patient's son reports that she drinks at least a bottle or a bottle and half of wine every day. She has not had a history of alcohol withdrawal but he thinks that she might experience withdrawal symptoms if she did not have her daily alcohol consumption. Past Medical History:  Diagnosis Date  . Adenomatous colon polyp    tubular  . Anxiety disorder    panic attacks, PMH of. Dr  Janna Arch  . 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 Diverticulosis   . Fibroids    Lupron shots  . High altitude sickness 2008  . Premature menopause    due to Lupron    Patient Active Problem List   Diagnosis Date Noted  . Leukocytosis   . Severe protein-calorie malnutrition ([place])   . Acute bronchitis 02/07/2017  . ETOH abuse 02/07/2017  . Fall 02/07/2017  . Coccyx pain 02/07/2017  . Abnormal EKG 11/24/2015  . Pre-operative  cardiovascular examination 11/24/2015  . Non-compliant behavior 06/23/2015  . ARTHRALGIA 05/14/2010  . SIGMOID POLYP 10/10/2009  . CARPAL TUNNEL SYNDROME 05/24/2009  . FASTING HYPERGLYCEMIA 05/24/2009  . ASTHMA 05/19/2008  . GERD 05/19/2008  . ELEVATED BLOOD PRESSURE WITHOUT DIAGNOSIS OF HYPERTENSION 05/19/2008  . TRIGGER FINGER, RIGHT MIDDLE 10/05/2007  . 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 BREAST LUMPECTOMY  1984   radiation , R breast  . CESAREAN SECTION     X 2  . colonoscopy with polypectomy  2011   Dr  Delfin Edis  . LAMINECTOMY     X3 L-Sspine  . MASTECTOMY  1990   R breast  . MASTECTOMY  1996   L    OB History    No data available       Home Medications    Prior to Admission medications   Medication Sig Start Date End Date Taking? Authorizing Provider  FLUoxetine (PROZAC) 20 MG capsule Take 20 mg by mouth daily.   Yes [provider]  naloxegol oxalate (MOVANTIK) 25 MG TABS tablet Take by mouth daily.   Yes [provider]  Oxycodone HCl 10 MG TABS Take 1 tablet by mouth every 6 (six) hours as needed. 11/15/15  Yes [provider]  traZODone (DESYREL) 50 MG tablet Take 50 mg by mouth at bedtime. 02/05/17  Yes [provider]    Family History Family History  Problem Relation Age of Onset  . Asthma Maternal Grandmother   . Coronary artery disease Mother        dysrrhythmia  . Drug abuse Brother   . Heart Problems Father   . Cancer Neg Hx     Social History Social History  Substance Use Topics  . Smoking status: Former Smoker    Quit date: 08/19/1978  . Smokeless tobacco: Never Used     Comment: 25 years ago as of 2012  . Alcohol use 5.4 oz/week    9 Glasses of wine per week     Comment: wine with dinner      Allergies   Patient has no known allergies.   Review of Systems Review of Systems 10 Systems reviewed and are negative for acute change  except as noted in the HPI.   Physical Exam Updated Vital Signs BP (!) 141/54 (BP Location: Left Arm)   Pulse (!) 110   Temp 99.1 F (37.3 C) (Oral)   Resp (!) 24   Ht 5\' 2"  (1.575 m)   Wt 48.1 kg (106 lb 0.7 oz)   SpO2 92%   BMI 19.40 kg/m   Physical Exam  Constitutional: She is oriented to person, place, and time. She appears well-developed and well-nourished. No distress.  HENT:  Head: Normocephalic and atraumatic.  Left nare has very superficial vessels on the septum. No active bleeding at this time. No clot in the posterior nasopharynx. Mucous membranes are pink and moist.  Eyes: Conjunctivae and EOM are normal. Pupils are equal, round, and reactive to light.  Neck: Neck supple.  Cardiovascular: Normal rate and regular rhythm.   No murmur heard. Pulmonary/Chest: Effort normal. No respiratory distress.  Occasional expiratory wheeze.  Abdominal: Soft. She exhibits no distension. There is no tenderness.  Musculoskeletal: Normal range of motion. She exhibits no edema or tenderness.  Neurological: She is alert and oriented to person, place, and time. No cranial nerve deficit. She exhibits normal muscle tone. Coordination normal.  Skin: Skin is warm and dry.  Psychiatric: She has a normal mood and affect.  Nursing note and vitals reviewed.    ED Treatments / Results  Labs (all labs ordered are listed, but only abnormal results are displayed) Labs Reviewed  BASIC METABOLIC PANEL - Abnormal; Notable for the following:       Result Value   Sodium 134 (*)    Chloride 99 (*)    Glucose, Bld 108 (*)    All other components within normal limits  CBC - Abnormal; Notable for the following:    WBC 20.3 (*)    RBC 3.74 (*)    Platelets 510 (*)    All other components within normal limits  URINALYSIS, ROUTINE W REFLEX MICROSCOPIC - Abnormal; Notable for the following:    Color, Urine AMBER (*)    APPearance HAZY (*)    Hgb urine dipstick MODERATE (*)    Ketones, ur 5 (*)     Protein, ur 30 (*)    Squamous Epithelial / LPF 0-5 (*)    All other components within normal limits  RAPID URINE DRUG SCREEN, HOSP PERFORMED - Abnormal; Notable for the following:    Opiates POSITIVE (*)    All other components within normal limits  HEPATIC FUNCTION PANEL - Abnormal; Notable for the following:    Albumin 2.7 (*)    Bilirubin, Direct <0.1 (*)    All other components within normal limits  HEMOGLOBIN A1C - Abnormal; Notable for  the following:    Hgb A1c MFr Bld 6.2 (*)    All other components within normal limits  BASIC METABOLIC PANEL - Abnormal; Notable for the following:    Sodium 133 (*)    Chloride 95 (*)    Glucose, Bld 149 (*)    BUN 24 (*)    All other components within normal limits  CBC - Abnormal; Notable for the following:    WBC 38.6 (*)    RBC 3.81 (*)    Platelets 521 (*)    All other components within normal limits  BASIC METABOLIC PANEL - Abnormal; Notable for the following:    Sodium 133 (*)    Glucose, Bld 144 (*)    All other components within normal limits  CBC WITH DIFFERENTIAL/PLATELET - Abnormal; Notable for the following:    WBC 36.8 (*)    RBC 3.53 (*)    Hemoglobin 11.2 (*)    HCT 34.9 (*)    Platelets 473 (*)    Neutro Abs 33.7 (*)    Monocytes Absolute 1.7 (*)    All other components within normal limits  CULTURE, BLOOD (ROUTINE X 2)  CULTURE, BLOOD (ROUTINE X 2)  PROTIME-INR  ETHANOL  TSH  I-STAT CG4 LACTIC ACID, ED  I-STAT TROPOININ, ED    EKG  EKG Interpretation  Date/Time:  Friday February 07 2017 11:35:06 EDT Ventricular Rate:  88 PR Interval:    QRS Duration: 79 QT Interval:  371 QTC Calculation: 449 R Axis:   5 Text Interpretation:  Sinus rhythm Probable left atrial enlargement Anterior infarct, age indeterminate Lateral leads are also involved T wave inversions anteriorly new when compared to prior Confirmed by Thayer Jew [phone]) on 02/08/2017 3:02:52 PM       Radiology Dg Chest 1 View  Result Date:  02/07/2017 CLINICAL DATA:  Leukocytosis, failure to thrive EXAM: CHEST 1 VIEW COMPARISON:  CT left shoulder 03/08/2013 FINDINGS: There is mild bilateral interstitial prominence. There is no focal consolidation. There is no pleural effusion or pneumothorax. The heart and mediastinal contours are unremarkable. There is severe osteoarthritis of the left glenohumeral joint. There is a thoracic spinal stimulator noted. IMPRESSION: No active disease. Electronically Signed   By: Kathreen Devoid   On: 02/07/2017 13:54   Ct Pelvis Wo Contrast  Result Date: 02/07/2017 CLINICAL DATA:  83 y.o. female with medical history significant of remote CVA and alcohol abuse, drinks 5-6 glasses of wine per day. Patient brought to the hospital by her 2 kids because of failure to thrive. Her story started about 5 weeks ago when she fell and bruised her tailbone, since then she was unable to ambulate very well, reported spending most of the time in the bed, had less appetite and oral intake. For the past several days she started to have some cough which is minimally productive, she denied fever and palpitation. EXAM: CT PELVIS WITHOUT CONTRAST TECHNIQUE: Multidetector CT imaging of the pelvis was performed following the standard protocol without intravenous contrast. COMPARISON:  None. FINDINGS: Urinary Tract:  No ureteral dilation.  Bladder is unremarkable. Bowel: No bowel dilation to suggest obstruction. No bowel wall thickening or inflammation. Vascular/Lymphatic: Atherosclerotic calcifications noted along the iliac vessels and lower abdominal aorta. No aneurysm. No adenopathy. Reproductive: Multiple uterine fibroids, several of dense calcification. No adnexal masses. Other:  No pelvic free fluid. Musculoskeletal: There is a mildly comminuted nondisplaced fracture of the base of the coccyx. No other fractures. Both hips show symmetric arthropathic changes with  concentric joint space narrowing and marginal osteophytes from the bases of  the femoral heads. There are degenerative changes of the visible lower lumbar spine. Bones are diffusely demineralized. IMPRESSION: 1. Comminuted nondisplaced fracture of the base of the coccyx. 2. No other fractures.  No other acute abnormalities. Electronically Signed   By: Lajean Manes M.D.   On: 02/07/2017 21:34    Procedures .Epistaxis Management Date/Time: 02/09/2017 12:22 PM Performed by: Charlesetta Shanks Authorized by: Charlesetta Shanks   Consent:    Consent obtained:  Verbal   Consent given by:  Patient Procedure details:    Treatment site:  L anterior   Treatment method:  Merocel sponge   Treatment complexity:  Limited Post-procedure details:    Assessment:  Bleeding stopped   Patient tolerance of procedure:  Tolerated well, no immediate complications   (including critical care time)  Medications Ordered in ED Medications  traZODone (DESYREL) tablet 50 mg (50 mg Oral Given 02/08/17 2130)  oxyCODONE (Oxy IR/ROXICODONE) immediate release tablet 5 mg (5 mg Oral Given 02/08/17 0540)  FLUoxetine (PROZAC) capsule 20 mg (20 mg Oral Given 02/09/17 1055)  heparin injection 5,000 Units (5,000 Units Subcutaneous Not Given 02/09/17 0600)  0.9 %  sodium chloride infusion ( Intravenous New Bag/Given 02/07/17 1835)  acetaminophen (TYLENOL) tablet 650 mg (not administered)    Or  acetaminophen (TYLENOL) suppository 650 mg (not administered)  HYDROcodone-acetaminophen (NORCO/VICODIN) 5-325 MG per tablet 1-2 tablet (2 tablets Oral Given 02/09/17 1110)  ondansetron (ZOFRAN) tablet 4 mg (not administered)    Or  ondansetron (ZOFRAN) injection 4 mg (not administered)  cefTRIAXone (ROCEPHIN) 1 g in dextrose 5 % 50 mL IVPB (1 g Intravenous New Bag/Given 02/08/17 1705)  azithromycin (ZITHROMAX) 500 mg in dextrose 5 % 250 mL IVPB (500 mg Intravenous New Bag/Given 02/08/17 1705)  guaiFENesin (MUCINEX) 12 hr tablet 600 mg (600 mg Oral Given 02/09/17 1055)  guaiFENesin-dextromethorphan (ROBITUSSIN DM)  100-10 MG/5ML syrup 5 mL (not administered)  thiamine (VITAMIN B-1) tablet 100 mg (100 mg Oral Given 2/99/24 2683)  folic acid (FOLVITE) tablet 1 mg (1 mg Oral Given 02/09/17 1055)  multivitamin with minerals tablet 1 tablet (1 tablet Oral Given 02/09/17 1055)  polyethylene glycol (MIRALAX / GLYCOLAX) packet 17 g (17 g Oral Given 02/09/17 1110)  chlorhexidine (PERIDEX) 0.12 % solution 15 mL (15 mLs Mouth Rinse Given 02/09/17 1055)  MEDLINE mouth rinse (15 mLs Mouth Rinse Not Given 02/08/17 1818)  LORazepam (ATIVAN) tablet 1 mg (1 mg Oral Given 02/08/17 2130)    Or  LORazepam (ATIVAN) injection 1 mg ( Intravenous See Alternative 02/08/17 2130)  dextrose 5 % and 0.9 % NaCl with KCl 20 mEq/L infusion ( Intravenous New Bag/Given 02/09/17 0030)  ipratropium-albuterol (DUONEB) 0.5-2.5 (3) MG/3ML nebulizer solution 3 mL (3 mLs Nebulization Given 02/09/17 0832)  albuterol (PROVENTIL) (2.5 MG/3ML) 0.083% nebulizer solution 2.5 mg (2.5 mg Nebulization Given 02/09/17 0635)  phenylephrine (NEO-SYNEPHRINE) 0.5 % nasal solution 1 drop (1 drop Each Nare Given 02/07/17 1340)  LORazepam (ATIVAN) injection 1 mg (1 mg Intravenous Given 02/07/17 2210)  sodium chloride 0.9 % bolus 500 mL (0 mLs Intravenous Stopped 02/08/17 1102)     Initial Impression / Assessment and Plan / ED Course  I have reviewed the triage vital signs and the nursing notes.  Pertinent labs & imaging results that were available during my care of the patient were reviewed by me and considered in my medical decision making (see chart for details).      Final  Clinical Impressions(s) / ED Diagnoses   Final diagnoses:  Weakness  Bronchitis  Hypoxia  Epistaxis  Leukocytosis, unspecified type   Patient has had generalized weakness and decline since a fall about 5 weeks ago. Or acutely she has been having a significant amount of cough per family members. Patient does have a 20,000 leukocytosis. Will opt to treat for community acquired pneumonia. She  is also has sporadic epistaxis. This is anterior the Kiesselbach plexus. Not anticoagulated. No clot in the posterior nasopharynx. Merocel sponge placed. Patient also has alcohol consumption of suspected 1-1/2 bottles wine per day per family members. May be at risk for alcohol withdrawal. At this time, mental status is clear. Plan for admission. New Prescriptions Current Discharge Medication List       Charlesetta Shanks, MD 02/09/17 1225

## 2017-02-07 NOTE — ED Notes (Signed)
Bed: WA15 Expected date:  Expected time:  Means of arrival:  Comments: EMS  

## 2017-02-07 NOTE — ED Notes (Signed)
ED Provider at bedside. 

## 2017-02-07 NOTE — H&P (Signed)
History and Physical    [name] MAU:[id number] DOB: 1934/03/29 DOA: 02/07/2017  PCP: Javier Glazier, MD  Patient coming from: [name], the independent living part  Chief Complaint: Cough  HPI: Yolanda [name] is a 83 y.o. female with medical history significant of remote CVA and alcohol abuse, drinks 5-6 glasses of wine per day. Patient brought to the hospital by her 2 kids because of failure to thrive. Her story started about 5 weeks ago when she fell and bruised her tailbone, since then she was unable to ambulate very well, reported spending most of the time in the bed, had less appetite and oral intake. For the past several days she started to have some cough which is minimally productive, she denied fever and palpitation.  ED Course:  Vitals: WNL, was hypoxic at 89% Labs: WBC 20.3 Imaging: CXR without acute findings Interventions: Place on antibiotics referred for observation.  Review of Systems:  12 point review of systems negative except for the symptoms mentioned in the history of present illness   Past Medical History:  Diagnosis Date  . Adenomatous colon polyp    tubular  . Anxiety disorder    panic attacks, PMH of. Dr  Janna Arch  . 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 Diverticulosis   . Fibroids    Lupron shots  . High altitude sickness 2008  . Premature menopause    due to Lupron    Past Surgical History:  Procedure Laterality Date  . BREAST LUMPECTOMY  1984   radiation , R breast  . CESAREAN SECTION     X 2  . colonoscopy with polypectomy  2011   Dr  Delfin Edis  . LAMINECTOMY     X3 L-Sspine  . MASTECTOMY  1990   R breast  . MASTECTOMY  1996   L     reports that she quit smoking about 38 years ago. She has never used smokeless tobacco. She reports that she drinks about 5.4 oz of alcohol per week . She reports that she does not use drugs.  No Known Allergies  Family History  Problem  Relation Age of Onset  . Asthma Maternal Grandmother   . Coronary artery disease Mother        dysrrhythmia  . Drug abuse Brother   . Heart Problems Father   . Cancer Neg Hx     Prior to Admission medications   Medication Sig Start Date End Date Taking? Authorizing Provider  FLUoxetine (PROZAC) 20 MG capsule Take 20 mg by mouth daily.   Yes [provider]  naloxegol oxalate (MOVANTIK) 25 MG TABS tablet Take by mouth daily.   Yes [provider]  Oxycodone HCl 10 MG TABS Take 1 tablet by mouth every 6 (six) hours as needed. 11/15/15  Yes [provider]  traZODone (DESYREL) 50 MG tablet Take 50 mg by mouth at bedtime. 02/05/17  Yes [provider]    Physical Exam:  Vitals:   02/07/17 1107 02/07/17 1115 02/07/17 1338 02/07/17 1400  BP: (!) 144/71  135/61 137/72  Pulse: 86 86 90 94  Resp: 20  (!) 21 20  Temp: 99.4 F (37.4 C)     TempSrc: Oral     SpO2: (!) 87% 95% 95% 94%    Constitutional: NAD, calm, comfortable Eyes: PERRL, lids and conjunctivae normal ENMT: Mucous membranes are moist. Posterior pharynx clear of any exudate or lesions.Normal dentition.  Neck:  normal, supple, no masses, no thyromegaly Respiratory: clear to auscultation bilaterally, no wheezing, no crackles. Normal respiratory effort. No accessory muscle use.  Cardiovascular: Regular rate and rhythm, no murmurs / rubs / gallops. No extremity edema. 2+ pedal pulses. No carotid bruits.  Abdomen: no tenderness, no masses palpated. No hepatosplenomegaly. Bowel sounds positive.  Musculoskeletal: no clubbing / cyanosis. No joint deformity upper and lower extremities. Good ROM, no contractures. Normal muscle tone.  Skin: no rashes, lesions, ulcers. No induration Neurologic: CN 2-12 grossly intact. Sensation intact, DTR normal. Strength 5/5 in all 4.  Psychiatric: Normal judgment and insight. Alert and oriented x 3. Normal mood.   Labs on Admission: I have personally reviewed  following labs and imaging studies  CBC:  Recent Labs Lab 02/07/17 1221  WBC 20.3*  HGB 12.2  HCT 36.9  MCV 98.7  PLT 825*   Basic Metabolic Panel:  Recent Labs Lab 02/07/17 1221  NA 134*  K 4.1  CL 99*  CO2 27  GLUCOSE 108*  BUN 20  CREATININE 0.63  CALCIUM 9.0   GFR: CrCl cannot be calculated (Unknown ideal weight.). Liver Function Tests:  Recent Labs Lab 02/07/17 1221  AST 27  ALT 34  ALKPHOS 73  BILITOT 0.4  PROT 6.5  ALBUMIN 2.7*   No results for input(s): LIPASE, AMYLASE in the last 168 hours. No results for input(s): AMMONIA in the last 168 hours. Coagulation Profile:  Recent Labs Lab 02/07/17 1221  INR 1.01   Cardiac Enzymes: No results for input(s): CKTOTAL, CKMB, CKMBINDEX, TROPONINI in the last 168 hours. BNP (last 3 results) No results for input(s): PROBNP in the last 8760 hours. HbA1C: No results for input(s): HGBA1C in the last 72 hours. CBG: No results for input(s): GLUCAP in the last 168 hours. Lipid Profile: No results for input(s): CHOL, HDL, LDLCALC, TRIG, CHOLHDL, LDLDIRECT in the last 72 hours. Thyroid Function Tests: No results for input(s): TSH, T4TOTAL, FREET4, T3FREE, THYROIDAB in the last 72 hours. Anemia Panel: No results for input(s): VITAMINB12, FOLATE, FERRITIN, TIBC, IRON, RETICCTPCT in the last 72 hours. Urine analysis:    Component Value Date/Time   COLORURINE AMBER (A) 02/07/2017 1159   APPEARANCEUR HAZY (A) 02/07/2017 1159   LABSPEC 1.020 02/07/2017 1159   PHURINE 5.0 02/07/2017 1159   GLUCOSEU NEGATIVE 02/07/2017 1159   HGBUR MODERATE (A) 02/07/2017 1159   BILIRUBINUR NEGATIVE 02/07/2017 1159   BILIRUBINUR neg 01/23/2012 1445   KETONESUR 5 (A) 02/07/2017 1159   PROTEINUR 30 (A) 02/07/2017 1159   UROBILINOGEN 0.2 01/23/2012 1445   NITRITE NEGATIVE 02/07/2017 1159   LEUKOCYTESUR NEGATIVE 02/07/2017 1159   Sepsis Labs: !!!!!!!!!!!!!!!!!!!!!!!!!!!!!!!!!!!!!!!!!!!! Invalid input(s): PROCALCITONIN,  LACTICIDVEN No results found for this or any previous visit (from the past 240 hour(s)).   Radiological Exams on Admission: Dg Chest 1 View  Result Date: 02/07/2017 CLINICAL DATA:  Leukocytosis, failure to thrive EXAM: CHEST 1 VIEW COMPARISON:  CT left shoulder 03/08/2013 FINDINGS: There is mild bilateral interstitial prominence. There is no focal consolidation. There is no pleural effusion or pneumothorax. The heart and mediastinal contours are unremarkable. There is severe osteoarthritis of the left glenohumeral joint. There is a thoracic spinal stimulator noted. IMPRESSION: No active disease. Electronically Signed   By: Kathreen Devoid   On: 02/07/2017 13:54    EKG: Independently reviewed.   Assessment/Plan Principal Problem:   Acute bronchitis Active Problems:   Non-compliant behavior   ETOH abuse   Fall   Coccyx pain    Acute bronchitis -  Presented with cough, shortness of breath and leukocytosis. -Started on Rocephin and azithromycin. CXR without acute findings to suggest pneumonia. -Supportive management with bronchodilators, mucolytics, antitussives and oxygen as needed.  Failure to thrive -Patient has poor appetite and oral intake, appears to be very thin likely has protein calorie malnutrition. -RD to evaluate. Per her sons at bedside she has significant alcohol intake for someone her age.  Alcohol abuse -Per sons at bedside she drinks about 5-6 glasses of wine per day, she denied any history of withdrawal. -Started on CIWA protocol, if she develop all: Withdrawal symptoms.  Fall and coccygeal pain -She denied hitting her head, the fall was about 5 weeks ago. -Per her son at bedside they think her falls are secondary to alcohol abuse. -Still complaining about significant pain in her tailbone, with soreness when she sits, pelvic CT. -She is on oxycodone 10 mg every 6 hours for this, decrease dose to half.   DVT prophylaxis: SQ Heparin Code Status: Full code Family  Communication: Plan D/W patient with 2 sons at bedside  Disposition Plan: Home Consults called:  Admission status: Observation   [facility] A MD [facility] Pager [phone]  If 7PM-7AM, please contact night-coverage [email or web address] Password [email or web address]  02/07/2017, 5:22 PM

## 2017-02-07 NOTE — ED Triage Notes (Signed)
Pt presents via [facility] from [place], pt lives in an apartment by herself. Pt has had failure to thrive and generalized weakness per her family since she fell 5 weeks ago. Pt has also had significant nose bleeds for the past few days. EMS states there was quite a bit of blood noted in the bathroom. Pt has not complaints at this time. Pt O2 sats 89% on RA but no SOB noted.

## 2017-02-07 NOTE — ED Notes (Signed)
Pt bleeding from L nare and spitting up blood. Pt instructed to hold pressure to nose. EDP made aware.

## 2017-02-07 NOTE — ED Notes (Addendum)
Pt son at bedside, advises pt fell 5 weeks ago, bruised her coccyx and has been basically bedridden since then. She has been eating very little and not wanting to ambulate. Pt lives alone and has been resistant to help. Pt does take several oxycodone per day, son has been regulating this medication this week but for the past few weeks they report she has most likely been taking more than prescribed. Pt is also drinking wine all day per son.

## 2017-02-07 NOTE — ED Notes (Signed)
IV attempted x2 without success.

## 2017-02-08 DIAGNOSIS — F1010 Alcohol abuse, uncomplicated: Secondary | ICD-10-CM | POA: Diagnosis not present

## 2017-02-08 DIAGNOSIS — J90 Pleural effusion, not elsewhere classified: Secondary | ICD-10-CM | POA: Diagnosis not present

## 2017-02-08 DIAGNOSIS — F418 Other specified anxiety disorders: Secondary | ICD-10-CM | POA: Diagnosis present

## 2017-02-08 DIAGNOSIS — J869 Pyothorax without fistula: Secondary | ICD-10-CM | POA: Diagnosis not present

## 2017-02-08 DIAGNOSIS — Z9119 Patient's noncompliance with other medical treatment and regimen: Secondary | ICD-10-CM | POA: Diagnosis not present

## 2017-02-08 DIAGNOSIS — I509 Heart failure, unspecified: Secondary | ICD-10-CM | POA: Diagnosis present

## 2017-02-08 DIAGNOSIS — D638 Anemia in other chronic diseases classified elsewhere: Secondary | ICD-10-CM | POA: Diagnosis present

## 2017-02-08 DIAGNOSIS — D72829 Elevated white blood cell count, unspecified: Secondary | ICD-10-CM

## 2017-02-08 DIAGNOSIS — E43 Unspecified severe protein-calorie malnutrition: Secondary | ICD-10-CM

## 2017-02-08 DIAGNOSIS — M533 Sacrococcygeal disorders, not elsewhere classified: Secondary | ICD-10-CM | POA: Diagnosis present

## 2017-02-08 DIAGNOSIS — Z7401 Bed confinement status: Secondary | ICD-10-CM | POA: Diagnosis not present

## 2017-02-08 DIAGNOSIS — F1020 Alcohol dependence, uncomplicated: Secondary | ICD-10-CM | POA: Diagnosis present

## 2017-02-08 DIAGNOSIS — R040 Epistaxis: Secondary | ICD-10-CM | POA: Diagnosis present

## 2017-02-08 DIAGNOSIS — J9601 Acute respiratory failure with hypoxia: Secondary | ICD-10-CM | POA: Diagnosis not present

## 2017-02-08 DIAGNOSIS — Z8673 Personal history of transient ischemic attack (TIA), and cerebral infarction without residual deficits: Secondary | ICD-10-CM | POA: Diagnosis not present

## 2017-02-08 DIAGNOSIS — J9621 Acute and chronic respiratory failure with hypoxia: Secondary | ICD-10-CM | POA: Diagnosis present

## 2017-02-08 DIAGNOSIS — Z79899 Other long term (current) drug therapy: Secondary | ICD-10-CM | POA: Diagnosis not present

## 2017-02-08 DIAGNOSIS — I110 Hypertensive heart disease with heart failure: Secondary | ICD-10-CM | POA: Diagnosis present

## 2017-02-08 DIAGNOSIS — S322XXA Fracture of coccyx, initial encounter for closed fracture: Secondary | ICD-10-CM | POA: Diagnosis present

## 2017-02-08 DIAGNOSIS — J209 Acute bronchitis, unspecified: Secondary | ICD-10-CM | POA: Diagnosis present

## 2017-02-08 DIAGNOSIS — R0902 Hypoxemia: Secondary | ICD-10-CM | POA: Diagnosis not present

## 2017-02-08 DIAGNOSIS — W1830XA Fall on same level, unspecified, initial encounter: Secondary | ICD-10-CM | POA: Diagnosis present

## 2017-02-08 LAB — BASIC METABOLIC PANEL
ANION GAP: 10 (ref 5–15)
BUN: 24 mg/dL — ABNORMAL HIGH (ref 6–20)
CALCIUM: 9 mg/dL (ref 8.9–10.3)
CO2: 28 mmol/L (ref 22–32)
CREATININE: 0.7 mg/dL (ref 0.44–1.00)
Chloride: 95 mmol/L — ABNORMAL LOW (ref 101–111)
GFR calc Af Amer: >60 mL/min (ref >60)
GFR calc non Af Amer: >60 mL/min (ref >60)
GLUCOSE: 149 mg/dL — AB (ref 65–99)
Potassium: 4.4 mmol/L (ref 3.5–5.1)
Sodium: 133 mmol/L — ABNORMAL LOW (ref 135–145)

## 2017-02-08 LAB — CBC
HCT: 36.9 % (ref 36.0–46.0)
HEMOGLOBIN: 12.5 g/dL (ref 12.0–15.0)
MCH: 32.8 pg (ref 26.0–34.0)
MCHC: 33.9 g/dL (ref 30.0–36.0)
MCV: 96.9 fL (ref 78.0–100.0)
PLATELETS: 521 10*3/uL — AB (ref 150–400)
RBC: 3.81 MIL/uL — AB (ref 3.87–5.11)
RDW: 13.2 % (ref 11.5–15.5)
WBC: 38.6 10*3/uL — ABNORMAL HIGH (ref 4.0–10.5)

## 2017-02-08 LAB — HEMOGLOBIN A1C
HEMOGLOBIN A1C: 6.2 % — AB (ref 4.8–5.6)
Mean Plasma Glucose: 131 mg/dL

## 2017-02-08 MED ORDER — POTASSIUM CHLORIDE IN NACL 20-0.9 MEQ/L-% IV SOLN: INTRAVENOUS | Status: DC

## 2017-02-08 MED ORDER — IPRATROPIUM-ALBUTEROL 0.5-2.5 (3) MG/3ML IN SOLN
3.0000 mL | Freq: Three times a day (TID) | RESPIRATORY_TRACT | Status: DC
  Administered 2017-02-08 – 2017-02-11 (×11): 3 mL via RESPIRATORY_TRACT
  Filled 2017-02-08 (×11): qty 3

## 2017-02-08 MED ORDER — ALBUTEROL SULFATE (2.5 MG/3ML) 0.083% IN NEBU
2.5000 mg | INHALATION_SOLUTION | RESPIRATORY_TRACT | Status: DC | PRN
  Administered 2017-02-09: 2.5 mg via RESPIRATORY_TRACT
  Filled 2017-02-08: qty 3

## 2017-02-08 MED ORDER — KCL IN DEXTROSE-NACL 20-5-0.9 MEQ/L-%-% IV SOLN
INTRAVENOUS | Status: DC
  Administered 2017-02-08 – 2017-02-10 (×3): via INTRAVENOUS
  Filled 2017-02-08 (×4): qty 1000

## 2017-02-08 MED ORDER — SODIUM CHLORIDE 0.9 % IV BOLUS (SEPSIS)
500.0000 mL | Freq: Once | INTRAVENOUS | Status: AC
  Administered 2017-02-08: 500 mL via INTRAVENOUS
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
## 2017-02-08 NOTE — Evaluation (Signed)
Physical Therapy Evaluation Patient Details Name: Yolanda [name] MRN: [id number] DOB: 1934/06/17 Today's Date: 02/08/2017   History of Present Illness  Yolanda [name] is a 83 y.o. female with medical history significant of remote CVA and alcohol abuse, drinks 5-6 glasses of wine per day. Patient brought to the hospital by her 2 kids because of failure to thrive. Reports a fall 5 weeks ago with bruised her tailbone, since then she was unable to ambulate very well, reported spending most of the time in the bed, had less appetite and oral intake.  Clinical Impression  Patient presents with decreased independence and safety with mobility due to pain, weakness, poor balance, decreased cognition and safety awareness and will benefit from skilled PT in the acute setting and follow up SNF level rehab.  Not safe to return home at this time with high fall risk and inability to care for herself.     Follow Up Recommendations SNF    Equipment Recommendations  None recommended by PT    Recommendations for Other Services       Precautions / Restrictions Precautions Precautions: Fall      Mobility  Bed Mobility Overal bed mobility: Needs Assistance Bed Mobility: Supine to Sit     Supine to sit: HOB elevated;Mod assist     General bed mobility comments: cues for technique, assist to scoot hips and lift trunk  Transfers Overall transfer level: Needs assistance Equipment used: Rolling walker (2 wheeled) Transfers: Sit to/from Stand Sit to Stand: Mod assist         General transfer comment: cues for hand placement, lifting assist  Ambulation/Gait Ambulation/Gait assistance: Mod assist Ambulation Distance (Feet): 60 Feet Assistive device: Rolling walker (2 wheeled) Gait Pattern/deviations: Step-through pattern;Shuffle;Decreased stride length;Drifts right/left     General Gait Details: mod A for maneuvering walker, used to rollator and unable to turn walker safely without  help  Stairs            Wheelchair Mobility    Modified Rankin (Stroke Patients Only)       Balance Overall balance assessment: Needs assistance Sitting-balance support: Feet supported Sitting balance-Leahy Scale: Fair     Standing balance support: Bilateral upper extremity supported Standing balance-Leahy Scale: Poor Standing balance comment: UE support for balance                             Pertinent Vitals/Pain Pain Assessment: Faces Faces Pain Scale: Hurts little more Pain Location: L flank Pain Descriptors / Indicators: Discomfort;Grimacing;Guarding Pain Intervention(s): Monitored during session;Repositioned    Home Living Family/patient expects to be discharged to:: Skilled nursing facility                 Additional Comments: from [place]    Prior Function Level of Independence: Independent with assistive device(s)         Comments: was using rollator walker prior to admission, but relates staying in bed and not eating for several weeks     Hand Dominance        Extremity/Trunk Assessment   Upper Extremity Assessment Upper Extremity Assessment: Generalized weakness    Lower Extremity Assessment Lower Extremity Assessment: Generalized weakness    Cervical / Trunk Assessment Cervical / Trunk Assessment: Kyphotic  Communication   Communication: HOH  Cognition Arousal/Alertness: Awake/alert Behavior During Therapy: WFL for tasks assessed/performed Overall Cognitive Status: Impaired/Different from baseline Area of Impairment: Memory  Memory: Decreased short-term memory         General Comments: unable to recall her home set up accurately      General Comments      Exercises     Assessment/Plan    PT Assessment Patient needs continued PT services  PT Problem List Decreased activity tolerance;Decreased balance;Pain;Decreased knowledge of use of DME;Decreased  cognition;Decreased strength;Decreased mobility;Decreased safety awareness       PT Treatment Interventions DME instruction;Gait training;Therapeutic exercise;Patient/family education;Therapeutic activities;Balance training;Functional mobility training    PT Goals (Current goals can be found in the Care Plan section)  Acute Rehab PT Goals Patient Stated Goal: Agrees to rehab but wants to bring her cat PT Goal Formulation: With patient Time For Goal Achievement: 02/15/17 Potential to Achieve Goals: Good    Frequency Min 2X/week   Barriers to discharge Decreased caregiver support      Co-evaluation               AM-PAC PT "6 Clicks" Daily Activity  Outcome Measure Difficulty turning over in bed (including adjusting bedclothes, sheets and blankets)?: A Lot Difficulty moving from lying on back to sitting on the side of the bed? : Total Difficulty sitting down on and standing up from a chair with arms (e.g., wheelchair, bedside commode, etc,.)?: Total Help needed moving to and from a bed to chair (including a wheelchair)?: A Lot Help needed walking in hospital room?: A Lot Help needed climbing 3-5 steps with a railing? : A Lot 6 Click Score: 10    End of Session Equipment Utilized During Treatment: Gait belt;Oxygen Activity Tolerance: Patient limited by fatigue Patient left: with call bell/phone within reach;in chair;with chair alarm set   PT Visit Diagnosis: Muscle weakness (generalized) (M62.81);History of falling (Z91.81);Difficulty in walking, not elsewhere classified (R26.2)    Time: 2883-3744 PT Time Calculation (min) (ACUTE ONLY): 27 min   Charges:   PT Evaluation $PT Eval Moderate Complexity: 1 Procedure PT Treatments $Gait Training: 8-22 mins   PT G CodesMagda Kiel, [place] [phone] 02/08/2017   Reginia Naas 02/08/2017, 4:18 PM

## 2017-02-09 ENCOUNTER — Inpatient Hospital Stay (HOSPITAL_COMMUNITY): Payer: 59

## 2017-02-09 DIAGNOSIS — R0902 Hypoxemia: Secondary | ICD-10-CM

## 2017-02-09 LAB — CBC WITH DIFFERENTIAL/PLATELET
Basophils Absolute: 0 10*3/uL (ref 0.0–0.1)
Basophils Relative: 0 %
Eosinophils Absolute: 0 10*3/uL (ref 0.0–0.7)
Eosinophils Relative: 0 %
HCT: 34.9 % — ABNORMAL LOW (ref 36.0–46.0)
Hemoglobin: 11.2 g/dL — ABNORMAL LOW (ref 12.0–15.0)
Lymphocytes Relative: 4 %
Lymphs Abs: 1.3 10*3/uL (ref 0.7–4.0)
MCH: 31.7 pg (ref 26.0–34.0)
MCHC: 32.1 g/dL (ref 30.0–36.0)
MCV: 98.9 fL (ref 78.0–100.0)
Monocytes Absolute: 1.7 10*3/uL — ABNORMAL HIGH (ref 0.1–1.0)
Monocytes Relative: 5 %
Neutro Abs: 33.7 10*3/uL — ABNORMAL HIGH (ref 1.7–7.7)
Neutrophils Relative %: 92 %
Platelets: 473 10*3/uL — ABNORMAL HIGH (ref 150–400)
RBC: 3.53 MIL/uL — ABNORMAL LOW (ref 3.87–5.11)
RDW: 13.4 % (ref 11.5–15.5)
WBC: 36.8 10*3/uL — ABNORMAL HIGH (ref 4.0–10.5)

## 2017-02-09 LAB — BASIC METABOLIC PANEL
ANION GAP: 7 (ref 5–15)
BUN: 16 mg/dL (ref 6–20)
CO2: 25 mmol/L (ref 22–32)
Calcium: 8.9 mg/dL (ref 8.9–10.3)
Chloride: 101 mmol/L (ref 101–111)
Creatinine, Ser: 0.54 mg/dL (ref 0.44–1.00)
GFR calc non Af Amer: >60 mL/min (ref >60)
GLUCOSE: 144 mg/dL — AB (ref 65–99)
Potassium: 4.4 mmol/L (ref 3.5–5.1)
Sodium: 133 mmol/L — ABNORMAL LOW (ref 135–145)

## 2017-02-09 MED ORDER — NITROGLYCERIN 0.4 MG SL SUBL
SUBLINGUAL_TABLET | SUBLINGUAL | Status: AC
  Administered 2017-02-09: 0.4 mg
  Filled 2017-02-09: qty 1

## 2017-02-09 MED ORDER — HYDROGEN PEROXIDE 3 % EX SOLN
CUTANEOUS | Status: AC
  Administered 2017-02-09: 14:00:00
  Filled 2017-02-09: qty 473

## 2017-02-09 NOTE — Clinical Social Work Note (Signed)
Clinical Social Work Assessment  Patient Details  Name: Yolanda [name] MRN: [id number] Date of Birth: Dec 24, 1933  Date of referral:  02/09/17               Reason for consult:  Facility Placement                Permission sought to share information with:  Facility Sport and exercise psychologist, Family Supports Permission granted to share information::  Yes, Verbal Permission Granted  Name::     Ewell Poe,   Agency::  [facility]  Relationship::  Son   Contact Information:  [phone]   Housing/Transportation Living arrangements for the past 2 months:  [place] of Information:  Adult Children Patient Interpreter Needed:  None Criminal Activity/Legal Involvement Pertinent to Current Situation/Hospitalization:  No - Comment as needed Significant Relationships:  Adult Children Lives with:  Facility Resident Do you feel safe going back to the place where you live?  Yes Need for family participation in patient care:  Yes (Comment)  Care giving concerns: Patient from [place]. Family at bedside. Patient and family agreeable to SNF and prefer [facility].    Social Worker assessment / plan: CSW met with patient and family at bedside. Patient's sons indicated preference for [facility]. CSW spoke to [facility] who indicated they will take patient when ready for discharge. CSW will send initial referral to [facility].   Employment status:  Retired Forensic scientist:  Other (Comment Required) Psychologist, counselling) PT Recommendations:  [place] / Referral to community resources:  [place]  Patient/Family's Response to care: Patient was lethargic and minimally engaged with CSW. Patient's sons with good understanding of patient's condition and dischrge process. Family agreeable to SNF.  Patient/Family's Understanding of and Emotional Response to Diagnosis, Current Treatment, and Prognosis: Patient lethargic. Family  appropriate and involved in care. Family agreeable to SNF and hopeful for patient's mobility recovery in rehab so patient can return to [place].  Emotional Assessment Appearance:  Appears stated age Attitude/Demeanor/Rapport:  Lethargic Affect (typically observed):  Appropriate Orientation:  Oriented to Self, Oriented to Place Alcohol / Substance use:  Alcohol Use Psych involvement (Current and /or in the community):  No (Comment)  Discharge Needs  Concerns to be addressed:  Discharge Planning Concerns Readmission within the last 30 days:  No Current discharge risk:  Dependent with Mobility Barriers to Discharge:  Continued Medical Work up   Estanislado Emms, LCSW 02/09/2017, 11:43 AM

## 2017-02-09 NOTE — Progress Notes (Addendum)
Initial Nutrition Assessment  DOCUMENTATION CODES:   Non-severe (moderate) malnutrition in context of chronic illness  INTERVENTION:   Encourage PO intake RD to continue to monitor  NUTRITION DIAGNOSIS:   Malnutrition (moderate) related to chronic illness (ETOH abuse) as evidenced by mild depletion of body fat, moderate depletions of muscle mass  GOAL:   Patient will meet greater than or equal to 90% of their needs  MONITOR:   PO intake, Labs, Weight trends, I & O's  REASON FOR ASSESSMENT:   Consult Assessment of nutrition requirement/status  ASSESSMENT:   83 year old female with history of prior stroke, alcohol abuse and drinks about 6 glasses of wine everyday, fall sustaining coccygeal fracture about 5 weeks ago brought in by patient's son further evaluation of failure to thrive, cough. In the ER patient was found to have hypoxia in room air with leukocytosis. Chest x-ray with no acute finding. Treated with antibiotics and admitted for further evaluation.  Patient in room with no family at bedside. Pt states that she is eating with no issues. Pt with snacks at bedside such as watermelon, croissants, etc. Pt ate scrambled eggs and bacon this morning for breakfast. Pt states her children bring her food from home. PO intake: 15-50%. Pt denies issues with eating, states she was eating 3 meals a day at home. Offered to order protein supplements for patient and she declines at this time. Encouraged pt to request supplements if she does not eat as much as she needs to.  Per chart review, pt has lost weight since 2017 but insignificant for time frame. Nutrition-Focused physical exam completed. Findings are mild fat depletion, moderate muscle depletion, and no edema.   Medications: folic acid tablet daily, Multivitamin with minerals daily, Miralax packet daily, Thiamine tablet daily, D5 and .9% NaCl w/ KCl infusion at 100 ml/hr-provides 408 kcal Labs reviewed: Low Na  Diet Order:   Diet regular Room service appropriate? Yes; Fluid consistency: Thin  Skin:  Reviewed, no issues  Last BM:  PTA  Height:   Ht Readings from Last 1 Encounters:  02/07/17 5\' 2"  (1.575 m)    Weight:   Wt Readings from Last 1 Encounters:  02/07/17 106 lb 0.7 oz (48.1 kg)    Ideal Body Weight:  50 kg  BMI:  Body mass index is 19.4 kg/m.  Estimated Nutritional Needs:   Kcal:  1450-1650  Protein:  65-75g  Fluid:  1.6L/day  EDUCATION NEEDS:   No education needs identified at this time  Clayton Bibles, MS, RD, LDN Pager: [phone] After Hours Pager: [phone]

## 2017-02-09 NOTE — Evaluation (Signed)
Occupational Therapy Evaluation Patient Details Name: Yolanda [name] MRN: [id number] DOB: Mar 26, 1934 Today's Date: 02/09/2017    History of Present Illness Yolanda [name] is a 83 y.o. female with medical history significant of remote CVA and alcohol abuse, drinks 5-6 glasses of wine per day. Patient brought to the hospital by her 2 kids because of failure to thrive. Reports a fall 5 weeks ago with bruised her tailbone, since then she was unable to ambulate very well, reported spending most of the time in the bed, had less appetite and oral intake.   Clinical Impression   Pt limited by pain this visit 5/10 in L flank and only tolerated about 2 minutes of sitting at EOB today. O2 sats on 3L was 87-88% at EOB but increased to low 90s once returned to bed. Pt will benefit from continued OT to increase independence with self care tasks for next venue.    Follow Up Recommendations  SNF;Supervision/Assistance - 24 hour    Equipment Recommendations   (defer next venue)    Recommendations for Other Services       Precautions / Restrictions Precautions Precautions: Fall Precaution Comments: monitor O2      Mobility Bed Mobility Overal bed mobility: Needs Assistance Bed Mobility: Supine to Sit;Sit to Supine     Supine to sit: HOB elevated;Mod assist Sit to supine: Max assist   General bed mobility comments: increased time, assist to bring trunk to upright and scoot hips to EOB. Max assist for return to supine due to pain to bring LEs onto bed and support trunk.  Transfers                 General transfer comment: not able to this visit due to pain.     Balance                                           ADL either performed or assessed with clinical judgement   ADL Overall ADL's : Needs assistance/impaired Eating/Feeding: Minimal assistance;Bed level   Grooming: Wash/dry face;Set up;Bed level   Upper Body Bathing: Total assistance;Sitting    Lower Body Bathing: Total assistance;Bed level   Upper Body Dressing : Total assistance;Sitting   Lower Body Dressing: Total assistance;Bed level                 General ADL Comments: Pt attempted EOB sitting to perform grooming task but once EOB, pt starting to complain of more L flank pain and not able to tolerate sitting. Pt's O2 sats on 3L at EOB down to 87-88% but up to 93% once back to bed. Informed nursing of pain. Assisted pt back to supine and pt performed washing her face  from supine.      Vision Patient Visual Report: No change from baseline       Perception     Praxis      Pertinent Vitals/Pain Pain Assessment: 0-10 Pain Score: 5  Pain Location: L flank Pain Descriptors / Indicators: Grimacing;Guarding;Aching Pain Intervention(s): Monitored during session;Limited activity within patient's tolerance     Hand Dominance     Extremity/Trunk Assessment Upper Extremity Assessment Upper Extremity Assessment: LUE deficits/detail LUE Deficits / Details: residual weakness from a stroke. approximately 30 degrees shoulder flexion. weak grip.           Communication Communication Communication: HOH   Cognition Arousal/Alertness: Awake/alert Behavior  During Therapy: WFL for tasks assessed/performed Overall Cognitive Status: No family/caregiver present to determine baseline cognitive functioning                                     General Comments       Exercises     Shoulder Instructions      Home Living Family/patient expects to be discharged to:: Skilled nursing facility                                 Additional Comments: from [place]      Prior Functioning/Environment Level of Independence: Independent with assistive device(s)        Comments: was using rollator walker prior to admission, but relates staying in bed and not eating for several weeks        OT Problem List: Decreased strength;Decreased  knowledge of use of DME or AE;Pain      OT Treatment/Interventions: Self-care/ADL training;DME and/or AE instruction;Patient/family education;Therapeutic activities    OT Goals(Current goals can be found in the care plan section) Acute Rehab OT Goals Patient Stated Goal: agrees to work to get stronger. OT Goal Formulation: With patient Time For Goal Achievement: 02/23/17 Potential to Achieve Goals: Good  OT Frequency: Min 2X/week   Barriers to D/C:            Co-evaluation              AM-PAC PT "6 Clicks" Daily Activity     Outcome Measure Help from another person eating meals?: A Lot Help from another person taking care of personal grooming?: A Lot Help from another person toileting, which includes using toliet, bedpan, or urinal?: A Lot Help from another person bathing (including washing, rinsing, drying)?: A Lot Help from another person to put on and taking off regular upper body clothing?: A Lot Help from another person to put on and taking off regular lower body clothing?: A Lot 6 Click Score: 12   End of Session Equipment Utilized During Treatment: Oxygen Nurse Communication: Mobility status  Activity Tolerance: No increased pain;Patient limited by fatigue Patient left: in bed;with call bell/phone within reach;with bed alarm set  OT Visit Diagnosis: Unsteadiness on feet (R26.81);Muscle weakness (generalized) (M62.81)                Time: 3474-2595 OT Time Calculation (min): 26 min Charges:  OT General Charges $OT Visit: 1 Procedure OT Evaluation $OT Eval Moderate Complexity: 1 Procedure OT Treatments $Therapeutic Activity: 8-22 mins G-Codes:       Jae Dire Breanda Greenlaw 02/09/2017, 11:58 AM

## 2017-02-09 NOTE — NC FL2 (Signed)
[place] MEDICAID FL2 LEVEL OF CARE SCREENING TOOL     IDENTIFICATION  Patient Name: Yolanda [name] Birthdate: 10-Feb-1934 Sex: female Admission Date (Current Location): 02/07/2017  [facility] and [place] Number:  Herbalist and Address:  [facility],  [address] [place], [place]      Provider Number: [id number]  Attending Physician Name and Address:  Rosita Fire, MD  Relative Name and Phone Number:  Delphia Grates, [phone]     Current Level of Care: Hospital Recommended Level of Care: [place] Prior Approval Number:    Date Approved/Denied:   PASRR Number: [id number]  Discharge Plan: SNF    Current Diagnoses: Patient Active Problem List   Diagnosis Date Noted  . Leukocytosis   . Severe protein-calorie malnutrition ([place])   . Acute bronchitis 02/07/2017  . ETOH abuse 02/07/2017  . Fall 02/07/2017  . Coccyx pain 02/07/2017  . Abnormal EKG 11/24/2015  . Pre-operative cardiovascular examination 11/24/2015  . Non-compliant behavior 06/23/2015  . ARTHRALGIA 05/14/2010  . SIGMOID POLYP 10/10/2009  . CARPAL TUNNEL SYNDROME 05/24/2009  . FASTING HYPERGLYCEMIA 05/24/2009  . ASTHMA 05/19/2008  . GERD 05/19/2008  . ELEVATED BLOOD PRESSURE WITHOUT DIAGNOSIS OF HYPERTENSION 05/19/2008  . TRIGGER FINGER, RIGHT MIDDLE 10/05/2007  . DUPUYTREN'S CONTRACTURE, RIGHT 10/05/2007    Orientation RESPIRATION BLADDER Height & Weight     Self, Place  O2 (venturi mask, 4L/min, 30 FiO2) Incontinent Weight: 106 lb 0.7 oz (48.1 kg) Height:  5\' 2"  (157.5 cm)  BEHAVIORAL SYMPTOMS/MOOD NEUROLOGICAL BOWEL NUTRITION STATUS        Diet (regular, please see DC summary)  AMBULATORY STATUS COMMUNICATION OF NEEDS Skin   Extensive Assist Verbally Normal                       Personal Care Assistance Level of Assistance  Bathing, Feeding, Dressing Bathing Assistance: Maximum assistance Feeding assistance: Independent Dressing  Assistance: Maximum assistance     Functional Limitations Info  Sight, Hearing, Speech Sight Info: Adequate Hearing Info: Adequate Speech Info: Adequate    SPECIAL CARE FACTORS FREQUENCY  PT (By licensed PT), OT (By licensed OT)     PT Frequency: 5x/week OT Frequency: 5x/week            Contractures Contractures Info: Not present    Additional Factors Info  Code Status, Allergies, Psychotropic Code Status Info: Full Allergies Info: NKA Psychotropic Info: trazadone         Current Medications (02/09/2017):  This is the current hospital active medication list Current Facility-Administered Medications  Medication Dose Route Frequency Provider Last Rate Last Dose  . 0.9 %  sodium chloride infusion   Intravenous Continuous Verlee Monte, MD 10 mL/hr at 02/07/17 1835    . acetaminophen (TYLENOL) tablet 650 mg  650 mg Oral Q6H PRN Verlee Monte, MD       Or  . acetaminophen (TYLENOL) suppository 650 mg  650 mg Rectal Q6H PRN Verlee Monte, MD      . albuterol (PROVENTIL) (2.5 MG/3ML) 0.083% nebulizer solution 2.5 mg  2.5 mg Nebulization Q3H PRN Rosita Fire, MD   2.5 mg at 02/09/17 3614  . azithromycin (ZITHROMAX) 500 mg in dextrose 5 % 250 mL IVPB  500 mg Intravenous Q24H Verlee Monte, MD 250 mL/hr at 02/08/17 1705 500 mg at 02/08/17 1705  . cefTRIAXone (ROCEPHIN) 1 g in dextrose 5 % 50 mL IVPB  1 g Intravenous Q24H Verlee Monte, MD  100 mL/hr at 02/08/17 1705 1 g at 02/08/17 1705  . chlorhexidine (PERIDEX) 0.12 % solution 15 mL  15 mL Mouth Rinse BID Verlee Monte, MD   15 mL at 02/09/17 1055  . dextrose 5 % and 0.9 % NaCl with KCl 20 mEq/L infusion   Intravenous Continuous Rosita Fire, MD 100 mL/hr at 02/09/17 0030    . FLUoxetine (PROZAC) capsule 20 mg  20 mg Oral Daily Verlee Monte, MD   20 mg at 02/09/17 1055  . folic acid (FOLVITE) tablet 1 mg  1 mg Oral Daily Verlee Monte, MD   1 mg at 02/09/17 1055  . guaiFENesin (MUCINEX) 12 hr tablet 600 mg  600  mg Oral BID Verlee Monte, MD   600 mg at 02/09/17 1055  . guaiFENesin-dextromethorphan (ROBITUSSIN DM) 100-10 MG/5ML syrup 5 mL  5 mL Oral Q4H PRN Verlee Monte, MD      . heparin injection 5,000 Units  5,000 Units Subcutaneous Q8H Verlee Monte, MD   5,000 Units at 02/08/17 1400  . HYDROcodone-acetaminophen (NORCO/VICODIN) 5-325 MG per tablet 1-2 tablet  1-2 tablet Oral Q4H PRN Verlee Monte, MD   2 tablet at 02/09/17 1110  . ipratropium-albuterol (DUONEB) 0.5-2.5 (3) MG/3ML nebulizer solution 3 mL  3 mL Nebulization TID Rosita Fire, MD   3 mL at 02/09/17 3748  . LORazepam (ATIVAN) injection 1 mg  1 mg Intravenous Q4H PRN Reubin Milan, MD   1 mg at 02/08/17 2707   Or  . LORazepam (ATIVAN) tablet 1 mg  1 mg Oral Q4H PRN Reubin Milan, MD   1 mg at 02/08/17 2130  . MEDLINE mouth rinse  15 mL Mouth Rinse q12n4p Verlee Monte, MD      . multivitamin with minerals tablet 1 tablet  1 tablet Oral Daily Verlee Monte, MD   1 tablet at 02/09/17 1055  . ondansetron (ZOFRAN) tablet 4 mg  4 mg Oral Q6H PRN Verlee Monte, MD       Or  . ondansetron (ZOFRAN) injection 4 mg  4 mg Intravenous Q6H PRN Verlee Monte, MD      . oxyCODONE (Oxy IR/ROXICODONE) immediate release tablet 5 mg  5 mg Oral Q6H PRN Verlee Monte, MD   5 mg at 02/08/17 0540  . polyethylene glycol (MIRALAX / GLYCOLAX) packet 17 g  17 g Oral Daily Verlee Monte, MD   17 g at 02/09/17 1110  . thiamine (VITAMIN B-1) tablet 100 mg  100 mg Oral Daily Verlee Monte, MD   100 mg at 02/09/17 1110  . traZODone (DESYREL) tablet 50 mg  50 mg Oral QHS Verlee Monte, MD   50 mg at 02/08/17 2130     Discharge Medications: Please see discharge summary for a list of discharge medications.  Relevant Imaging Results:  Relevant Lab Results:   Additional Information SSN: [id number]  Estanislado Emms, LCSW
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
## 2017-02-10 ENCOUNTER — Inpatient Hospital Stay (HOSPITAL_COMMUNITY): Payer: 59

## 2017-02-10 DIAGNOSIS — J9601 Acute respiratory failure with hypoxia: Secondary | ICD-10-CM

## 2017-02-10 DIAGNOSIS — J90 Pleural effusion, not elsewhere classified: Secondary | ICD-10-CM

## 2017-02-10 DIAGNOSIS — J9621 Acute and chronic respiratory failure with hypoxia: Secondary | ICD-10-CM

## 2017-02-10 DIAGNOSIS — J209 Acute bronchitis, unspecified: Principal | ICD-10-CM

## 2017-02-10 LAB — CBC WITH DIFFERENTIAL/PLATELET
Basophils Absolute: 0 10*3/uL (ref 0.0–0.1)
Basophils Relative: 0 %
Eosinophils Absolute: 0 10*3/uL (ref 0.0–0.7)
Eosinophils Relative: 0 %
HCT: 31.2 % — ABNORMAL LOW (ref 36.0–46.0)
Hemoglobin: 9.9 g/dL — ABNORMAL LOW (ref 12.0–15.0)
Lymphocytes Relative: 3 %
Lymphs Abs: 1 10*3/uL (ref 0.7–4.0)
MCH: 31.5 pg (ref 26.0–34.0)
MCHC: 31.7 g/dL (ref 30.0–36.0)
MCV: 99.4 fL (ref 78.0–100.0)
Monocytes Absolute: 1.7 10*3/uL — ABNORMAL HIGH (ref 0.1–1.0)
Monocytes Relative: 5 %
Neutro Abs: 32.1 10*3/uL — ABNORMAL HIGH (ref 1.7–7.7)
Neutrophils Relative %: 92 %
Platelets: 471 10*3/uL — ABNORMAL HIGH (ref 150–400)
RBC: 3.14 MIL/uL — ABNORMAL LOW (ref 3.87–5.11)
RDW: 13.6 % (ref 11.5–15.5)
WBC: 34.8 10*3/uL — ABNORMAL HIGH (ref 4.0–10.5)

## 2017-02-10 LAB — GLUCOSE, PLEURAL OR PERITONEAL FLUID: Glucose, Fluid: <20 mg/dL

## 2017-02-10 LAB — BODY FLUID CELL COUNT WITH DIFFERENTIAL
LYMPHS FL: 8 %
Monocyte-Macrophage-Serous Fluid: 4 % — ABNORMAL LOW (ref 50–90)
NEUTROPHIL FLUID: 88 % — AB (ref 0–25)
WBC FLUID: 11561 uL — AB (ref 0–1000)

## 2017-02-10 LAB — TROPONIN I: Troponin I: <0.03 ng/mL (ref <0.03)

## 2017-02-10 NOTE — Procedures (Signed)
Ultrasound-guided diagnostic and therapeutic left thoracentesis performed yielding 390 cc of hazy, yellow fluid. No immediate complications. Follow-up chest x-ray pending. Due to extensive multiloculations of the pleural space only the above amount of fluid could be aspirated today. Rec TCTS consult.

## 2017-02-10 NOTE — Consult Note (Signed)
PULMONARY / CRITICAL CARE MEDICINE   Name: Yolanda [name] MRN: [id number] DOB: 1934-02-20    ADMISSION DATE:  02/07/2017 CONSULTATION DATE:  02/10/2017  REFERRING MD:  Carolin Sicks  CHIEF COMPLAINT:  Dyspnea  HISTORY OF PRESENT ILLNESS:   83 year old female with a prior history of alcohol abuse was brought in by the patient's son for increasing dyspnea cough and failure to thrive. The patient's son notes that approximately 5 weeks prior to admission she fell and broke her coccyx. Since then she's been bedbound and has been eating poorly. She continues to drink wine during this time. Her son notes that she's had a progressive dry cough with occasional clear mucus production. She's also had increasing shortness of breath. Because of all these symptoms slowly and steadily worsening without improvement he brought her to the emergency department last week. Over the last several days she's been noted to have increasing shortness of breath. On chest imaging yesterday she was noted to have complete white out of the left lung. A CT scan of the chest was performed showing a large pleural effusion and airway mucus. Pulmonary and critical care medicine was consulted. She denies chest pain right now. She's not had any hemoptysis. Of note, she did have a significant nosebleed several days ago.  PAST MEDICAL HISTORY :  She  has a past medical history of Adenomatous colon polyp; Anxiety disorder; Brain stem stroke syndrome (1983); Diverticulosis; Fibroids; High altitude sickness (2008); and Premature menopause.  PAST SURGICAL HISTORY: She  has a past surgical history that includes Breast lumpectomy (1984); Mastectomy (1990); Mastectomy (1996); Cesarean section; Laminectomy; and colonoscopy with polypectomy (2011).  No Known Allergies  No current facility-administered medications on file prior to encounter.    Current Outpatient Prescriptions on File Prior to Encounter  Medication Sig  . FLUoxetine (PROZAC) 20  MG capsule Take 20 mg by mouth daily.  . Oxycodone HCl 10 MG TABS Take 1 tablet by mouth every 6 (six) hours as needed.    FAMILY HISTORY:  Her indicated that the status of her mother is unknown. She indicated that the status of her father is unknown. She indicated that the status of her brother is unknown. She indicated that the status of her maternal grandmother is unknown. She indicated that the status of her neg hx is unknown.    SOCIAL HISTORY: She  reports that she quit smoking about 38 years ago. She has never used smokeless tobacco. She reports that she drinks about 5.4 oz of alcohol per week . She reports that she does not use drugs.  REVIEW OF SYSTEMS:   Cannot obtain due to confusion and poor hearing  SUBJECTIVE:  As above  VITAL SIGNS: BP (!) 137/56 (BP Location: Right Arm)   Pulse (!) 106   Temp 98.9 F (37.2 C) (Oral)   Resp 18   Ht 5\' 2"  (1.575 m)   Wt 106 lb 0.7 oz (48.1 kg)   SpO2 91%   BMI 19.40 kg/m   HEMODYNAMICS:    VENTILATOR SETTINGS:    INTAKE / OUTPUT: I/O last 3 completed shifts: In: 4493.3 [P.O.:360; I.V.:3833.3; IV Piggyback:300] Out: 9528 [Urine:1550]  PHYSICAL EXAMINATION: General:  Elderly female, lying in bed, increased work of breathing Neuro:  Awake, confused, speech is coherent but she is not oriented, moves all 4 extremities HEENT:  Normocephalic atraumatic oropharynx clear neck supple Cardiovascular:  Tachycardic, regular, no systolic murmur, no JVD Lungs:  No breath sounds on the left, increased work of breathing but  no accessory muscle use no paradoxical abdominal movements, clear on the right Abdomen:  Bowel sounds positive, nontender nondistended Musculoskeletal:  Diminished muscular bulk, no bony abnormalities, no bruising over left chest wall Skin:  No bruising is noted above left chest wall  LABS:  BMET  Recent Labs Lab 02/07/17 1221 02/08/17 0527 02/09/17 0541  NA 134* 133* 133*  K 4.1 4.4 4.4  CL 99* 95* 101   CO2 27 28 25   BUN 20 24* 16  CREATININE 0.63 0.70 0.54  GLUCOSE 108* 149* 144*    Electrolytes  Recent Labs Lab 02/07/17 1221 02/08/17 0527 02/09/17 0541  CALCIUM 9.0 9.0 8.9    CBC  Recent Labs Lab 02/08/17 0527 02/09/17 0541 02/10/17 0513  WBC 38.6* 36.8* 34.8*  HGB 12.5 11.2* 9.9*  HCT 36.9 34.9* 31.2*  PLT 521* 473* 471*    Coag's  Recent Labs Lab 02/07/17 1221  INR 1.01    Sepsis Markers  Recent Labs Lab 02/07/17 1205  LATICACIDVEN 0.78    ABG No results for input(s): PHART, PCO2ART, PO2ART in the last 168 hours.  Liver Enzymes  Recent Labs Lab 02/07/17 1221  AST 27  ALT 34  ALKPHOS 73  BILITOT 0.4  ALBUMIN 2.7*    Cardiac Enzymes  Recent Labs Lab 02/09/17 2321  TROPONINI <0.03    Glucose No results for input(s): GLUCAP in the last 168 hours.  Imaging Ct Chest Wo Contrast  Result Date: 02/10/2017 CLINICAL DATA:  Left-sided chest pain with shortness of breath and hypoxia EXAM: CT CHEST WITHOUT CONTRAST TECHNIQUE: Multidetector CT imaging of the chest was performed following the standard protocol without IV contrast. COMPARISON:  Chest x-ray 02/09/2017, 02/07/2017 FINDINGS: Cardiovascular: Limited evaluation without intravenous contrast. Aortic atherosclerosis. No aneurysmal dilatation. Coronary artery calcification. Mild cardiomegaly. Small pericardial effusion at the cardiac apex. Mediastinum/Nodes: Esophagus within normal limits. Mild mediastinal nodes, largest seen in the precarinal space measuring up to 13 mm. No thyroid mass. Midline trachea. Hypodense material within left lower lobe bronchus. Lungs/Pleura: Large left-sided pleural effusion with diffuse atelectasis of left lung and air bronchograms. Mild shift of mediastinal contents to the right. Moderate emphysema in the right upper lobe. Small right pleural effusion Upper Abdomen: Diffuse nodular enlargement of the left adrenal gland. 13 mm low-density nodule in the right adrenal  gland consistent with adenoma. Musculoskeletal: Advanced degenerative changes of the left shoulder. Mild compression deformities of T11 and T12. Thoracic stimulator device posterior to T8. Post mastectomy changes IMPRESSION: 1. Large left pleural effusion with diffuse atelectasis of the left lung. Unable to exclude mass given diffuse consolidation. Hypodense material within the left lower lobe bronchus, could reflect mucus plugging. 2. Mild mediastinal adenopathy, possibly reactive. 3. Cardiomegaly with small pericardial effusion 4. Moderate emphysema Aortic Atherosclerosis (ICD10-I70.0) and Emphysema (ICD10-J43.9). Electronically Signed   By: Donavan Foil M.D.   On: 02/10/2017 03:11   Dg Chest Port 1 View  Result Date: 02/09/2017 CLINICAL DATA:  Left lateral chest pain for 2 days. EXAM: PORTABLE CHEST 1 VIEW COMPARISON:  February 07, 2017 FINDINGS: The left hemithorax is now completely opacified consistent with a large effusion and associated atelectasis. Suggested mild edema on the right. No other acute abnormalities. IMPRESSION: Complete opacification of the left hemithorax, new in the interval. Mild edema seen in the right lung. Electronically Signed   By: Dorise Bullion III M.D   On: 02/09/2017 14:16     STUDIES:  CT chest 02/10/2017 images independently reviewed showing a large left-sided pleural effusion  with likely left lower lobe airway occlusion.  CULTURES: June 22 blood culture  ANTIBIOTICS: June 22 azithromycin June 22 ceftriaxone  SIGNIFICANT EVENTS:   LINES/TUBES:   DISCUSSION: 83 year old female admitted with bronchitis and failure to thrive has developed the sudden onset of a very large pleural effusion. Considering the clinical context of a fall several weeks ago now being treated with heparin I worry that the large left-sided pleural effusion may be a hemothorax. Differential diagnosis includes a parapneumonic effusion but this seems less likely.  ASSESSMENT /  PLAN:  PULMONARY A: Acute respiratory distress Acute bronchitis Large left pleural effusion P:   Diagnostic and therapeutic thoracentesis now by interventional radiology Continue O2 supplementation to maintain O2 saturation greater than 90% Would send pleural effusion fluid for standard analysis: Protein, LDH, albumin, cell count, differential, culture (bacteria), glucose, pH  Son updated bedside by me  Roselie Awkward, MD [place] Pager: [phone] Cell: [phone] After 3pm or if no response, call [phone]    02/10/2017, 10:48 AM

## 2017-02-10 NOTE — Progress Notes (Signed)
CSW contacted by [facility] staff member Loree Fee [phone]) and informed that patient has a bed at SNF. CSW will continue to follow and assist patient with discharge planning to SNF, when medically stable.   Yolanda [name], [place] Social Worker [facility] Cell#: [phone]

## 2017-02-10 NOTE — Progress Notes (Signed)
PT Cancellation Note  Patient Details Name: KORTNIE STOVALL MRN: [id number] DOB: [birth year]   Cancelled Treatment:    Reason Eval/Treat Not Completed: Medical issues which prohibited therapy (pt not breathing well per family, they requested PT try tomorrow. Pt to have thoracentesis later today. Will follow. \)   Philomena Doheny 02/10/2017, 10:51 AM

## 2017-02-10 NOTE — Progress Notes (Signed)
After pt back up from thorocentesis pt requiring more o2.  02 was 86 on 4L when arrived back to room.  Placed patient on 6L and respiratory therapy called, breathing treatment given.  Currently sating 95 on 6L..  MD made aware.
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
## 2017-02-10 NOTE — Care Management Note (Signed)
Case Management Note  Patient Details  Name: Yolanda [name] MRN: [id number] Date of Birth: May 24, 1934  Subjective/Objective:  83 y/o f admitted w/Acute bronchitis. From [facility]. PT recc SNF-CSW following for SNF-[facility].                  Action/Plan:d/c SNF.   Expected Discharge Date:   (UNKNOWN)               Expected Discharge Plan:  Skilled Nursing Facility  In-House Referral:  Clinical Social Work  Discharge planning Services  CM Consult  Post Acute Care Choice:    Choice offered to:     DME Arranged:    DME Agency:     HH Arranged:    [place] Agency:     Status of Service:  In process, will continue to follow  If discussed at Long Length of Stay Meetings, dates discussed:    Additional Comments:  Dessa Phi, RN 02/10/2017, 3:40 PM

## 2017-02-11 ENCOUNTER — Inpatient Hospital Stay (HOSPITAL_COMMUNITY): Payer: 59

## 2017-02-11 DIAGNOSIS — J9601 Acute respiratory failure with hypoxia: Secondary | ICD-10-CM

## 2017-02-11 DIAGNOSIS — J869 Pyothorax without fistula: Secondary | ICD-10-CM

## 2017-02-11 DIAGNOSIS — M533 Sacrococcygeal disorders, not elsewhere classified: Secondary | ICD-10-CM

## 2017-02-11 LAB — BLOOD GAS, ARTERIAL
ACID-BASE EXCESS: 6.4 mmol/L — AB (ref 0.0–2.0)
Bicarbonate: 31.9 mmol/L — ABNORMAL HIGH (ref 20.0–28.0)
FIO2: 80
LHR: 14 {breaths}/min
MECHVT: 380 mL
O2 SAT: 98.7 %
PATIENT TEMPERATURE: 37
PEEP/CPAP: 10 cmH2O
PH ART: 7.395 (ref 7.350–7.450)
PO2 ART: 128 mmHg — AB (ref 83.0–108.0)
pCO2 arterial: 53.2 mmHg — ABNORMAL HIGH (ref 32.0–48.0)

## 2017-02-11 LAB — CBC WITH DIFFERENTIAL/PLATELET
Basophils Absolute: 0 10*3/uL (ref 0.0–0.1)
Basophils Relative: 0 %
Eosinophils Absolute: 0.1 10*3/uL (ref 0.0–0.7)
Eosinophils Relative: 0 %
HCT: 31.1 % — ABNORMAL LOW (ref 36.0–46.0)
Hemoglobin: 9.9 g/dL — ABNORMAL LOW (ref 12.0–15.0)
Lymphocytes Relative: 3 %
Lymphs Abs: 0.9 10*3/uL (ref 0.7–4.0)
MCH: 31.8 pg (ref 26.0–34.0)
MCHC: 31.8 g/dL (ref 30.0–36.0)
MCV: 100 fL (ref 78.0–100.0)
Monocytes Absolute: 1.8 10*3/uL — ABNORMAL HIGH (ref 0.1–1.0)
Monocytes Relative: 6 %
Neutro Abs: 26.6 10*3/uL — ABNORMAL HIGH (ref 1.7–7.7)
Neutrophils Relative %: 91 %
Platelets: 474 10*3/uL — ABNORMAL HIGH (ref 150–400)
RBC: 3.11 MIL/uL — ABNORMAL LOW (ref 3.87–5.11)
RDW: 13.7 % (ref 11.5–15.5)
WBC: 29.3 10*3/uL — ABNORMAL HIGH (ref 4.0–10.5)

## 2017-02-11 LAB — MRSA PCR SCREENING: MRSA by PCR: NEGATIVE

## 2017-02-11 MED ORDER — ORAL CARE MOUTH RINSE
15.0000 mL | Freq: Four times a day (QID) | OROMUCOSAL | Status: DC
  Administered 2017-02-11 – 2017-02-15 (×16): 15 mL via OROMUCOSAL

## 2017-02-11 MED ORDER — FENTANYL CITRATE (PF) 100 MCG/2ML IJ SOLN
INTRAMUSCULAR | Status: AC
  Administered 2017-02-11: 100 ug
  Filled 2017-02-11: qty 4

## 2017-02-11 MED ORDER — SODIUM CHLORIDE 0.9 % IV BOLUS (SEPSIS)
500.0000 mL | Freq: Once | INTRAVENOUS | Status: AC
  Administered 2017-02-11: 500 mL via INTRAVENOUS

## 2017-02-11 MED ORDER — MIDAZOLAM HCL 2 MG/2ML IJ SOLN
INTRAMUSCULAR | Status: AC
  Administered 2017-02-11: 4 mg
  Filled 2017-02-11: qty 4

## 2017-02-11 MED ORDER — CHLORHEXIDINE GLUCONATE 0.12 % MT SOLN
OROMUCOSAL | Status: AC
  Administered 2017-02-11: 15 mL via OROMUCOSAL
  Filled 2017-02-11: qty 15

## 2017-02-11 MED ORDER — GUAIFENESIN 100 MG/5ML PO SOLN
20.0000 mL | Freq: Two times a day (BID) | ORAL | Status: DC
  Administered 2017-02-11 – 2017-02-15 (×8): 400 mg
  Filled 2017-02-11 (×7): qty 20
  Filled 2017-02-11: qty 10

## 2017-02-11 MED ORDER — DEXMEDETOMIDINE HCL IN NACL 400 MCG/100ML IV SOLN
0.0000 ug/kg/h | INTRAVENOUS | Status: DC
  Administered 2017-02-12: 0.5 ug/kg/h via INTRAVENOUS
  Administered 2017-02-12: 1 ug/kg/h via INTRAVENOUS
  Administered 2017-02-12 – 2017-02-13 (×2): 1.2 ug/kg/h via INTRAVENOUS
  Administered 2017-02-13: 1 ug/kg/h via INTRAVENOUS
  Administered 2017-02-13 – 2017-02-14 (×2): 0.9 ug/kg/h via INTRAVENOUS
  Administered 2017-02-15: 0.5 ug/kg/h via INTRAVENOUS
  Filled 2017-02-11 (×8): qty 100

## 2017-02-11 MED ORDER — MIDAZOLAM HCL 2 MG/2ML IJ SOLN
1.0000 mg | INTRAMUSCULAR | Status: DC | PRN
  Administered 2017-02-12 – 2017-02-14 (×11): 1 mg via INTRAVENOUS
  Filled 2017-02-11 (×9): qty 2

## 2017-02-11 MED ORDER — DEXTROSE 5 % IV SOLN
2.0000 g | INTRAVENOUS | Status: DC
  Administered 2017-02-11 – 2017-02-16 (×6): 2 g via INTRAVENOUS
  Filled 2017-02-11 (×7): qty 2

## 2017-02-11 MED ORDER — HEPARIN SODIUM (PORCINE) 5000 UNIT/ML IJ SOLN
5000.0000 [IU] | Freq: Three times a day (TID) | INTRAMUSCULAR | Status: DC
  Administered 2017-02-11 – 2017-02-19 (×19): 5000 [IU] via SUBCUTANEOUS
  Filled 2017-02-11 (×22): qty 1

## 2017-02-11 MED ORDER — DOCUSATE SODIUM 50 MG/5ML PO LIQD: 100.0000 mg | Freq: Two times a day (BID) | ORAL | Status: DC | PRN

## 2017-02-11 MED ORDER — CHLORHEXIDINE GLUCONATE 0.12% ORAL RINSE (MEDLINE KIT)
15.0000 mL | Freq: Two times a day (BID) | OROMUCOSAL | Status: DC
  Administered 2017-02-11 – 2017-02-15 (×8): 15 mL via OROMUCOSAL

## 2017-02-11 MED ORDER — FENTANYL CITRATE (PF) 100 MCG/2ML IJ SOLN
50.0000 ug | INTRAMUSCULAR | Status: AC | PRN
  Administered 2017-02-11 – 2017-02-13 (×3): 50 ug via INTRAVENOUS
  Filled 2017-02-11 (×5): qty 2

## 2017-02-11 MED ORDER — FENTANYL CITRATE (PF) 100 MCG/2ML IJ SOLN
50.0000 ug | INTRAMUSCULAR | Status: DC | PRN
  Administered 2017-02-11 – 2017-02-15 (×27): 50 ug via INTRAVENOUS
  Filled 2017-02-11 (×25): qty 2

## 2017-02-11 MED ORDER — FAMOTIDINE IN NACL 20-0.9 MG/50ML-% IV SOLN
20.0000 mg | Freq: Two times a day (BID) | INTRAVENOUS | Status: DC
  Administered 2017-02-11 – 2017-02-16 (×11): 20 mg via INTRAVENOUS
  Filled 2017-02-11 (×14): qty 50

## 2017-02-11 MED ORDER — DORNASE ALFA 2.5 MG/2.5ML IN SOLN
5.0000 mg | Freq: Two times a day (BID) | RESPIRATORY_TRACT | Status: DC
  Filled 2017-02-11 (×3): qty 5

## 2017-02-11 MED ORDER — SODIUM CHLORIDE 0.9 % IV SOLN
0.0000 ug/kg/h | INTRAVENOUS | Status: DC
  Administered 2017-02-11: 0.4 ug/kg/h via INTRAVENOUS
  Filled 2017-02-11 (×2): qty 2

## 2017-02-11 MED ORDER — SODIUM CHLORIDE 0.9 % IJ SOLN
10.0000 mg | Freq: Two times a day (BID) | INTRAMUSCULAR | Status: DC
  Filled 2017-02-11 (×3): qty 10

## 2017-02-11 MED ORDER — MIDAZOLAM HCL 2 MG/2ML IJ SOLN
1.0000 mg | INTRAMUSCULAR | Status: DC | PRN
  Administered 2017-02-11: 1 mg via INTRAVENOUS
  Filled 2017-02-11 (×4): qty 2

## 2017-02-11 NOTE — Progress Notes (Signed)
Patient is in NRB.  Multiloculated pleural effusion ? Empyema.   CTVS consult called.  PCCM is following D/w patient and her son at bedside.

## 2017-02-11 NOTE — Progress Notes (Signed)
Comments:  Notified by RN that pt has required NRB on and off since shift change. Now requiring constant NRB to keep sats > 90%. No acute resp distress and is comfortable on NRB. Record indicates pt has had slowly increased 02 demand over last 8-10 hours, since paracentesis earlier today requiring efforts to increase 02 delivery. AC reports pt unable to remain on floor w/ constant need for NRB. Will transfer to SDU.  Jeryl Columbia [facility] Pager [phone]

## 2017-02-11 NOTE — Progress Notes (Signed)
   02/11/17 1140  Vitals  BP (!) 143/66  MAP (mmHg) 95  ECG Heart Rate (!) 106  Resp 15  Oxygen Therapy  SpO2 90 %  20 mg etomidate IV, 4 mg Versed IV, 100 mcg fentanyl IV given for intubation per verbal order, Dr. [place] Bells.
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
## 2017-02-11 NOTE — Progress Notes (Signed)
Referring Physician(s): McQuaid,D  Supervising Physician: Arne Cleveland  Patient Status:  [facility] - In-pt  Chief Complaint:  Respiratory failure, multiloculated left pleural effusion/? empyema  Subjective: Patient familiar to IR service from left thoracentesis on 02/10/17 which revealed 390 mL of turbid yellow fluid. Fluid cytology was negative for malignant cells but WBC count was 11,561. Fluid cultures are pending. She has a history of alcohol abuse with recent fall and broken coccyx; she has been bed bound for approximately 5 weeks and was recently admitted on 6/22 with increasing dyspnea and cough. CT of the chest revealed large left effusion with moderate emphysema and cardiomegaly with small pericardial effusion. Ultrasound imaging of left pleural space  revealed multiple loculations/complex effusion suspicious for empyema. Thoracic surgery feels patient is too high risk for VATS. She was intubated today secondary to respiratory distress. Request now received from critical care for left chest drain placement. Past Medical History:  Diagnosis Date  . Adenomatous colon polyp    tubular  . Anxiety disorder    panic attacks, PMH of. Dr  Janna Arch  . 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 Diverticulosis   . Fibroids    Lupron shots  . High altitude sickness 2008  . Premature menopause    due to Lupron   Past Surgical History:  Procedure Laterality Date  . BREAST LUMPECTOMY  1984   radiation , R breast  . CESAREAN SECTION     X 2  . colonoscopy with polypectomy  2011   Dr  Delfin Edis  . LAMINECTOMY     X3 L-Sspine  . MASTECTOMY  1990   R breast  . MASTECTOMY  1996   L      Allergies: Patient has no known allergies.  Medications: Prior to Admission medications   Medication Sig Start Date End Date Taking? Authorizing Provider  FLUoxetine (PROZAC) 20 MG capsule Take 20 mg by mouth daily.   Yes [provider]    naloxegol oxalate (MOVANTIK) 25 MG TABS tablet Take by mouth daily.   Yes [provider]  Oxycodone HCl 10 MG TABS Take 1 tablet by mouth every 6 (six) hours as needed. 11/15/15  Yes [provider]  traZODone (DESYREL) 50 MG tablet Take 50 mg by mouth at bedtime. 02/05/17  Yes [provider]     Vital Signs: BP (!) 114/52   Pulse 94   Temp 97.7 F (36.5 C) (Oral)   Resp 19   Ht 5\' 1"  (1.549 m)   Wt 106 lb 0.7 oz (48.1 kg)   SpO2 100%   BMI 19.40 kg/m   Physical Exam intubated, sedated. Chest with diminished breath sounds on left (mid to lower zones), few crackles on right; heart with regular rate and rhythm; soft, positive bowel sounds; no lower extremity edema  Imaging: Dg Chest 1 View  Result Date: 02/10/2017 CLINICAL DATA:  Left thoracentesis. EXAM: CHEST 1 VIEW COMPARISON:  02/09/2017 FINDINGS: Slightly improved aeration in left lung compared to the previous examination. There continues to be a large amount of pleural-parenchymal disease in the left chest. There is concern for new airspace densities in the right upper lung compared to the recent comparison examination. Patient is rotated towards the right. Evidence for a spinal stimulator. Again noted are surgical clips in the bilateral axillary regions. Severe degenerative changes in the left glenohumeral joint. IMPRESSION: Slightly improved aeration in the left lung following the left thoracentesis. Negative  for a large pneumothorax. There continues to be a large amount of pleural-based disease in the left chest. Concern for increased airspace disease in the right upper lung. Electronically Signed   By: Markus Daft M.D.   On: 02/10/2017 13:45   Ct Chest Wo Contrast  Result Date: 02/10/2017 CLINICAL DATA:  Left-sided chest pain with shortness of breath and hypoxia EXAM: CT CHEST WITHOUT CONTRAST TECHNIQUE: Multidetector CT imaging of the chest was performed following the standard protocol without IV  contrast. COMPARISON:  Chest x-ray 02/09/2017, 02/07/2017 FINDINGS: Cardiovascular: Limited evaluation without intravenous contrast. Aortic atherosclerosis. No aneurysmal dilatation. Coronary artery calcification. Mild cardiomegaly. Small pericardial effusion at the cardiac apex. Mediastinum/Nodes: Esophagus within normal limits. Mild mediastinal nodes, largest seen in the precarinal space measuring up to 13 mm. No thyroid mass. Midline trachea. Hypodense material within left lower lobe bronchus. Lungs/Pleura: Large left-sided pleural effusion with diffuse atelectasis of left lung and air bronchograms. Mild shift of mediastinal contents to the right. Moderate emphysema in the right upper lobe. Small right pleural effusion Upper Abdomen: Diffuse nodular enlargement of the left adrenal gland. 13 mm low-density nodule in the right adrenal gland consistent with adenoma. Musculoskeletal: Advanced degenerative changes of the left shoulder. Mild compression deformities of T11 and T12. Thoracic stimulator device posterior to T8. Post mastectomy changes IMPRESSION: 1. Large left pleural effusion with diffuse atelectasis of the left lung. Unable to exclude mass given diffuse consolidation. Hypodense material within the left lower lobe bronchus, could reflect mucus plugging. 2. Mild mediastinal adenopathy, possibly reactive. 3. Cardiomegaly with small pericardial effusion 4. Moderate emphysema Aortic Atherosclerosis (ICD10-I70.0) and Emphysema (ICD10-J43.9). Electronically Signed   By: Donavan Foil M.D.   On: 02/10/2017 03:11   Ct Pelvis Wo Contrast  Result Date: 02/07/2017 CLINICAL DATA:  83 y.o. female with medical history significant of remote CVA and alcohol abuse, drinks 5-6 glasses of wine per day. Patient brought to the hospital by her 2 kids because of failure to thrive. Her story started about 5 weeks ago when she fell and bruised her tailbone, since then she was unable to ambulate very well, reported spending  most of the time in the bed, had less appetite and oral intake. For the past several days she started to have some cough which is minimally productive, she denied fever and palpitation. EXAM: CT PELVIS WITHOUT CONTRAST TECHNIQUE: Multidetector CT imaging of the pelvis was performed following the standard protocol without intravenous contrast. COMPARISON:  None. FINDINGS: Urinary Tract:  No ureteral dilation.  Bladder is unremarkable. Bowel: No bowel dilation to suggest obstruction. No bowel wall thickening or inflammation. Vascular/Lymphatic: Atherosclerotic calcifications noted along the iliac vessels and lower abdominal aorta. No aneurysm. No adenopathy. Reproductive: Multiple uterine fibroids, several of dense calcification. No adnexal masses. Other:  No pelvic free fluid. Musculoskeletal: There is a mildly comminuted nondisplaced fracture of the base of the coccyx. No other fractures. Both hips show symmetric arthropathic changes with concentric joint space narrowing and marginal osteophytes from the bases of the femoral heads. There are degenerative changes of the visible lower lumbar spine. Bones are diffusely demineralized. IMPRESSION: 1. Comminuted nondisplaced fracture of the base of the coccyx. 2. No other fractures.  No other acute abnormalities. Electronically Signed   By: Lajean Manes M.D.   On: 02/07/2017 21:34   Portable Chest Xray  Result Date: 02/11/2017 CLINICAL DATA:  Status post endotracheal tube placement. Respiratory distress. EXAM: PORTABLE CHEST 1 VIEW COMPARISON:  Chest x-ray of February 11, 2017 at 5:12  a.m. FINDINGS: The endotracheal tube tip lies 5.2 cm above the carina. There remains considerable pleural pleural fluid on the left with some residual aerated left lung. The retrocardiac region on the left remains dense. There is no mediastinal shift. There is confluent interstitial infiltrate in the right upper lobe. The esophagogastric tube tip and proximal port project below the inferior  margin of the image. IMPRESSION: The endotracheal tube tip lies 5.2 cm above the carina. The esophagogastric tube tip and proximal port project below the inferior margin of the image. The infiltrates bilaterally and the left pleural effusion are stable. These results were called by telephone at the time of interpretation on 02/11/2017 at 1:00 pm to Page, RN,, who verbally acknowledged these results. Electronically Signed   By: David  Martinique M.D.   On: 02/11/2017 13:03   Dg Chest Port 1 View  Result Date: 02/11/2017 CLINICAL DATA:  Dyspnea EXAM: PORTABLE CHEST 1 VIEW COMPARISON:  02/10/2017 FINDINGS: Large left pleural collection remains unchanged. Diffuse airspace opacities persist in both lungs. No pneumothorax. IMPRESSION: Diffuse bilateral airspace opacities and prominent left-sided pleural collection, without significant interval change. Electronically Signed   By: Andreas Newport M.D.   On: 02/11/2017 06:07   Dg Chest Port 1 View  Result Date: 02/09/2017 CLINICAL DATA:  Left lateral chest pain for 2 days. EXAM: PORTABLE CHEST 1 VIEW COMPARISON:  February 07, 2017 FINDINGS: The left hemithorax is now completely opacified consistent with a large effusion and associated atelectasis. Suggested mild edema on the right. No other acute abnormalities. IMPRESSION: Complete opacification of the left hemithorax, new in the interval. Mild edema seen in the right lung. Electronically Signed   By: Dorise Bullion III M.D   On: 02/09/2017 14:16   Dg Abd Portable 1v  Result Date: 02/11/2017 CLINICAL DATA:  Orogastric tube placement. EXAM: PORTABLE ABDOMEN - 1 VIEW COMPARISON:  Chest x-ray of today's date FINDINGS: The esophagogastric tube tip and proximal port project in distal and mid portions of the gastric body respectively. The stomach contains a moderate amount of gas. IMPRESSION: The esophagogastric tube tip in proximal port lie in the gastric body. Electronically Signed   By: David  Martinique M.D.   On: 02/11/2017  13:04   US Thoracentesis Asp Pleural Space W/img Guide  Result Date: 02/10/2017 INDICATION: Dyspnea, recent fall with coccygeal fracture, alcohol abuse, leukocytosis, complex left pleural effusion; request made for diagnostic and therapeutic left thoracentesis EXAM: ULTRASOUND GUIDED DIAGNOSTIC AND THERAPEUTIC LEFT THORACENTESIS MEDICATIONS: None. COMPLICATIONS: None immediate. PROCEDURE: An ultrasound guided thoracentesis was thoroughly discussed with the patient's son and questions answered. The benefits, risks, alternatives and complications were also discussed. The patient's son understands and wishes to proceed with the procedure. Written consent was obtained. Ultrasound was performed to localize and mark an adequate pocket of fluid in the left chest. The area was then prepped and draped in the normal sterile fashion. 1% Lidocaine was used for local anesthesia. Under ultrasound guidance a Yueh catheter was introduced. Thoracentesis was performed. The catheter was removed and a dressing applied. FINDINGS: A total of approximately 390 cc of hazy, yellow fluid was removed. Samples were sent to the laboratory as requested by the clinical team. IMPRESSION: Successful ultrasound guided diagnostic and therapeutic left thoracentesis yielding 390 cc of pleural fluid. Due to complexity/significant multiloculations of the left pleural space only the above amount of fluid could be removed at this time. Recommend TCTS consultation . Read by: Rowe Robert, PA-C Electronically Signed   By: Corrie Mckusick  D.O.   On: 02/10/2017 14:04    Labs:  CBC:  Recent Labs  02/08/17 0527 02/09/17 0541 02/10/17 0513 02/11/17 0303  WBC 38.6* 36.8* 34.8* 29.3*  HGB 12.5 11.2* 9.9* 9.9*  HCT 36.9 34.9* 31.2* 31.1*  PLT 521* 473* 471* 474*    COAGS:  Recent Labs  02/07/17 1221  INR 1.01    BMP:  Recent Labs  02/07/17 1221 02/08/17 0527 02/09/17 0541  NA 134* 133* 133*  K 4.1 4.4 4.4  CL 99* 95* 101  CO2  27 28 25   GLUCOSE 108* 149* 144*  BUN 20 24* 16  CALCIUM 9.0 9.0 8.9  CREATININE 0.63 0.70 0.54  GFRNONAA >60 >60 >60  GFRAA >60 >60 >60    LIVER FUNCTION TESTS:  Recent Labs  02/07/17 1221  BILITOT 0.4  AST 27  ALT 34  ALKPHOS 73  PROT 6.5  ALBUMIN 2.7*    Assessment and Plan: Patient with history of alcohol abuse, recent fall with fractured coccyx, bedbound past 5 weeks; recently admitted with increasing dyspnea and cough. Found to have a large left complex/multiloculated effusion on imaging, ? empyema. Thoracentesis performed 6/25 revealed 400 mL of turbid yellow fluid with WBC count of 11,561. Fluid cultures are pending. Cytology negative for malignant cells. Patient intubated 6/26 secondary to respiratory distress. Pt too high risk for VATS. Request received from CCM for image guided left chest drain placement. Imaging studies have been reviewed by Dr. Vernard Gambles. Details/risks of procedure, including but not limited to, internal bleeding, infection, pneumothorax, inability to adequately drain effusion discussed with patient's family with their understanding and consent. Procedure tent planned for 6/27.   Electronically Signed: D. Rowe Robert, PA-C 02/11/2017, 3:31 PM   I spent a total of 30 minutes at the the patient's bedside AND on the patient's hospital floor or unit, greater than 50% of which was counseling/coordinating care for image guided left chest drain placement    Patient ID: Yolanda [name], female   DOB: [birth year], 83 y.o.   MRN: [id number]

## 2017-02-11 NOTE — Progress Notes (Signed)
eLink Physician-Brief Progress Note Patient Name: Yolanda [name] DOB: 1933/09/26 MRN: [id number]   Date of Service  02/11/2017  HPI/Events of Note  Sepsis and VDRF due to empyema. Persistent hypotension following her intubation earlier this afternoon.   eICU Interventions  Repeat IVF bolus now. May need to start pressors.      Intervention Category Major Interventions: Shock - evaluation and management  BYRUM,ROBERT S. 02/11/2017, 4:34 PM

## 2017-02-11 NOTE — Progress Notes (Signed)
Case discussed in detail with Dr. Cyndia Bent. Given the patient's advanced age, bedbound status prior to admission, poor nutrition, multiple comorbid illnesses including alcoholism her surgical risk is exceedingly high. We have decided together that she should not undergo surgery but we will attempt pigtail chest tube placement and fibrinolytic therapy.

## 2017-02-11 NOTE — Progress Notes (Signed)
eLink Physician-Brief Progress Note Patient Name: Yolanda [name] DOB: 01-11-1934 MRN: [id number]   Date of Service  02/11/2017  HPI/Events of Note    eICU Interventions  Stopped trazadone Converted guaifenesin to per tube     Intervention Category Intermediate Interventions: Other:  Dory Demont S. 02/11/2017, 9:38 PM

## 2017-02-11 NOTE — Progress Notes (Signed)
MD made aware of low BP. 500 cc bolus given and told to page NP who saw her today as well. NP paged. Will continue to monitor pt closely.

## 2017-02-11 NOTE — Progress Notes (Signed)
PULMONARY / CRITICAL CARE MEDICINE   Name: Yolanda [name] MRN: [id number] DOB: August 12, 1934    ADMISSION DATE:  02/07/2017 CONSULTATION DATE:  02/10/2017  REFERRING MD:  Carolin Sicks  CHIEF COMPLAINT:  Dyspnea  SUMMARY:  83 y/o F with PMH of ETOH abuse and recent fall (5 wks PTA) with broken coccyx who has been bed bound since admitted 6/22 with increased SOB & dry cough.  CXR 6/24 demonstrated near complete opacification of the left hemithorax. A CT scan of the chest was performed showing a large pleural effusion and airway mucus.  Thoracentesis attempted with 390 ml of hazy yellow fluid > multiple loculations noted in the pleural space.     SUBJECTIVE:  RN reports pt requiring NRB with increased work of breathing.  Primary MD ordered bipap support.  Family at bedside.    VITAL SIGNS: BP (!) 159/72   Pulse (!) 117   Temp 98.4 F (36.9 C) (Oral)   Resp (!) 36   Ht 5\' 2"  (1.575 m)   Wt 106 lb 0.7 oz (48.1 kg)   SpO2 93%   BMI 19.40 kg/m   HEMODYNAMICS:    VENTILATOR SETTINGS:    INTAKE / OUTPUT: I/O last 3 completed shifts: In: 2080 [P.O.:150; I.V.:1330; IV Piggyback:600] Out: 1500 [Urine:1500]  PHYSICAL EXAMINATION: General: thin adult female in NAD HEENT: MM pink/dry, NRB mask in place PSY: appropriate Neuro: Awake, alert, generalized weakness, MAE CV: s1s2 rrr, no m/r/g PULM: even/non-labored, diminished on left with bronchial breath sounds, coarse on R DP:OEUM, non-tender, bsx4 active  Extremities: warm/dry, no edema  Skin: no rashes or lesions  LABS:  BMET  Recent Labs Lab 02/07/17 1221 02/08/17 0527 02/09/17 0541  NA 134* 133* 133*  K 4.1 4.4 4.4  CL 99* 95* 101  CO2 27 28 25   BUN 20 24* 16  CREATININE 0.63 0.70 0.54  GLUCOSE 108* 149* 144*    Electrolytes  Recent Labs Lab 02/07/17 1221 02/08/17 0527 02/09/17 0541  CALCIUM 9.0 9.0 8.9    CBC  Recent Labs Lab 02/09/17 0541 02/10/17 0513 02/11/17 0303  WBC 36.8* 34.8* 29.3*  HGB  11.2* 9.9* 9.9*  HCT 34.9* 31.2* 31.1*  PLT 473* 471* 474*    Coag's  Recent Labs Lab 02/07/17 1221  INR 1.01    Sepsis Markers  Recent Labs Lab 02/07/17 1205  LATICACIDVEN 0.78    ABG No results for input(s): PHART, PCO2ART, PO2ART in the last 168 hours.  Liver Enzymes  Recent Labs Lab 02/07/17 1221  AST 27  ALT 34  ALKPHOS 73  BILITOT 0.4  ALBUMIN 2.7*    Cardiac Enzymes  Recent Labs Lab 02/09/17 2321  TROPONINI <0.03    Glucose No results for input(s): GLUCAP in the last 168 hours.  Imaging Dg Chest 1 View  Result Date: 02/10/2017 CLINICAL DATA:  Left thoracentesis. EXAM: CHEST 1 VIEW COMPARISON:  02/09/2017 FINDINGS: Slightly improved aeration in left lung compared to the previous examination. There continues to be a large amount of pleural-parenchymal disease in the left chest. There is concern for new airspace densities in the right upper lung compared to the recent comparison examination. Patient is rotated towards the right. Evidence for a spinal stimulator. Again noted are surgical clips in the bilateral axillary regions. Severe degenerative changes in the left glenohumeral joint. IMPRESSION: Slightly improved aeration in the left lung following the left thoracentesis. Negative for a large pneumothorax. There continues to be a large amount of pleural-based disease in the left chest.  Concern for increased airspace disease in the right upper lung. Electronically Signed   By: Markus Daft M.D.   On: 02/10/2017 13:45   Dg Chest Port 1 View  Result Date: 02/11/2017 CLINICAL DATA:  Dyspnea EXAM: PORTABLE CHEST 1 VIEW COMPARISON:  02/10/2017 FINDINGS: Large left pleural collection remains unchanged. Diffuse airspace opacities persist in both lungs. No pneumothorax. IMPRESSION: Diffuse bilateral airspace opacities and prominent left-sided pleural collection, without significant interval change. Electronically Signed   By: Andreas Newport M.D.   On: 02/11/2017  06:07   US Thoracentesis Asp Pleural Space W/img Guide  Result Date: 02/10/2017 INDICATION: Dyspnea, recent fall with coccygeal fracture, alcohol abuse, leukocytosis, complex left pleural effusion; request made for diagnostic and therapeutic left thoracentesis EXAM: ULTRASOUND GUIDED DIAGNOSTIC AND THERAPEUTIC LEFT THORACENTESIS MEDICATIONS: None. COMPLICATIONS: None immediate. PROCEDURE: An ultrasound guided thoracentesis was thoroughly discussed with the patient's son and questions answered. The benefits, risks, alternatives and complications were also discussed. The patient's son understands and wishes to proceed with the procedure. Written consent was obtained. Ultrasound was performed to localize and mark an adequate pocket of fluid in the left chest. The area was then prepped and draped in the normal sterile fashion. 1% Lidocaine was used for local anesthesia. Under ultrasound guidance a Yueh catheter was introduced. Thoracentesis was performed. The catheter was removed and a dressing applied. FINDINGS: A total of approximately 390 cc of hazy, yellow fluid was removed. Samples were sent to the laboratory as requested by the clinical team. IMPRESSION: Successful ultrasound guided diagnostic and therapeutic left thoracentesis yielding 390 cc of pleural fluid. Due to complexity/significant multiloculations of the left pleural space only the above amount of fluid could be removed at this time. Recommend TCTS consultation . Read by: Rowe Robert, PA-C Electronically Signed   By: Corrie Mckusick D.O.   On: 02/10/2017 14:04     STUDIES:  CT chest 06/25 >> large left-sided pleural effusion with likely left lower lobe airway occlusion. Pleural Fluid 6/25 >> glucose <20, WBC 11,561, lymph 8, turbid/yellow Pleural Cytology 6/25 >>   CULTURES: BCx2 6/22 >>  Pleural Culture 6/25 >>    ANTIBIOTICS: Azithro 6/22 >>  Ceftriaxone 6/22 >>   SIGNIFICANT EVENTS: 6/22  Admit 6/25  PCCM consulted 6/26  Tx to  ICU, NRB, increased WOB.  CVTS consulted   LINES/TUBES:   DISCUSSION: 83 year old female admitted with bronchitis and failure to thrive.  She developed a rather acute onset of a large pleural effusion.  S/P Thora on 6/25 with noted multiple loculations on [place] assessment with concern for empyema     ASSESSMENT / PLAN:  PULMONARY A: Acute Hypoxic Respiratory Failure  Acute bronchitis Large Left Pleural Effusion - loculated on [place] assessment per IR, concern for empyema  P:   Follow pleural fluid as above CVTS consulted per primary  Doubt she will be a surgical candidate for VATS Discussed option of elective intubation with chest tube vs surgery vs conservative management with sons at bedside.  They would like to hear from CVTS before making decisions.   O2 as needed to support sats > 92% Concern she may need intubation in next few hours Follow intermittent CXR  Pulmonary hygiene as able - IS, mobilize  ABX as above, D5/x  Noe Gens, NP-C [facility] Pgr: [phone] or if no answer [phone] 02/11/2017, 9:07 AM

## 2017-02-11 NOTE — Progress Notes (Signed)
RT note: RN aware of Pulmozyme(dornase alpha) scheduled for 2000.

## 2017-02-11 NOTE — Procedures (Signed)
Intubation Procedure Note Yolanda [name] [id number] [birth year]  Procedure: Intubation Indications: Airway protection and maintenance  Procedure Details Consent: Risks of procedure as well as the alternatives and risks of each were explained to the (patient/caregiver).  Consent for procedure obtained. Time Out: Verified patient identification, verified procedure, site/side was marked, verified correct patient position, special equipment/implants available, medications/allergies/relevent history reviewed, required imaging and test results available.  Performed  Drugs Etomidate 20mg , Versed 4mg  IV, Fentanyl 171mcg IV DL x 1 with GS 3 blade Grade 1 view 7.5 ET tube passed through cords under direct visualization Placement confirmed with bilateral breath sounds, positive EtCO2 change and smoke in tube   Evaluation Hemodynamic Status: BP stable throughout; O2 sats: stable throughout Patient's Current Condition: stable Complications: No apparent complications Patient did tolerate procedure well. Chest X-ray ordered to verify placement.  CXR: pending.   Simonne Maffucci 02/11/2017

## 2017-02-11 NOTE — Progress Notes (Signed)
Pharmacy Antibiotic Note  Yolanda [name] is a 83 y.o. female admitted on 02/07/2017 with c/o cough and subsequently started to azithromycin and ceftriaxone for acute bronchitis. Chest CT on 6/25 showed large left pleural effusion --> s/p thoracentesis on 6/25.  To change ceftriaxone to cefepime on 6/26 for suspected empyema.   Plan: - cefepime 2gm IV q24h - continue azithromycin 500 mg IV q24h per MD - monitor renal function and clinical status  _____________________________  Height: 5\' 2"  (157.5 cm) Weight: 106 lb 0.7 oz (48.1 kg) IBW/kg (Calculated) : 50.1  Temp (24hrs), Avg:98.4 F (36.9 C), Min:97.7 F (36.5 C), Max:99.5 F (37.5 C)   Recent Labs Lab 02/07/17 1205 02/07/17 1221 02/08/17 0527 02/09/17 0541 02/10/17 0513 02/11/17 0303  WBC  --  20.3* 38.6* 36.8* 34.8* 29.3*  CREATININE  --  0.63 0.70 0.54  --   --   LATICACIDVEN 0.78  --   --   --   --   --     Estimated Creatinine Clearance: 42.6 mL/min (by C-G formula based on SCr of 0.54 mg/dL).    No Known Allergies  Antimicrobials this admission:  6/22 CTX>> 6/26 6/22 azithro>> 6/26 cefepime>>  Dose adjustments this admission:  --  Microbiology results:  6/22 BCx x2:  6/25 pleural fluid: 6/26 MRSA PCR: neg  Thank you for allowing pharmacy to be a part of this patient's care.  Lynelle Doctor 02/11/2017 11:25 AM

## 2017-02-11 NOTE — Progress Notes (Signed)
Pt arrived to ICU room 1226 from [place].  Pt in no acute distress, tolerating non rebreather well. Sats in mid 90's. Noted on arrival that pt had hearing aid batteries in belongings but no hearing aid.  Notified 4th floor RN who states she packed her belongings and put the batteries in but no hearing aid was found.  Under admission belongings a left hearing aid was noted, no notation of it being taken home by family.  Went through linen pt was brought in on and no hearing aid was found. 4th floor RN notified.  She was going to notify son of transfer and will ask about hearing aid. Charge nurse, Gilmer Mor, notified. Kharma Sampsel P Sp[place] Weight

## 2017-02-12 ENCOUNTER — Inpatient Hospital Stay (HOSPITAL_COMMUNITY): Payer: 59

## 2017-02-12 DIAGNOSIS — J869 Pyothorax without fistula: Secondary | ICD-10-CM

## 2017-02-12 LAB — BASIC METABOLIC PANEL
Anion gap: 6 (ref 5–15)
BUN: 18 mg/dL (ref 6–20)
CO2: 30 mmol/L (ref 22–32)
CREATININE: 0.43 mg/dL — AB (ref 0.44–1.00)
Calcium: 8.9 mg/dL (ref 8.9–10.3)
Chloride: 100 mmol/L — ABNORMAL LOW (ref 101–111)
Glucose, Bld: 120 mg/dL — ABNORMAL HIGH (ref 65–99)
Potassium: 4.3 mmol/L (ref 3.5–5.1)
SODIUM: 136 mmol/L (ref 135–145)

## 2017-02-12 LAB — MAGNESIUM
MAGNESIUM: 1.9 mg/dL (ref 1.7–2.4)
Magnesium: 1.7 mg/dL (ref 1.7–2.4)
Magnesium: 2.1 mg/dL (ref 1.7–2.4)

## 2017-02-12 LAB — GLUCOSE, CAPILLARY
GLUCOSE-CAPILLARY: 114 mg/dL — AB (ref 65–99)
Glucose-Capillary: 124 mg/dL — ABNORMAL HIGH (ref 65–99)
Glucose-Capillary: 199 mg/dL — ABNORMAL HIGH (ref 65–99)
Glucose-Capillary: 137 mg/dL — ABNORMAL HIGH (ref 65–99)

## 2017-02-12 LAB — CBC WITH DIFFERENTIAL/PLATELET
BASOS PCT: 0 %
Basophils Absolute: 0 10*3/uL (ref 0.0–0.1)
EOS ABS: 0 10*3/uL (ref 0.0–0.7)
Eosinophils Relative: 0 %
HCT: 27.8 % — ABNORMAL LOW (ref 36.0–46.0)
Hemoglobin: 9 g/dL — ABNORMAL LOW (ref 12.0–15.0)
Lymphocytes Relative: 5 %
Lymphs Abs: 1.3 10*3/uL (ref 0.7–4.0)
MCH: 31.5 pg (ref 26.0–34.0)
MCHC: 32.4 g/dL (ref 30.0–36.0)
MCV: 97.2 fL (ref 78.0–100.0)
MONO ABS: 1.5 10*3/uL — AB (ref 0.1–1.0)
MONOS PCT: 6 %
Neutro Abs: 21.4 10*3/uL — ABNORMAL HIGH (ref 1.7–7.7)
Neutrophils Relative %: 89 %
PLATELETS: 433 10*3/uL — AB (ref 150–400)
RBC: 2.86 MIL/uL — ABNORMAL LOW (ref 3.87–5.11)
RDW: 13.8 % (ref 11.5–15.5)
WBC: 24.3 10*3/uL — ABNORMAL HIGH (ref 4.0–10.5)

## 2017-02-12 LAB — GRAM STAIN

## 2017-02-12 LAB — PHOSPHORUS
PHOSPHORUS: 3.2 mg/dL (ref 2.5–4.6)
Phosphorus: 3.1 mg/dL (ref 2.5–4.6)

## 2017-02-12 MED ORDER — FENTANYL CITRATE (PF) 100 MCG/2ML IJ SOLN
INTRAMUSCULAR | Status: AC | PRN
  Administered 2017-02-12: 50 ug via INTRAVENOUS

## 2017-02-12 MED ORDER — VITAL AF 1.2 CAL PO LIQD
1000.0000 mL | ORAL | Status: DC
  Administered 2017-02-12 – 2017-02-13 (×2): 1000 mL
  Filled 2017-02-12 (×5): qty 1000

## 2017-02-12 MED ORDER — STERILE WATER FOR INJECTION IJ SOLN
5.0000 mg | RESPIRATORY_TRACT | Status: DC
  Administered 2017-02-12 (×2): 5 mg via INTRAPLEURAL
  Filled 2017-02-12 (×3): qty 5

## 2017-02-12 MED ORDER — LIDOCAINE HCL (PF) 1 % IJ SOLN
INTRAMUSCULAR | Status: AC | PRN
  Administered 2017-02-12: 10 mL via INTRADERMAL

## 2017-02-12 MED ORDER — HYDRALAZINE HCL 20 MG/ML IJ SOLN
10.0000 mg | INTRAMUSCULAR | Status: DC | PRN
  Administered 2017-02-12 – 2017-02-16 (×2): 10 mg via INTRAVENOUS
  Filled 2017-02-12 (×2): qty 1

## 2017-02-12 MED ORDER — VITAL HIGH PROTEIN PO LIQD
1000.0000 mL | ORAL | Status: DC
  Administered 2017-02-12: 1000 mL
  Filled 2017-02-12: qty 1000

## 2017-02-12 MED ORDER — SODIUM CHLORIDE 0.9 % IJ SOLN
10.0000 mg | INTRAMUSCULAR | Status: DC
  Administered 2017-02-12 (×2): 10 mg via INTRAPLEURAL
  Filled 2017-02-12 (×3): qty 10

## 2017-02-12 MED ORDER — FENTANYL CITRATE (PF) 100 MCG/2ML IJ SOLN
INTRAMUSCULAR | Status: AC
  Filled 2017-02-12: qty 4

## 2017-02-12 MED ORDER — PRO-STAT SUGAR FREE PO LIQD
30.0000 mL | Freq: Two times a day (BID) | ORAL | Status: DC
  Administered 2017-02-12: 30 mL
  Filled 2017-02-12: qty 30

## 2017-02-12 MED ORDER — SODIUM CHLORIDE 0.9 % IJ SOLN
10.0000 mg | INTRAMUSCULAR | Status: DC
  Filled 2017-02-12: qty 10

## 2017-02-12 MED ORDER — STERILE WATER FOR INJECTION IJ SOLN
5.0000 mg | RESPIRATORY_TRACT | Status: DC
  Filled 2017-02-12: qty 5

## 2017-02-12 MED ORDER — IPRATROPIUM-ALBUTEROL 0.5-2.5 (3) MG/3ML IN SOLN
3.0000 mL | Freq: Four times a day (QID) | RESPIRATORY_TRACT | Status: DC
  Administered 2017-02-12 – 2017-02-17 (×23): 3 mL via RESPIRATORY_TRACT
  Filled 2017-02-12 (×23): qty 3

## 2017-02-12 MED ORDER — STERILE WATER FOR INJECTION IJ SOLN
5.0000 mg | Freq: Two times a day (BID) | RESPIRATORY_TRACT | Status: DC
  Filled 2017-02-12: qty 5

## 2017-02-12 MED ORDER — SODIUM CHLORIDE 0.9 % IJ SOLN
10.0000 mg | Freq: Two times a day (BID) | INTRAMUSCULAR | Status: DC
  Filled 2017-02-12: qty 10

## 2017-02-12 NOTE — Procedures (Addendum)
Left Chest Tube placement CT guided 30f  74ml aspirate for GS, C&S No complication No blood loss. See complete dictation in [facility]  D. Arne Cleveland MD Main # [phone] Pager  [phone]

## 2017-02-12 NOTE — Progress Notes (Signed)
PT Cancellation Note  Patient Details Name: Yolanda [name] MRN: [id number] DOB: 11/07/1933   Cancelled Treatment:     PT deferred this date - RN advises pt has been placed on vent.  Will follow.   Temika Sutphin 02/12/2017, 12:00 PM

## 2017-02-12 NOTE — Progress Notes (Addendum)
eLink Physician-Brief Progress Note Patient Name: Yolanda [name] DOB: 1933/11/12 MRN: [id number]   Date of Service  02/12/2017  HPI/Events of Note    eICU Interventions  Supervised RN while pushing Alteplace via Chest tube; awaiting pharmacy preparation of dornase.  Addendum: RN informed they are not allowed to push chest tube meds. Instructed RN to unclamp chest tube 1 hour after pushing first alteplace.      Intervention Category Major Interventions: Procedure - evaluation and supervision  Laverle Hobby 02/12/2017, 8:17 PM

## 2017-02-12 NOTE — Progress Notes (Signed)
PULMONARY / CRITICAL CARE MEDICINE   Name: Yolanda [name] MRN: [id number] DOB: 1934-04-11    ADMISSION DATE:  02/07/2017 CONSULTATION DATE:  02/10/2017  REFERRING MD:  Carolin Sicks  CHIEF COMPLAINT:  Dyspnea  SUMMARY:  83 y/o F with PMH of ETOH abuse and recent fall (5 wks PTA) with broken coccyx who has been bed bound since admitted 6/22 with increased SOB & dry cough.  CXR 6/24 demonstrated near complete opacification of the left hemithorax. A CT scan of the chest was performed showing a large pleural effusion and airway mucus.  Thoracentesis attempted with 390 ml of hazy yellow fluid > multiple loculations noted in the pleural space.     SUBJECTIVE:  RN reports pt pending IR placement of left chest tube.  Required precedex overnight.  No acute events.      VITAL SIGNS: BP 128/64   Pulse 80   Temp 98.3 F (36.8 C) (Axillary)   Resp (!) 23   Ht 5\' 1"  (1.549 m)   Wt 106 lb 0.7 oz (48.1 kg)   SpO2 98%   BMI 19.40 kg/m   HEMODYNAMICS:    VENTILATOR SETTINGS: Vent Mode: PRVC FiO2 (%):  [60 %-100 %] 60 % Set Rate:  [14 bmp] 14 bmp Vt Set:  [380 mL] 380 mL PEEP:  [10 cmH20] 10 cmH20 Plateau Pressure:  [22 cmH20-25 cmH20] 25 cmH20  INTAKE / OUTPUT: I/O last 3 completed shifts: In: 991.8 [P.O.:30; I.V.:661.8; IV Piggyback:300] Out: 1950 [Urine:1415; Emesis/NG output:50]  PHYSICAL EXAMINATION: General:  Thin elderly female in NAD on vent HEENT: MM pink/moist, ETT PSY: calm Neuro: opens eyes to voice, follows commands, MAE CV: s1s2 rrr, no m/r/g PULM: even/non-labored, lungs bilaterally clear on R, improved air movement on L, diminished L lower with basilar crackles DT:OIZT, non-tender, bsx4 active  Extremities: warm/dry, no edema  Skin: no rashes or lesions  LABS:  BMET  Recent Labs Lab 02/08/17 0527 02/09/17 0541 02/12/17 0254  NA 133* 133* 136  K 4.4 4.4 4.3  CL 95* 101 100*  CO2 28 25 30   BUN 24* 16 18  CREATININE 0.70 0.54 0.43*  GLUCOSE 149* 144* 120*     Electrolytes  Recent Labs Lab 02/08/17 0527 02/09/17 0541 02/12/17 0254  CALCIUM 9.0 8.9 8.9  MG  --   --  1.9    CBC  Recent Labs Lab 02/10/17 0513 02/11/17 0303 02/12/17 0254  WBC 34.8* 29.3* 24.3*  HGB 9.9* 9.9* 9.0*  HCT 31.2* 31.1* 27.8*  PLT 471* 474* 433*    Coag's  Recent Labs Lab 02/07/17 1221  INR 1.01    Sepsis Markers  Recent Labs Lab 02/07/17 1205  LATICACIDVEN 0.78    ABG  Recent Labs Lab 02/11/17 1548  PHART 7.395  PCO2ART 53.2*  PO2ART 128*    Liver Enzymes  Recent Labs Lab 02/07/17 1221  AST 27  ALT 34  ALKPHOS 73  BILITOT 0.4  ALBUMIN 2.7*    Cardiac Enzymes  Recent Labs Lab 02/09/17 2321  TROPONINI <0.03    Glucose No results for input(s): GLUCAP in the last 168 hours.  Imaging Dg Chest Port 1 View  Result Date: 02/12/2017 CLINICAL DATA:  Respiratory failure. EXAM: PORTABLE CHEST 1 VIEW COMPARISON:  02/11/2017.  02/10/2017. FINDINGS: Endotracheal tube 2.4 cm above the carina. NG tube tip below left hemidiaphragm. Overlying leads are in stable position. Heart size stable. Persistent bilateral pulmonary airspace opacities and left-sided prominent pleural effusion again noted without interim change. No pneumothorax .  Surgical clips right axilla . IMPRESSION: 1. Endotracheal tube tip 2.4 cm above the carina on today's exam. NG tube tip noted below left hemidiaphragm. 2. Persistent bilateral pulmonary airspace passes and left-sided prominent pleural effusion again noted without interim change . Electronically Signed   By: Marcello Moores  Register   On: 02/12/2017 06:46   Portable Chest Xray  Result Date: 02/11/2017 CLINICAL DATA:  Status post endotracheal tube placement. Respiratory distress. EXAM: PORTABLE CHEST 1 VIEW COMPARISON:  Chest x-ray of February 11, 2017 at 5:12 a.m. FINDINGS: The endotracheal tube tip lies 5.2 cm above the carina. There remains considerable pleural pleural fluid on the left with some residual aerated  left lung. The retrocardiac region on the left remains dense. There is no mediastinal shift. There is confluent interstitial infiltrate in the right upper lobe. The esophagogastric tube tip and proximal port project below the inferior margin of the image. IMPRESSION: The endotracheal tube tip lies 5.2 cm above the carina. The esophagogastric tube tip and proximal port project below the inferior margin of the image. The infiltrates bilaterally and the left pleural effusion are stable. These results were called by telephone at the time of interpretation on 02/11/2017 at 1:00 pm to Page, RN,, who verbally acknowledged these results. Electronically Signed   By: David  Martinique M.D.   On: 02/11/2017 13:03   Dg Abd Portable 1v  Result Date: 02/11/2017 CLINICAL DATA:  Orogastric tube placement. EXAM: PORTABLE ABDOMEN - 1 VIEW COMPARISON:  Chest x-ray of today's date FINDINGS: The esophagogastric tube tip and proximal port project in distal and mid portions of the gastric body respectively. The stomach contains a moderate amount of gas. IMPRESSION: The esophagogastric tube tip in proximal port lie in the gastric body. Electronically Signed   By: David  Martinique M.D.   On: 02/11/2017 13:04     STUDIES:  CT chest 06/25 >> large left-sided pleural effusion with likely left lower lobe airway occlusion. Pleural Fluid 6/25 >> glucose <20, WBC 11,561, lymph 8, turbid/yellow Pleural Cytology 6/25 >> acute inflammation, no malignant cells identified   CULTURES: BCx2 6/22 >>  Pleural Culture 6/25 >>    ANTIBIOTICS: Azithro 6/22 >>  Ceftriaxone 6/22 >> 6/26 Cefepime 6/26 >>   SIGNIFICANT EVENTS: 6/22  Admit 6/25  PCCM consulted 6/26  Tx to ICU, NRB, increased WOB.  CVTS consulted 6/27  IR placement of small bore chest tube  LINES/TUBES: ETT 6/26 >>   DISCUSSION: 83 year old female admitted with bronchitis and failure to thrive.  She developed a rather acute onset of a large pleural effusion.  S/P Thora on  6/25 with noted multiple loculations on [place] assessment with concern for empyema     ASSESSMENT / PLAN:  PULMONARY A: Acute Hypoxic Respiratory Failure  Acute bronchitis Large Left Pleural Effusion - loculated on [place] assessment per IR, concern for empyema  P:   PRVC 8 cc/kg  Wean PEEP / FiO2 for sats > 90% Appreciate IR assistance for chest tube placement  Not a candidate for CVTS intervention Intermittent CXR  Begin tPA, Pulmozyme once chest tube placed  Continue ABX as above, D6/x  Follow pleural cultures  CARDIOVASCULAR A: HTN - suspect pain / agitation related, normal at baseline P: Monitor BP in ICU Tele monitoring   HEME A: Anemia - suspect of chronic disease / ETOH use  P: Monitor CBC  Transfuse per ICU guidelines Heparin for DVT prophylaxis   GI: A: Protein Calorie Malnutrition P: Begin TF once pt returned from IR  NPO  Pepcid for SUP  Colace BID PRN  PRN zofran   NEURO A: ETOH Abuse  Anxiety  Chronic Pain  P: RASS Goal: 0 to -1  Continue precedex gtt Monitor for evidence of withdrawal  PRN fentanyl + versed Continue prozac Thiamine, folate  Noe Gens, NP-C [facility] Pgr: [phone] or if no answer [phone] 02/12/2017, 8:10 AM

## 2017-02-12 NOTE — Progress Notes (Signed)
Nutrition Follow-up  DOCUMENTATION CODES:   Non-severe (moderate) malnutrition in context of chronic illness  INTERVENTION:   Monitor magnesium, potassium, and phosphorus daily for at least 3 days, MD to replete as needed, as pt is at risk for refeeding syndrome given poor PO x 5 days.  D/C VHP  Initiate Vital AF 1.2 @ 25 ml/hr via OGT and increase by 10 ml every 4 hours to goal rate of 45 ml/hr.   Tube feeding regimen provides 1296 kcal (106% of needs), 81 grams of protein, and 875 ml of H2O.   RD will continue to monitor  NUTRITION DIAGNOSIS:   Malnutrition (moderate) related to chronic illness (ETOH abuse) as evidenced by mild depletion of body fat, moderate depletions of muscle mass.  Ongoing.  GOAL:   Patient will meet greater than or equal to 90% of their needs  Progressing.  MONITOR:   Vent status, Labs, Weight trends, I & O's  REASON FOR ASSESSMENT:   Consult Enteral/tube feeding initiation and management  ASSESSMENT:   83 year old female with history of prior stroke, alcohol abuse and drinks about 6 glasses of wine everyday, fall sustaining coccygeal fracture about 5 weeks ago brought in by patient's son further evaluation of failure to thrive, cough. In the ER patient was found to have hypoxia in room air with leukocytosis. Chest x-ray with no acute finding. Treated with antibiotics and admitted for further evaluation.  Patient initially assessed 6/24. At that time pt was eating minimally but stated that her appetite was improving. Last recorded intake was on 6/25: 50% of dinner meal. Given that her intake has been poor since admission, would recommend monitor for refeeding syndrome.   Pt was intubated on 6/26 d/t acute respiratory failure with hypoxemia. Pt now s/p left chest tube placement.  Patient is currently intubated on ventilator support MV: 9 L/min Temp (24hrs), Avg:98.5 F (36.9 C), Min:97.4 F (36.3 C), Max:99.8 F (37.7 C)  Labs reviewed.  Medications: Folic acid tablet daily, Multivitamin with minerals daily, Miralax packet daily, Thiamine tablet daily  Diet Order:  Diet NPO time specified  Skin:  Reviewed, no issues  Last BM:  PTA  Height:   Ht Readings from Last 1 Encounters:  02/11/17 5\' 1"  (1.549 m)    Weight:   Wt Readings from Last 1 Encounters:  02/07/17 106 lb 0.7 oz (48.1 kg)    Ideal Body Weight:  50 kg  BMI:  Body mass index is 19.4 kg/m.  Estimated Nutritional Needs:   Kcal:  1450-1650  Protein:  65-75g  Fluid:  1.6L/day  EDUCATION NEEDS:   No education needs identified at this time  Clayton Bibles, MS, RD, LDN Pager: [phone] After Hours Pager: [phone]

## 2017-02-13 ENCOUNTER — Inpatient Hospital Stay (HOSPITAL_COMMUNITY): Payer: 59

## 2017-02-13 LAB — BASIC METABOLIC PANEL
ANION GAP: 7 (ref 5–15)
BUN: 32 mg/dL — ABNORMAL HIGH (ref 6–20)
CHLORIDE: 100 mmol/L — AB (ref 101–111)
CO2: 28 mmol/L (ref 22–32)
CREATININE: 0.47 mg/dL (ref 0.44–1.00)
Calcium: 8.4 mg/dL — ABNORMAL LOW (ref 8.9–10.3)
GFR calc non Af Amer: >60 mL/min (ref >60)
Glucose, Bld: 161 mg/dL — ABNORMAL HIGH (ref 65–99)
Potassium: 4 mmol/L (ref 3.5–5.1)
SODIUM: 135 mmol/L (ref 135–145)

## 2017-02-13 LAB — GLUCOSE, CAPILLARY
GLUCOSE-CAPILLARY: 135 mg/dL — AB (ref 65–99)
GLUCOSE-CAPILLARY: 163 mg/dL — AB (ref 65–99)
GLUCOSE-CAPILLARY: 184 mg/dL — AB (ref 65–99)
Glucose-Capillary: 126 mg/dL — ABNORMAL HIGH (ref 65–99)
Glucose-Capillary: 150 mg/dL — ABNORMAL HIGH (ref 65–99)
Glucose-Capillary: 156 mg/dL — ABNORMAL HIGH (ref 65–99)

## 2017-02-13 LAB — CBC
HCT: 28.7 % — ABNORMAL LOW (ref 36.0–46.0)
HEMOGLOBIN: 9.5 g/dL — AB (ref 12.0–15.0)
MCH: 31.3 pg (ref 26.0–34.0)
MCHC: 33.1 g/dL (ref 30.0–36.0)
MCV: 94.4 fL (ref 78.0–100.0)
PLATELETS: 505 10*3/uL — AB (ref 150–400)
RBC: 3.04 MIL/uL — AB (ref 3.87–5.11)
RDW: 13.7 % (ref 11.5–15.5)
WBC: 18 10*3/uL — AB (ref 4.0–10.5)

## 2017-02-13 LAB — CULTURE, BLOOD (ROUTINE X 2)
Culture: NO GROWTH
Culture: NO GROWTH
SPECIAL REQUESTS: ADEQUATE
Special Requests: ADEQUATE

## 2017-02-13 LAB — MAGNESIUM
Magnesium: 1.8 mg/dL (ref 1.7–2.4)
Magnesium: 1.7 mg/dL (ref 1.7–2.4)

## 2017-02-13 LAB — PHOSPHORUS
Phosphorus: 3 mg/dL (ref 2.5–4.6)
Phosphorus: 2.2 mg/dL — ABNORMAL LOW (ref 2.5–4.6)

## 2017-02-13 MED ORDER — ADULT MULTIVITAMIN LIQUID CH
15.0000 mL | Freq: Every day | ORAL | Status: DC
  Administered 2017-02-13 – 2017-02-14 (×2): 15 mL
  Filled 2017-02-13 (×3): qty 15

## 2017-02-13 NOTE — Progress Notes (Signed)
PT Cancellation Note  Patient Details Name: Yolanda [name] MRN: [id number] DOB: June 05, 1934   Cancelled Treatment:     PT deferred, pt continues on vent.  Will follow.   Jariah Jarmon 02/13/2017, 7:04 AM

## 2017-02-13 NOTE — Progress Notes (Signed)
OT Cancellation Note  Patient Details Name: Yolanda [name] MRN: [id number] DOB: 1934/08/16   Cancelled Treatment:    Reason Eval/Treat Not Completed: Medical issues which prohibited therapy.  Pt is on vent. Will check another day.  Wilbur Labuda 02/13/2017, 7:07 AM  Lesle Chris, OTR/L [phone] 02/13/2017

## 2017-02-13 NOTE — Progress Notes (Signed)
Nutrition Follow-up  DOCUMENTATION CODES:   Non-severe (moderate) malnutrition in context of chronic illness  INTERVENTION:  - Continue Vital AF 1.2 @ 45 mL/hr via OGT. - Will change multivitamin to liquid per OGT.  NUTRITION DIAGNOSIS:   Malnutrition (moderate) related to chronic illness (ETOH abuse) as evidenced by mild depletion of body fat, moderate depletions of muscle mass. -ongoing  GOAL:   Patient will meet greater than or equal to 90% of their needs -met with current TF regimen   MONITOR:   Vent status, TF tolerance, Weight trends, Labs, I & O's  ASSESSMENT:   83 year old female with history of prior stroke, alcohol abuse and drinks about 6 glasses of wine everyday, fall sustaining coccygeal fracture about 5 weeks ago brought in by patient's son further evaluation of failure to thrive, cough. In the ER patient was found to have hypoxia in room air with leukocytosis. Chest x-ray with no acute finding. Treated with antibiotics and admitted for further evaluation.  6/28 Pt remains intubated with OGT in place. Estimated kcal need updated this AM and based on admission weight (48.1 kg); new weight obtained this AM is is +3.6 kg from admission weight. Pt receiving Vital AF 1.2 @ 45 mL/hr which provides 1296 kcal, 81 grams of protein (108% maximum estimated protein need), and 876 mL free water. Will continue this order; labs this AM show no signs of refeeding with TF at goal rate.  Patient is currently intubated on ventilator support MV: 10 L/min Temp (24hrs), Avg:99.1 F (37.3 C), Min:98.2 F (36.8 C), Max:99.8 F (37.7 C) BP: 147/58 and MAP: 88  Medications reviewed; 20 mg IV Pepcid BID, 1 mg oral folic acid/day, daily multivitamin with minerals, 1 packet Miralax/day, 100 mg thiamine/day.  Labs reviewed; CBGs: 126 and 163 mg/dL this AM, Cl: 100 mmol/L, BUN: 32 mg/dL, Ca: 8.4 mg/dL.  Drip: Precedex @ 1 mcg/kg/hr.   6/27 - Last recorded intake was on 6/25: 50% of dinner  meal.  - Given that her intake has been poor since admission, would recommend monitor for refeeding syndrome.  - Pt was intubated on 6/26 d/t acute respiratory failure with hypoxemia.  - Pt now s/p left chest tube placement.  Patient is currently intubated on ventilator support MV: 9 L/min Temp (24hrs), Avg:98.5 F (36.9 C), Min:97.4 F (36.3 C), Max:99.8 F (37.7 C)    6/24 - Patient in room with no family at bedside.  - Pt states that she is eating with no issues.  - Pt with snacks at bedside such as watermelon, croissants, etc.  - Pt ate scrambled eggs and bacon this morning for breakfast.  - Pt states her children bring her food from home.  - PO intake: 15-50%.  - Pt denies issues with eating, states she was eating 3 meals a day at home.  - Offered to order protein supplements for patient and she declines at this time. - Encouraged pt to request supplements if she does not eat as much as she needs to.  Per chart review, pt has lost weight since 2017 but insignificant for time frame. Nutrition-Focused physical exam completed. Findings are mild fat depletion, moderate muscle depletion, and no edema.    Diet Order:  Diet NPO time specified  Skin:  Reviewed, no issues  Last BM:  PTA  Height:   Ht Readings from Last 1 Encounters:  02/11/17 _0  (1.549 m)    Weight:   Wt Readings from Last 1 Encounters:  02/13/17 113 lb 15.7 oz (  51.7 kg)    Ideal Body Weight:  50 kg  BMI:  Body mass index is 21.54 kg/m.  Estimated Nutritional Needs:   Kcal:  3112  Protein:  65-75 grams  Fluid:  1.6L/day  EDUCATION NEEDS:   No education needs identified at this time    Jarome Matin, MS, RD, LDN, CNSC Inpatient Clinical Dietitian Pager # [phone] After hours/weekend pager # [phone]

## 2017-02-13 NOTE — Progress Notes (Signed)
PULMONARY / CRITICAL CARE MEDICINE   Name: Yolanda [name] MRN: [id number] DOB: 1933/10/05    ADMISSION DATE:  02/07/2017 CONSULTATION DATE:  02/10/2017  REFERRING MD:  Carolin Sicks  CHIEF COMPLAINT:  Dyspnea  SUMMARY:  83 y/o F with PMH of ETOH abuse and recent fall (5 wks PTA) with broken coccyx who has been bed bound since admitted 6/22 with increased SOB & dry cough.  CXR 6/24 demonstrated near complete opacification of the left hemithorax. A CT scan of the chest was performed showing a large pleural effusion and airway mucus.  Thoracentesis attempted with 390 ml of hazy yellow fluid > multiple loculations noted in the pleural space.  Deemed not a CVTS candidate and IR placed a Cook catheter > s/p tPA, pulmozyme.     SUBJECTIVE:  RN reports no acute events.  Weaning on PSV 15/5, 30%.  CT with 1300 ml fluid drained.    VITAL SIGNS: BP 128/60 (BP Location: Right Arm)   Pulse 81   Temp 99.8 F (37.7 C) (Oral)   Resp (!) 27   Ht 5\' 1"  (1.549 m)   Wt 113 lb 15.7 oz (51.7 kg)   SpO2 94%   BMI 21.54 kg/m   HEMODYNAMICS:    VENTILATOR SETTINGS: Vent Mode: PRVC FiO2 (%):  [30 %-50 %] 30 % Set Rate:  [14 bmp] 14 bmp Vt Set:  [380 mL] 380 mL PEEP:  [5 cmH20] 5 cmH20 Plateau Pressure:  [18 cmH20-21 cmH20] 18 cmH20  INTAKE / OUTPUT: I/O last 3 completed shifts: In: 1326.3 [I.V.:795.6; NG/GT:480.8; IV Piggyback:50] Out: 2815 [Urine:965; Emesis/NG output:50; Drains:1800]  PHYSICAL EXAMINATION: General:  Thin elderly female in NAD HEENT: MM pink/moist, ETT Neuro: Awakens to voice, follows commands, generalized weakness but moves all ext's  CV: s1s2 rrr, tachy, no m/r/g PULM: even/non-labored, clear on R, significantly improved air movement on L TM:LYYT, non-tender, bsx4 active  Extremities: warm/dry, no edema  Skin: no rashes or lesions  LABS:  BMET  Recent Labs Lab 02/09/17 0541 02/12/17 0254 02/13/17 0324  NA 133* 136 135  K 4.4 4.3 4.0  CL 101 100* 100*  CO2 25 30  28   BUN 16 18 32*  CREATININE 0.54 0.43* 0.47  GLUCOSE 144* 120* 161*    Electrolytes  Recent Labs Lab 02/09/17 0541  02/12/17 0254 02/12/17 1103 02/12/17 1710 02/13/17 0324  CALCIUM 8.9  --  8.9  --   --  8.4*  MG  --   < > 1.9 1.7 2.1 1.8  PHOS  --   --   --  3.2 3.1 3.0  < > = values in this interval not displayed.  CBC  Recent Labs Lab 02/11/17 0303 02/12/17 0254 02/13/17 0324  WBC 29.3* 24.3* 18.0*  HGB 9.9* 9.0* 9.5*  HCT 31.1* 27.8* 28.7*  PLT 474* 433* 505*    Coag's  Recent Labs Lab 02/07/17 1221  INR 1.01    Sepsis Markers  Recent Labs Lab 02/07/17 1205  LATICACIDVEN 0.78    ABG  Recent Labs Lab 02/11/17 1548  PHART 7.395  PCO2ART 53.2*  PO2ART 128*    Liver Enzymes  Recent Labs Lab 02/07/17 1221  AST 27  ALT 34  ALKPHOS 73  BILITOT 0.4  ALBUMIN 2.7*    Cardiac Enzymes  Recent Labs Lab 02/09/17 2321  TROPONINI <0.03    Glucose  Recent Labs Lab 02/12/17 1210 02/12/17 1635 02/12/17 1915 02/12/17 2309 02/13/17 0328 02/13/17 0800  GLUCAP 124* 114* 199* 137* 126* 163*  Imaging Dg Chest Port 1 View  Result Date: 02/13/2017 CLINICAL DATA:  Chest tube. EXAM: PORTABLE CHEST 1 VIEW COMPARISON:  CT 02/12/2017.  Chest x-ray 02/12/2017. FINDINGS: Leads and over the chest in stable position. Endotracheal tube and NG tube in stable position. Heart size stable. Left chest tube noted in good anatomic position. Interim near complete clearing of left pleural effusion with mild residual. No pneumothorax. Left base atelectasis. Heart size stable. Surgical clips right axilla. Scratched IMPRESSION: Left chest tube in good anatomic position. Near complete clearing of left-sided pleural effusion with mild residual. 2. Endotracheal tube and NG tube in stable position. 3.  Persistent left base atelectasis. Electronically Signed   By: Marcello Moores  Register   On: 02/13/2017 06:55   Ct Perc Pleural Drain W/indwell Cath W/img Guide  Result  Date: 02/12/2017 INDICATION: Enlarging loculated left pleural effusion, currently requiring ventilatory support EXAM: RIGHT CHEST TUBE PLACEMENT UNDER CT GUIDANCE MEDICATIONS: The patient is currently admitted to the hospital and receiving intravenous antibiotics. The antibiotics were administered within an appropriate time frame prior to the initiation of the procedure. ANESTHESIA/SEDATION: Intravenous Fentanyl and Versed were administered as conscious sedation during continuous monitoring of the patient's level of consciousness and physiological / cardiorespiratory status by the radiology RN, with a total moderate sedation time of fifteen minutes. PROCEDURE: Informed written consent was obtained from the patient after a thorough discussion of the procedural risks, benefits and alternatives. All questions were addressed. Maximal Sterile Barrier Technique was utilized including caps, mask, sterile gowns, sterile gloves, sterile drape, hand hygiene and skin antiseptic. A timeout was performed prior to the initiation of the procedure. Select axial scans through the thorax obtained. The collection was localized and an appropriate skin entry site was determined and marked. Under CT fluoroscopic guidance, a 19 gauge percutaneous entry needle was advanced into the loculated left pleural collection. An Amplatz guidewire advanced towards the apex, confirmed on CT. Tract dilated to facilitate placement of a 14 French pigtail catheter, formed within the dependent aspect of the collection. CT confirms appropriate positioning. The catheter was secured externally with 0 Prolene suture and StatLock and placed to Pleur-Evac suction. A sample of the aspirate sent for Gram stain and culture. The patient tolerated the procedure well. COMPLICATIONS: None immediate. IMPRESSION: 1. Technically successful left pigtail chest tube placement under CT guidance. A sample of the aspirate were sent for Gram stain and culture. Electronically  Signed   By: Lucrezia Europe M.D.   On: 02/12/2017 11:06     STUDIES:  CT chest 06/25 >> large left-sided pleural effusion with likely left lower lobe airway occlusion. Pleural Fluid 6/25 >> glucose <20, WBC 11,561, lymph 8, turbid/yellow Pleural Cytology 6/25 >> acute inflammation, no malignant cells identified   CULTURES: BCx2 6/22 >>  Pleural Culture 6/25 >> negative   ANTIBIOTICS: Azithro 6/22 >>  Ceftriaxone 6/22 >> 6/26 Cefepime 6/26 >>   SIGNIFICANT EVENTS: 6/22  Admit 6/25  PCCM consulted 6/26  Tx to ICU, NRB, increased WOB.  CVTS consulted 6/27  IR placement of small bore chest tube.  TPA, pulmozyme  6/28  CXR cleared   LINES/TUBES: ETT 6/26 >>   DISCUSSION: 83 year old female admitted with bronchitis and failure to thrive.  She developed a rather acute onset of a large pleural effusion.  S/P Thora on 6/25 with noted multiple loculations on [place] assessment with concern for empyema     ASSESSMENT / PLAN:  PULMONARY A: Acute Hypoxic Respiratory Failure  Acute bronchitis Large Left Pleural  Effusion - loculated on [place] assessment per IR, concern for empyema  P:   PRVC 8 cc/kg  SBT daily, hopeful for extubation 6/28 Appreciate IR assistance for chest tube placement  Not a candidate for CVTS intervention  D/c tPA, pulmozyme as effusions cleared  Continue abx, D7/x Intermittent CXR  CARDIOVASCULAR A: HTN - suspect pain / agitation related, family reports normal at baseline P: Tele monitoring  PRN hydralazine for SBP > 170  HEME A: Anemia - suspect of chronic disease / ETOH use  P: Monitor CBC Heparin for DVT prophylaxis   GI: A: Protein Calorie Malnutrition P: Continue TF per Nutrition  NPO  Pepcid for SUP  Colace BID PRN  PRN zofran  NEURO A: ETOH Abuse  Anxiety  Chronic Pain  P: RASS Goal: 0 Continue precedex gtt  Monitor for withdrawal symptoms  PRN fentanyl + versed  Continue prozac  Thiamine + folate    CC Time:  30 minutes    Noe Gens, NP-C [facility] Pgr: [phone] or if no answer [phone] 02/13/2017, 8:21 AM

## 2017-02-13 NOTE — Progress Notes (Signed)
Pt. Transported uneventfully to/from CT, RT to monitor.

## 2017-02-13 NOTE — Progress Notes (Signed)
Referring Physician(s): [place]  Supervising Physician: Corrie Mckusick  Patient Status:  [facility] - In-pt  Chief Complaint:  Complex left pleural effusion  Subjective: Intubated; failed SBT; no acute distress; left chest drain with approximately 2 L output since placement yesterday   Allergies: Patient has no known allergies.  Medications: Prior to Admission medications   Medication Sig Start Date End Date Taking? Authorizing Provider  FLUoxetine (PROZAC) 20 MG capsule Take 20 mg by mouth daily.   Yes [provider]  naloxegol oxalate (MOVANTIK) 25 MG TABS tablet Take by mouth daily.   Yes [provider]  Oxycodone HCl 10 MG TABS Take 1 tablet by mouth every 6 (six) hours as needed. 11/15/15  Yes [provider]  traZODone (DESYREL) 50 MG tablet Take 50 mg by mouth at bedtime. 02/05/17  Yes [provider]     Vital Signs: BP 113/63   Pulse 77   Temp 99.7 F (37.6 C) (Axillary)   Resp (!) 28   Ht 5\' 1"  (1.549 m)   Wt 113 lb 15.7 oz (51.7 kg)   SpO2 95%   BMI 21.54 kg/m   Physical Exam intubated, left chest drain intact, no obvious air leak noted; total output approximately 2 L yellow to currently blood tinged fluid  Imaging: Dg Chest 1 View  Result Date: 02/10/2017 CLINICAL DATA:  Left thoracentesis. EXAM: CHEST 1 VIEW COMPARISON:  02/09/2017 FINDINGS: Slightly improved aeration in left lung compared to the previous examination. There continues to be a large amount of pleural-parenchymal disease in the left chest. There is concern for new airspace densities in the right upper lung compared to the recent comparison examination. Patient is rotated towards the right. Evidence for a spinal stimulator. Again noted are surgical clips in the bilateral axillary regions. Severe degenerative changes in the left glenohumeral joint. IMPRESSION: Slightly improved aeration in the left lung following the left thoracentesis. Negative for a large  pneumothorax. There continues to be a large amount of pleural-based disease in the left chest. Concern for increased airspace disease in the right upper lung. Electronically Signed   By: Markus Daft M.D.   On: 02/10/2017 13:45   Ct Chest Wo Contrast  Result Date: 02/10/2017 CLINICAL DATA:  Left-sided chest pain with shortness of breath and hypoxia EXAM: CT CHEST WITHOUT CONTRAST TECHNIQUE: Multidetector CT imaging of the chest was performed following the standard protocol without IV contrast. COMPARISON:  Chest x-ray 02/09/2017, 02/07/2017 FINDINGS: Cardiovascular: Limited evaluation without intravenous contrast. Aortic atherosclerosis. No aneurysmal dilatation. Coronary artery calcification. Mild cardiomegaly. Small pericardial effusion at the cardiac apex. Mediastinum/Nodes: Esophagus within normal limits. Mild mediastinal nodes, largest seen in the precarinal space measuring up to 13 mm. No thyroid mass. Midline trachea. Hypodense material within left lower lobe bronchus. Lungs/Pleura: Large left-sided pleural effusion with diffuse atelectasis of left lung and air bronchograms. Mild shift of mediastinal contents to the right. Moderate emphysema in the right upper lobe. Small right pleural effusion Upper Abdomen: Diffuse nodular enlargement of the left adrenal gland. 13 mm low-density nodule in the right adrenal gland consistent with adenoma. Musculoskeletal: Advanced degenerative changes of the left shoulder. Mild compression deformities of T11 and T12. Thoracic stimulator device posterior to T8. Post mastectomy changes IMPRESSION: 1. Large left pleural effusion with diffuse atelectasis of the left lung. Unable to exclude mass given diffuse consolidation. Hypodense material within the left lower lobe bronchus, could reflect mucus plugging. 2. Mild mediastinal adenopathy, possibly reactive. 3. Cardiomegaly with small pericardial effusion  4. Moderate emphysema Aortic Atherosclerosis (ICD10-I70.0) and Emphysema  (ICD10-J43.9). Electronically Signed   By: Donavan Foil M.D.   On: 02/10/2017 03:11   Dg Chest Port 1 View  Result Date: 02/13/2017 CLINICAL DATA:  Chest tube. EXAM: PORTABLE CHEST 1 VIEW COMPARISON:  CT 02/12/2017.  Chest x-ray 02/12/2017. FINDINGS: Leads and over the chest in stable position. Endotracheal tube and NG tube in stable position. Heart size stable. Left chest tube noted in good anatomic position. Interim near complete clearing of left pleural effusion with mild residual. No pneumothorax. Left base atelectasis. Heart size stable. Surgical clips right axilla. Scratched IMPRESSION: Left chest tube in good anatomic position. Near complete clearing of left-sided pleural effusion with mild residual. 2. Endotracheal tube and NG tube in stable position. 3.  Persistent left base atelectasis. Electronically Signed   By: Marcello Moores  Register   On: 02/13/2017 06:55   Dg Chest Port 1 View  Result Date: 02/12/2017 CLINICAL DATA:  Respiratory failure. EXAM: PORTABLE CHEST 1 VIEW COMPARISON:  02/11/2017.  02/10/2017. FINDINGS: Endotracheal tube 2.4 cm above the carina. NG tube tip below left hemidiaphragm. Overlying leads are in stable position. Heart size stable. Persistent bilateral pulmonary airspace opacities and left-sided prominent pleural effusion again noted without interim change. No pneumothorax . Surgical clips right axilla . IMPRESSION: 1. Endotracheal tube tip 2.4 cm above the carina on today's exam. NG tube tip noted below left hemidiaphragm. 2. Persistent bilateral pulmonary airspace passes and left-sided prominent pleural effusion again noted without interim change . Electronically Signed   By: Marcello Moores  Register   On: 02/12/2017 06:46   Portable Chest Xray  Result Date: 02/11/2017 CLINICAL DATA:  Status post endotracheal tube placement. Respiratory distress. EXAM: PORTABLE CHEST 1 VIEW COMPARISON:  Chest x-ray of February 11, 2017 at 5:12 a.m. FINDINGS: The endotracheal tube tip lies 5.2 cm above  the carina. There remains considerable pleural pleural fluid on the left with some residual aerated left lung. The retrocardiac region on the left remains dense. There is no mediastinal shift. There is confluent interstitial infiltrate in the right upper lobe. The esophagogastric tube tip and proximal port project below the inferior margin of the image. IMPRESSION: The endotracheal tube tip lies 5.2 cm above the carina. The esophagogastric tube tip and proximal port project below the inferior margin of the image. The infiltrates bilaterally and the left pleural effusion are stable. These results were called by telephone at the time of interpretation on 02/11/2017 at 1:00 pm to Page, RN,, who verbally acknowledged these results. Electronically Signed   By: David  Martinique M.D.   On: 02/11/2017 13:03   Dg Chest Port 1 View  Result Date: 02/11/2017 CLINICAL DATA:  Dyspnea EXAM: PORTABLE CHEST 1 VIEW COMPARISON:  02/10/2017 FINDINGS: Large left pleural collection remains unchanged. Diffuse airspace opacities persist in both lungs. No pneumothorax. IMPRESSION: Diffuse bilateral airspace opacities and prominent left-sided pleural collection, without significant interval change. Electronically Signed   By: Andreas Newport M.D.   On: 02/11/2017 06:07   Dg Chest Port 1 View  Result Date: 02/09/2017 CLINICAL DATA:  Left lateral chest pain for 2 days. EXAM: PORTABLE CHEST 1 VIEW COMPARISON:  February 07, 2017 FINDINGS: The left hemithorax is now completely opacified consistent with a large effusion and associated atelectasis. Suggested mild edema on the right. No other acute abnormalities. IMPRESSION: Complete opacification of the left hemithorax, new in the interval. Mild edema seen in the right lung. Electronically Signed   By: Dorise Bullion III M.D  On: 02/09/2017 14:16   Dg Abd Portable 1v  Result Date: 02/11/2017 CLINICAL DATA:  Orogastric tube placement. EXAM: PORTABLE ABDOMEN - 1 VIEW COMPARISON:  Chest x-ray  of today's date FINDINGS: The esophagogastric tube tip and proximal port project in distal and mid portions of the gastric body respectively. The stomach contains a moderate amount of gas. IMPRESSION: The esophagogastric tube tip in proximal port lie in the gastric body. Electronically Signed   By: David  [place] M.D.   On: 02/11/2017 13:04   Ct Perc Pleural Drain W/indwell Cath W/img Guide  Result Date: 02/12/2017 INDICATION: Enlarging loculated left pleural effusion, currently requiring ventilatory support EXAM: RIGHT CHEST TUBE PLACEMENT UNDER CT GUIDANCE MEDICATIONS: The patient is currently admitted to the hospital and receiving intravenous antibiotics. The antibiotics were administered within an appropriate time frame prior to the initiation of the procedure. ANESTHESIA/SEDATION: Intravenous Fentanyl and Versed were administered as conscious sedation during continuous monitoring of the patient's level of consciousness and physiological / cardiorespiratory status by the radiology RN, with a total moderate sedation time of fifteen minutes. PROCEDURE: Informed written consent was obtained from the patient after a thorough discussion of the procedural risks, benefits and alternatives. All questions were addressed. Maximal Sterile Barrier Technique was utilized including caps, mask, sterile gowns, sterile gloves, sterile drape, hand hygiene and skin antiseptic. A timeout was performed prior to the initiation of the procedure. Select axial scans through the thorax obtained. The collection was localized and an appropriate skin entry site was determined and marked. Under CT fluoroscopic guidance, a 19 gauge percutaneous entry needle was advanced into the loculated left pleural collection. An Amplatz guidewire advanced towards the apex, confirmed on CT. Tract dilated to facilitate placement of a 14 French pigtail catheter, formed within the dependent aspect of the collection. CT confirms appropriate positioning. The  catheter was secured externally with 0 Prolene suture and StatLock and placed to Pleur-Evac suction. A sample of the aspirate sent for Gram stain and culture. The patient tolerated the procedure well. COMPLICATIONS: None immediate. IMPRESSION: 1. Technically successful left pigtail chest tube placement under CT guidance. A sample of the aspirate were sent for Gram stain and culture. Electronically Signed   By: Lucrezia Europe M.D.   On: 02/12/2017 11:06   US Thoracentesis Asp Pleural Space W/img Guide  Result Date: 02/10/2017 INDICATION: Dyspnea, recent fall with coccygeal fracture, alcohol abuse, leukocytosis, complex left pleural effusion; request made for diagnostic and therapeutic left thoracentesis EXAM: ULTRASOUND GUIDED DIAGNOSTIC AND THERAPEUTIC LEFT THORACENTESIS MEDICATIONS: None. COMPLICATIONS: None immediate. PROCEDURE: An ultrasound guided thoracentesis was thoroughly discussed with the patient's son and questions answered. The benefits, risks, alternatives and complications were also discussed. The patient's son understands and wishes to proceed with the procedure. Written consent was obtained. Ultrasound was performed to localize and mark an adequate pocket of fluid in the left chest. The area was then prepped and draped in the normal sterile fashion. 1% Lidocaine was used for local anesthesia. Under ultrasound guidance a Yueh catheter was introduced. Thoracentesis was performed. The catheter was removed and a dressing applied. FINDINGS: A total of approximately 390 cc of hazy, yellow fluid was removed. Samples were sent to the laboratory as requested by the clinical team. IMPRESSION: Successful ultrasound guided diagnostic and therapeutic left thoracentesis yielding 390 cc of pleural fluid. Due to complexity/significant multiloculations of the left pleural space only the above amount of fluid could be removed at this time. Recommend TCTS consultation . Read by: Rowe Robert, PA-C Electronically Signed  By: Corrie Mckusick D.O.   On: 02/10/2017 14:04    Labs:  CBC:  Recent Labs  02/10/17 0513 02/11/17 0303 02/12/17 0254 02/13/17 0324  WBC 34.8* 29.3* 24.3* 18.0*  HGB 9.9* 9.9* 9.0* 9.5*  HCT 31.2* 31.1* 27.8* 28.7*  PLT 471* 474* 433* 505*    COAGS:  Recent Labs  02/07/17 1221  INR 1.01    BMP:  Recent Labs  02/08/17 0527 02/09/17 0541 02/12/17 0254 02/13/17 0324  NA 133* 133* 136 135  K 4.4 4.4 4.3 4.0  CL 95* 101 100* 100*  CO2 28 25 30 28   GLUCOSE 149* 144* 120* 161*  BUN 24* 16 18 32*  CALCIUM 9.0 8.9 8.9 8.4*  CREATININE 0.70 0.54 0.43* 0.47  GFRNONAA >60 >60 >60 >60  GFRAA >60 >60 >60 >60    LIVER FUNCTION TESTS:  Recent Labs  02/07/17 1221  BILITOT 0.4  AST 27  ALT 34  ALKPHOS 73  PROT 6.5  ALBUMIN 2.7*    Assessment and Plan: Patient with history of enlarging loculated left pleural effusion, intubated secondary to respiratory distress, status post left chest drain placement 6/27 with significant output; temp 99.7; WBC 18, previously 24.3, hemoglobin stable, creatinine normal, blood /pleural fluid cultures negative to date, chest x-ray today shows good positioning of tube and near complete clearing of pleural effusion, no pneumothorax; patient has received TPA and Pulmozyme via tube since placement-CT chest ordered today by critical care for further evaluation; continue current treatment, await final culture results   Electronically Signed: D. Rowe Robert, PA-C 02/13/2017, 1:15 PM   I spent a total of 15 minutes at the the patient's bedside AND on the patient's hospital floor or unit, greater than 50% of which was counseling/coordinating care for left chest drain    Patient ID: Yolanda [name], female   DOB: May 31, 1934, 83 y.o.   MRN: [id number]

## 2017-02-14 ENCOUNTER — Inpatient Hospital Stay (HOSPITAL_COMMUNITY): Payer: 59

## 2017-02-14 LAB — GLUCOSE, CAPILLARY
GLUCOSE-CAPILLARY: 132 mg/dL — AB (ref 65–99)
GLUCOSE-CAPILLARY: 133 mg/dL — AB (ref 65–99)
GLUCOSE-CAPILLARY: 121 mg/dL — AB (ref 65–99)
GLUCOSE-CAPILLARY: 118 mg/dL — AB (ref 65–99)
Glucose-Capillary: 142 mg/dL — ABNORMAL HIGH (ref 65–99)

## 2017-02-14 LAB — CBC
HCT: 27.6 % — ABNORMAL LOW (ref 36.0–46.0)
HEMOGLOBIN: 9 g/dL — AB (ref 12.0–15.0)
MCH: 30.7 pg (ref 26.0–34.0)
MCHC: 32.6 g/dL (ref 30.0–36.0)
MCV: 94.2 fL (ref 78.0–100.0)
Platelets: 479 10*3/uL — ABNORMAL HIGH (ref 150–400)
RBC: 2.93 MIL/uL — AB (ref 3.87–5.11)
RDW: 13.8 % (ref 11.5–15.5)
WBC: 17.1 10*3/uL — AB (ref 4.0–10.5)

## 2017-02-14 LAB — BASIC METABOLIC PANEL
ANION GAP: 6 (ref 5–15)
BUN: 36 mg/dL — ABNORMAL HIGH (ref 6–20)
CALCIUM: 8.1 mg/dL — AB (ref 8.9–10.3)
CO2: 28 mmol/L (ref 22–32)
Chloride: 101 mmol/L (ref 101–111)
Creatinine, Ser: 0.38 mg/dL — ABNORMAL LOW (ref 0.44–1.00)
GFR calc non Af Amer: >60 mL/min (ref >60)
Glucose, Bld: 141 mg/dL — ABNORMAL HIGH (ref 65–99)
Potassium: 3.5 mmol/L (ref 3.5–5.1)
SODIUM: 135 mmol/L (ref 135–145)

## 2017-02-14 LAB — BODY FLUID CULTURE: Culture: NO GROWTH

## 2017-02-14 MED ORDER — FUROSEMIDE 10 MG/ML IJ SOLN
40.0000 mg | Freq: Once | INTRAMUSCULAR | Status: AC
  Administered 2017-02-14: 40 mg via INTRAVENOUS
  Filled 2017-02-14: qty 4

## 2017-02-14 MED ORDER — LIP MEDEX EX OINT
1.0000 "application " | TOPICAL_OINTMENT | CUTANEOUS | Status: DC | PRN
  Filled 2017-02-14: qty 7

## 2017-02-14 MED ORDER — MAGNESIUM SULFATE 2 GM/50ML IV SOLN
2.0000 g | Freq: Once | INTRAVENOUS | Status: AC
  Administered 2017-02-14: 2 g via INTRAVENOUS
  Filled 2017-02-14: qty 50

## 2017-02-14 MED ORDER — POTASSIUM CHLORIDE 20 MEQ/15ML (10%) PO SOLN
40.0000 meq | Freq: Once | ORAL | Status: AC
  Administered 2017-02-14: 40 meq
  Filled 2017-02-14: qty 30

## 2017-02-14 NOTE — Progress Notes (Signed)
Pharmacy Antibiotic Note  Yolanda [name] is a 83 y.o. female admitted on 02/07/2017 with c/o cough and subsequently started to azithromycin and ceftriaxone for acute bronchitis. Chest CT on 6/25 showed large left pleural effusion --> s/p thoracentesis on 6/25.  To change ceftriaxone to cefepime on 6/26 for suspected empyema.  02/14/2017:   Day#8 antibiotics  Afebrile  Leukocytosis improving  Renal fxn stable  Cx data negative to date, Chest CT improving  Plan: - Continue Cefepime 2gm IV q24h - Continue azithromycin 500 mg IV q24h per MD - Monitor renal function, cx data and clinical status - Duration of therapy per MD _____________________________  Height: 5\' 1"  (154.9 cm) Weight: 117 lb 4.6 oz (53.2 kg) IBW/kg (Calculated) : 47.8  Temp (24hrs), Avg:99.1 F (37.3 C), Min:97.9 F (36.6 C), Max:100.1 F (37.8 C)   Recent Labs Lab 02/07/17 1205  02/08/17 0527 02/09/17 0541 02/10/17 0513 02/11/17 0303 02/12/17 0254 02/13/17 0324 02/14/17 0309  WBC  --   < > 38.6* 36.8* 34.8* 29.3* 24.3* 18.0* 17.1*  CREATININE  --   < > 0.70 0.54  --   --  0.43* 0.47 0.38*  LATICACIDVEN 0.78  --   --   --   --   --   --   --   --   < > = values in this interval not displayed.  Estimated Creatinine Clearance: 42.3 mL/min (A) (by C-G formula based on SCr of 0.38 mg/dL (L)).    No Known Allergies  Antimicrobials this admission:  6/22 CTX>> 6/26 6/22 azithro>> 6/26 cefepime>>  Dose adjustments this admission:  --  Microbiology results:  6/22 BCx x2: NG-F 6/25 pleural fluid: NGTD 6/26 MRSA PCR: neg 6/27 pleural fluid: NGTD  Thank you for allowing pharmacy to be a part of this patient's care.  Biagio Borg 02/14/2017 7:57 AM

## 2017-02-14 NOTE — Progress Notes (Signed)
CSW following to assist with d/c planning. Pt is from [facility]. She is presently requiring vent support. Prior to vent placement pt had requested SNF placement at [facility] and was accepted pending [employer] authorization. CSW will continue to follow to assist with d/c planning as needed.  Werner Lean LCSW [phone]

## 2017-02-14 NOTE — Progress Notes (Signed)
PULMONARY / CRITICAL CARE MEDICINE   Name: Yolanda [name] MRN: [id number] DOB: 17-Apr-1934    ADMISSION DATE:  02/07/2017 CONSULTATION DATE:  02/10/2017  REFERRING MD:  Carolin Sicks  CHIEF COMPLAINT:  Dyspnea  SUMMARY:  83 y/o F with PMH of ETOH abuse and recent fall (5 wks PTA) with broken coccyx who has been bed bound since admitted 6/22 with increased SOB & dry cough.  CXR 6/24 demonstrated near complete opacification of the left hemithorax. A CT scan of the chest was performed showing a large pleural effusion and airway mucus.  Thoracentesis attempted with 390 ml of hazy yellow fluid > multiple loculations noted in the pleural space.  Deemed not a CVTS candidate and IR placed a Cook catheter > s/p tPA, pulmozyme.     SUBJECTIVE:   No acute events overnight.  Pt weaning on PSV 8/5, precedex to 0.5.  Alert / tolerating wean.  ~700 ml CT drainage since yesterday  VITAL SIGNS: BP (!) 125/49 (BP Location: Right Arm)   Pulse 84   Temp 97.4 F (36.3 C) (Axillary)   Resp (!) 22   Ht 5\' 1"  (1.549 m)   Wt 117 lb 4.6 oz (53.2 kg)   SpO2 94%   BMI 22.16 kg/m   HEMODYNAMICS:    VENTILATOR SETTINGS: Vent Mode: PSV FiO2 (%):  [30 %] 30 % Set Rate:  [14 bmp] 14 bmp Vt Set:  [380 mL] 380 mL PEEP:  [5 cmH20] 5 cmH20 Pressure Support:  [8 cmH20] 8 cmH20 Plateau Pressure:  [17 cmH20-18 cmH20] 18 cmH20  INTAKE / OUTPUT: I/O last 3 completed shifts: In: 3572.6 [P.O.:60; I.V.:804; NG/GT:1658.6; IV Piggyback:1050] Out: 20 [Urine:910; Drains:2100]  PHYSICAL EXAMINATION: General:  Frail elderly female in NAD on vent  HEENT: MM pink/moist, ETT Neuro: Awake, alert, follows commands  CV: s1s2 rrr, no m/r/g PULM: even/non-labored, clear anterior, diminished lower lateral, left chest tube c/d/i, no air leak, bloody drainage in chamber UY:QIHK, non-tender, bsx4 active  Extremities: warm/dry, no edema  Skin: no rashes or lesions  LABS:  BMET  Recent Labs Lab 02/12/17 0254 02/13/17 0324  02/14/17 0309  NA 136 135 135  K 4.3 4.0 3.5  CL 100* 100* 101  CO2 30 28 28   BUN 18 32* 36*  CREATININE 0.43* 0.47 0.38*  GLUCOSE 120* 161* 141*    Electrolytes  Recent Labs Lab 02/12/17 0254  02/12/17 1710 02/13/17 0324 02/13/17 1726 02/14/17 0309  CALCIUM 8.9  --   --  8.4*  --  8.1*  MG 1.9  < > 2.1 1.8 1.7  --   PHOS  --   < > 3.1 3.0 2.2*  --   < > = values in this interval not displayed.  CBC  Recent Labs Lab 02/12/17 0254 02/13/17 0324 02/14/17 0309  WBC 24.3* 18.0* 17.1*  HGB 9.0* 9.5* 9.0*  HCT 27.8* 28.7* 27.6*  PLT 433* 505* 479*    Coag's  Recent Labs Lab 02/07/17 1221  INR 1.01    Sepsis Markers  Recent Labs Lab 02/07/17 1205  LATICACIDVEN 0.78    ABG  Recent Labs Lab 02/11/17 1548  PHART 7.395  PCO2ART 53.2*  PO2ART 128*    Liver Enzymes  Recent Labs Lab 02/07/17 1221  AST 27  ALT 34  ALKPHOS 73  BILITOT 0.4  ALBUMIN 2.7*    Cardiac Enzymes  Recent Labs Lab 02/09/17 2321  TROPONINI <0.03    Glucose  Recent Labs Lab 02/13/17 1311 02/13/17 1641 02/13/17 1944  02/13/17 2330 02/14/17 0318 02/14/17 0749  GLUCAP 184* 150* 156* 135* 132* 133*    Imaging Ct Chest Wo Contrast  Result Date: 02/14/2017 CLINICAL DATA:  Pleural effusion, status post pigtail drainage EXAM: CT CHEST WITHOUT CONTRAST TECHNIQUE: Multidetector CT imaging of the chest was performed following the standard protocol without IV contrast. COMPARISON:  Radiograph 02/13/2017, CT 02/10/2017 FINDINGS: Cardiovascular: Limited evaluation without intravenous contrast. Ectatic ascending aorta and arch with calcifications. Coronary artery calcification. Cardiomegaly. Small pericardial effusion. Mediastinum/Nodes: Stable mediastinal lymph nodes, precarinal lymph node measures 12 mm compared with 13 mm previously. Limited evaluation for hilar nodes without contrast. Small hypodense thyroid nodules. Endotracheal tube tip terminates in the distal trachea,  about 2 cm superior to the carina. Esophageal tube looped in the stomach. Lungs/Pleura: Moderate emphysema. Interim placement of left-sided pigtail drainage catheter with tip positioned in the posterior pleural space. Significant decrease in left-sided pleural effusion. Small to moderate residual pleural effusion is noted. Increased right pleural effusion, small to moderate. Improved aeration of the left upper and lower lobes. Residual compressive atelectasis at the left base. Upper Abdomen: No acute abnormality. Stable hypodense nodule right adrenal gland probably an adenoma. Musculoskeletal: Thoracic stimulator at T8. Chronic compression of T11 and T12. Post mastectomy changes. IMPRESSION: 1. Interim placement of left-sided pigtail drainage catheter with significant decrease in left-sided pleural effusion. Small to moderate residual pleural effusion is noted. There is improved aeration of the left upper and lower lobes with clearing of previously noted material in the left bronchi. Passive atelectasis within the left lower lobe 2. Increased right pleural effusion now small to moderate 3. Moderate emphysema 4. Stable mediastinal adenopathy 5. Cardiomegaly with small amount of pericardial effusion Aortic Atherosclerosis (ICD10-I70.0) and Emphysema (ICD10-J43.9). Electronically Signed   By: Donavan Foil M.D.   On: 02/14/2017 00:54   Dg Chest Port 1 View  Result Date: 02/14/2017 CLINICAL DATA:  Respiratory failure. EXAM: PORTABLE CHEST 1 VIEW COMPARISON:  CT 02/13/2017.  Chest x-ray 02/13/2017 . FINDINGS: Stable lead positioning the over the chest. Endotracheal tube and NG tube in stable position. Left chest tube in stable position. No pneumothorax. Mild left pleural thickening, no significant effusion. Mild basilar atelectasis . Stable mild interstitial prominence . Stable cardiomegaly. Surgical clips again noted over the chest. IMPRESSION: 1. Lines and tubes including left chest tube in stable position. No  significant left pleural effusion. No pneumothorax. 2.  Mild basilar atelectasis. 3. Stable cardiomegaly and mild interstitial prominence Electronically Signed   By: [place]   On: 02/14/2017 06:18     STUDIES:  CT chest 06/25 >> large left-sided pleural effusion with likely left lower lobe airway occlusion. Pleural Fluid 6/25 >> glucose <20, WBC 11,561, lymph 8, turbid/yellow Pleural Cytology 6/25 >> acute inflammation, no malignant cells identified  CT Chest 6/28 >> left sided pigtail catheter with decrease in left side effusion, small to moderate residual pleural effusion noted, improved aeration in the LUL, LLL, passive atelectasis in the LLL, increased R pleural effusion (small to moderate), stable mediastinal adenopathy, cardiomegaly with small amt pericardial effusion   CULTURES: BCx2 6/22 >>  Pleural Culture 6/25 >> negative   ANTIBIOTICS: Azithro 6/22 >>  Ceftriaxone 6/22 >> 6/26 Cefepime 6/26 >>    SIGNIFICANT EVENTS: 6/22  Admit 6/25  PCCM consulted 6/26  Tx to ICU, NRB, increased WOB.  CVTS consulted 6/27  IR placement of small bore chest tube.  TPA, pulmozyme  6/28  CXR cleared    LINES/TUBES: ETT 6/26 >>  Left Pigtail CT 6/27 >>   DISCUSSION: 83 year old female admitted with bronchitis and failure to thrive.  She developed a rather acute onset of a large pleural effusion.  S/P Thora on 6/25 with noted multiple loculations on [place] assessment with concern for empyema.   ASSESSMENT / PLAN:  PULMONARY A: Acute Hypoxic Respiratory Failure  Acute bronchitis Large Left Pleural Effusion - loculated on [place] assessment per IR, concern for empyema.  Not a candidate for CVTS intervention per Dr. Cyndia Bent.   Small to Moderate R Pleural Effusion P:   PRVC as rest mode SBT > hopeful for extubation  IR following for CT  Continue abx, D8/x  Intermittent CXR CT care per protocol  Lasix 40 mg IV x1  CARDIOVASCULAR A: HTN - suspect pain / agitation related, family  reports normal at baseline P: ICU monitoring  PRN hydralazine for SBP >170 Consider ECHO   HEME A: Anemia - suspect of chronic disease / ETOH use  P: Trend CBC  Heparin for DVT prophylaxis   GI: A: Protein Calorie Malnutrition P: TF per Nutrition  NPO  Pepcid for SUP  Colace PRN  PRN zofran   NEURO A: ETOH Abuse  Anxiety  Chronic Pain  P: RASS Goal: 0 Continue precedex gtt Monitor for withdrawal symptoms  PRN fentanyl + versed  Continue prozac  Thiamine + folate   CC Time:   30 minutes  Noe Gens, NP-C [facility] Pgr: [phone] or if no answer [phone] 02/14/2017, 8:29 AM

## 2017-02-14 NOTE — Progress Notes (Signed)
PT Cancellation Note  Patient Details Name: Yolanda [name] MRN: [id number] DOB: 1934-05-21   Cancelled Treatment:     PT deferred this date.  Pt continues on vent.  Will dc PT services at this time.  Please reorder when pt condition improves to allow participation with rehab.     Lakai Moree 02/14/2017, 6:38 AM

## 2017-02-14 NOTE — Progress Notes (Signed)
Referring Physician(s): McQuaid,D  Supervising Physician: Corrie Mckusick  Patient Status:  [facility] - In-pt  Chief Complaint:  Complex left pleural effusion  Subjective: Patient now extubated, breathing improved since chest tube placed with good output   Allergies: Patient has no known allergies.  Medications: Prior to Admission medications   Medication Sig Start Date End Date Taking? Authorizing Provider  FLUoxetine (PROZAC) 20 MG capsule Take 20 mg by mouth daily.   Yes [provider]  naloxegol oxalate (MOVANTIK) 25 MG TABS tablet Take by mouth daily.   Yes [provider]  Oxycodone HCl 10 MG TABS Take 1 tablet by mouth every 6 (six) hours as needed. 11/15/15  Yes [provider]  traZODone (DESYREL) 50 MG tablet Take 50 mg by mouth at bedtime. 02/05/17  Yes [provider]     Vital Signs: BP (!) 103/43   Pulse 84   Temp 97.4 F (36.3 C) (Axillary)   Resp 19   Ht 5\' 1"  (1.549 m)   Wt 117 lb 4.6 oz (53.2 kg)   SpO2 96%   BMI 22.16 kg/m   Physical Exam left chest drain intact, no obvious air leak, output 800 mL blood-tinged fluid, cultures negative to date  Imaging: Ct Chest Wo Contrast  Result Date: 02/14/2017 CLINICAL DATA:  Pleural effusion, status post pigtail drainage EXAM: CT CHEST WITHOUT CONTRAST TECHNIQUE: Multidetector CT imaging of the chest was performed following the standard protocol without IV contrast. COMPARISON:  Radiograph 02/13/2017, CT 02/10/2017 FINDINGS: Cardiovascular: Limited evaluation without intravenous contrast. Ectatic ascending aorta and arch with calcifications. Coronary artery calcification. Cardiomegaly. Small pericardial effusion. Mediastinum/Nodes: Stable mediastinal lymph nodes, precarinal lymph node measures 12 mm compared with 13 mm previously. Limited evaluation for hilar nodes without contrast. Small hypodense thyroid nodules. Endotracheal tube tip terminates in the distal trachea, about 2 cm  superior to the carina. Esophageal tube looped in the stomach. Lungs/Pleura: Moderate emphysema. Interim placement of left-sided pigtail drainage catheter with tip positioned in the posterior pleural space. Significant decrease in left-sided pleural effusion. Small to moderate residual pleural effusion is noted. Increased right pleural effusion, small to moderate. Improved aeration of the left upper and lower lobes. Residual compressive atelectasis at the left base. Upper Abdomen: No acute abnormality. Stable hypodense nodule right adrenal gland probably an adenoma. Musculoskeletal: Thoracic stimulator at T8. Chronic compression of T11 and T12. Post mastectomy changes. IMPRESSION: 1. Interim placement of left-sided pigtail drainage catheter with significant decrease in left-sided pleural effusion. Small to moderate residual pleural effusion is noted. There is improved aeration of the left upper and lower lobes with clearing of previously noted material in the left bronchi. Passive atelectasis within the left lower lobe 2. Increased right pleural effusion now small to moderate 3. Moderate emphysema 4. Stable mediastinal adenopathy 5. Cardiomegaly with small amount of pericardial effusion Aortic Atherosclerosis (ICD10-I70.0) and Emphysema (ICD10-J43.9). Electronically Signed   By: Donavan Foil M.D.   On: 02/14/2017 00:54   Dg Chest Port 1 View  Result Date: 02/14/2017 CLINICAL DATA:  Respiratory failure. EXAM: PORTABLE CHEST 1 VIEW COMPARISON:  CT 02/13/2017.  Chest x-ray 02/13/2017 . FINDINGS: Stable lead positioning the over the chest. Endotracheal tube and NG tube in stable position. Left chest tube in stable position. No pneumothorax. Mild left pleural thickening, no significant effusion. Mild basilar atelectasis . Stable mild interstitial prominence . Stable cardiomegaly. Surgical clips again noted over the chest. IMPRESSION: 1. Lines and tubes including left chest tube in stable position. No  significant  left pleural effusion. No pneumothorax. 2.  Mild basilar atelectasis. 3. Stable cardiomegaly and mild interstitial prominence Electronically Signed   By: [place]   On: 02/14/2017 06:18   Dg Chest Port 1 View  Result Date: 02/13/2017 CLINICAL DATA:  Chest tube. EXAM: PORTABLE CHEST 1 VIEW COMPARISON:  CT 02/12/2017.  Chest x-ray 02/12/2017. FINDINGS: Leads and over the chest in stable position. Endotracheal tube and NG tube in stable position. Heart size stable. Left chest tube noted in good anatomic position. Interim near complete clearing of left pleural effusion with mild residual. No pneumothorax. Left base atelectasis. Heart size stable. Surgical clips right axilla. Scratched IMPRESSION: Left chest tube in good anatomic position. Near complete clearing of left-sided pleural effusion with mild residual. 2. Endotracheal tube and NG tube in stable position. 3.  Persistent left base atelectasis. Electronically Signed   By: Marcello Moores  Register   On: 02/13/2017 06:55   Dg Chest Port 1 View  Result Date: 02/12/2017 CLINICAL DATA:  Respiratory failure. EXAM: PORTABLE CHEST 1 VIEW COMPARISON:  02/11/2017.  02/10/2017. FINDINGS: Endotracheal tube 2.4 cm above the carina. NG tube tip below left hemidiaphragm. Overlying leads are in stable position. Heart size stable. Persistent bilateral pulmonary airspace opacities and left-sided prominent pleural effusion again noted without interim change. No pneumothorax . Surgical clips right axilla . IMPRESSION: 1. Endotracheal tube tip 2.4 cm above the carina on today's exam. NG tube tip noted below left hemidiaphragm. 2. Persistent bilateral pulmonary airspace passes and left-sided prominent pleural effusion again noted without interim change . Electronically Signed   By: Marcello Moores  Register   On: 02/12/2017 06:46   Portable Chest Xray  Result Date: 02/11/2017 CLINICAL DATA:  Status post endotracheal tube placement. Respiratory distress. EXAM: PORTABLE CHEST 1 VIEW  COMPARISON:  Chest x-ray of February 11, 2017 at 5:12 a.m. FINDINGS: The endotracheal tube tip lies 5.2 cm above the carina. There remains considerable pleural pleural fluid on the left with some residual aerated left lung. The retrocardiac region on the left remains dense. There is no mediastinal shift. There is confluent interstitial infiltrate in the right upper lobe. The esophagogastric tube tip and proximal port project below the inferior margin of the image. IMPRESSION: The endotracheal tube tip lies 5.2 cm above the carina. The esophagogastric tube tip and proximal port project below the inferior margin of the image. The infiltrates bilaterally and the left pleural effusion are stable. These results were called by telephone at the time of interpretation on 02/11/2017 at 1:00 pm to Page, RN,, who verbally acknowledged these results. Electronically Signed   By: David  [place] M.D.   On: 02/11/2017 13:03   Dg Chest Port 1 View  Result Date: 02/11/2017 CLINICAL DATA:  Dyspnea EXAM: PORTABLE CHEST 1 VIEW COMPARISON:  02/10/2017 FINDINGS: Large left pleural collection remains unchanged. Diffuse airspace opacities persist in both lungs. No pneumothorax. IMPRESSION: Diffuse bilateral airspace opacities and prominent left-sided pleural collection, without significant interval change. Electronically Signed   By: Andreas Newport M.D.   On: 02/11/2017 06:07   Dg Abd Portable 1v  Result Date: 02/11/2017 CLINICAL DATA:  Orogastric tube placement. EXAM: PORTABLE ABDOMEN - 1 VIEW COMPARISON:  Chest x-ray of today's date FINDINGS: The esophagogastric tube tip and proximal port project in distal and mid portions of the gastric body respectively. The stomach contains a moderate amount of gas. IMPRESSION: The esophagogastric tube tip in proximal port lie in the gastric body. Electronically Signed   By: David  Martinique M.D.  On: 02/11/2017 13:04   Ct Perc Pleural Drain W/indwell Cath W/img Guide  Result Date:  02/12/2017 INDICATION: Enlarging loculated left pleural effusion, currently requiring ventilatory support EXAM: RIGHT CHEST TUBE PLACEMENT UNDER CT GUIDANCE MEDICATIONS: The patient is currently admitted to the hospital and receiving intravenous antibiotics. The antibiotics were administered within an appropriate time frame prior to the initiation of the procedure. ANESTHESIA/SEDATION: Intravenous Fentanyl and Versed were administered as conscious sedation during continuous monitoring of the patient's level of consciousness and physiological / cardiorespiratory status by the radiology RN, with a total moderate sedation time of fifteen minutes. PROCEDURE: Informed written consent was obtained from the patient after a thorough discussion of the procedural risks, benefits and alternatives. All questions were addressed. Maximal Sterile Barrier Technique was utilized including caps, mask, sterile gowns, sterile gloves, sterile drape, hand hygiene and skin antiseptic. A timeout was performed prior to the initiation of the procedure. Select axial scans through the thorax obtained. The collection was localized and an appropriate skin entry site was determined and marked. Under CT fluoroscopic guidance, a 19 gauge percutaneous entry needle was advanced into the loculated left pleural collection. An Amplatz guidewire advanced towards the apex, confirmed on CT. Tract dilated to facilitate placement of a 14 French pigtail catheter, formed within the dependent aspect of the collection. CT confirms appropriate positioning. The catheter was secured externally with 0 Prolene suture and StatLock and placed to Pleur-Evac suction. A sample of the aspirate sent for Gram stain and culture. The patient tolerated the procedure well. COMPLICATIONS: None immediate. IMPRESSION: 1. Technically successful left pigtail chest tube placement under CT guidance. A sample of the aspirate were sent for Gram stain and culture. Electronically Signed    By: Lucrezia Europe M.D.   On: 02/12/2017 11:06    Labs:  CBC:  Recent Labs  02/11/17 0303 02/12/17 0254 02/13/17 0324 02/14/17 0309  WBC 29.3* 24.3* 18.0* 17.1*  HGB 9.9* 9.0* 9.5* 9.0*  HCT 31.1* 27.8* 28.7* 27.6*  PLT 474* 433* 505* 479*    COAGS:  Recent Labs  02/07/17 1221  INR 1.01    BMP:  Recent Labs  02/09/17 0541 02/12/17 0254 02/13/17 0324 02/14/17 0309  NA 133* 136 135 135  K 4.4 4.3 4.0 3.5  CL 101 100* 100* 101  CO2 25 30 28 28   GLUCOSE 144* 120* 161* 141*  BUN 16 18 32* 36*  CALCIUM 8.9 8.9 8.4* 8.1*  CREATININE 0.54 0.43* 0.47 0.38*  GFRNONAA >60 >60 >60 >60  GFRAA >60 >60 >60 >60    LIVER FUNCTION TESTS:  Recent Labs  02/07/17 1221  BILITOT 0.4  AST 27  ALT 34  ALKPHOS 73  PROT 6.5  ALBUMIN 2.7*    Assessment and Plan: Patient with history of enlarging loculated left pleural effusion, intubated secondary to respiratory distress, status post left chest drain placement 6/27 with significant output; extubated today; WBC 17.1, fluid cultures negative to date; follow-up CT chest shows significant decrease in left effusion, no pneumothorax, small-to-moderate right effusion. Continue drain for now as long as output is moderate. Monitor chest x-ray.   Electronically Signed: D. Rowe Robert, PA-C 02/14/2017, 3:51 PM   I spent a total of  15 minutes at the the patient's bedside AND on the patient's hospital floor or unit, greater than 50% of which was counseling/coordinating care for left chest drain    Patient ID: Yolanda [name], female   DOB: 1934-04-29, 83 y.o.   MRN: [id number]

## 2017-02-14 NOTE — Progress Notes (Signed)
Nutrition Follow-up  DOCUMENTATION CODES:   Non-severe (moderate) malnutrition in context of chronic illness  INTERVENTION:  - Continue Vital AF 1.2 @ 45 mL/hr.  - PEPuP protocol.   NUTRITION DIAGNOSIS:   Malnutrition (moderate) related to chronic illness (ETOH abuse) as evidenced by mild depletion of body fat, moderate depletions of muscle mass. -ongoing  GOAL:   Patient will meet greater than or equal to 90% of their needs -met with current TF regimen.   MONITOR:   Vent status, TF tolerance, Weight trends, Labs, I & O's  ASSESSMENT:   83 year old female with history of prior stroke, alcohol abuse and drinks about 6 glasses of wine everyday, fall sustaining coccygeal fracture about 5 weeks ago brought in by patient's son further evaluation of failure to thrive, cough. In the ER patient was found to have hypoxia in room air with leukocytosis. Chest x-ray with no acute finding. Treated with antibiotics and admitted for further evaluation.  6/29 Pt remains intubated with OGT in place. Weight continues to trend up and is +1.5 kg from yesterday and is now +5.1 kg from admission. Kcal need today is consistent with yesterday. Pt currently receiving Vital AF 1.2 @ 45 mL/hr which is providing 1296 kcal, 81 grams of protein (108% maximum estimated protein need), and 876 mL free water.   PCCM NP note this AM states ~700 mL chest tube drainage since 6/28. Also states hopeful for extubation, consideration of ECHO. Will maintain current TF regimen but consider changing to a formula with less free water if pt unable to be extubated over the weekend.  Patient is currently intubated on ventilator support MV: 9.6 L/min Temp (24hrs), Avg:98.8 F (37.1 C), Min:97.4 F (36.3 C), Max:100.1 F (37.8 C) BP: 125/49 and MAP: 76  Medications reviewed; 20 mg IV Pepcid BID, 1 mg folic acid/day, 40 mg IV Lasix x1 dose today, 2 g IV Mg sulfate x1 run today, 15 mL liquid multivitamin per OGT/day, 1 packet  Miralax/day, 40 mEq KCl per OGT x1 dose today.  Labs reviewed; CBGs: 132 and 133 mg/dL today, BUN: 36 mg/dL, creatinine: 0.38 mg/dL, Ca: 8.1 mg/dL, Phos: 2.2 mg/dL.  Drip: Precedex @ 0.5 mcg/kg/hr.   6/28 - Estimated kcal need updated this AM and based on admission weight (48.1 kg). - New weight obtained this AM is is +3.6 kg from admission weight.  - Pt receiving Vital AF 1.2 @ 45 mL/hr which provides 1296 kcal, 81 grams of protein (108% maximum estimated protein need), and 876 mL free water.  - Will continue this order; labs this AM show no signs of refeeding with TF at goal rate.  Patient is currently intubated on ventilator support MV: 10 L/min Temp (24hrs), Avg:99.1 F (37.3 C), Min:98.2 F (36.8 C), Max:99.8 F (37.7 C) BP: 147/58 and MAP: 88 Drip: Precedex @ 1 mcg/kg/hr.   6/27 - Last recorded intake was on 6/25: 50% of dinner meal.  - Given that her intake has been poor since admission, would recommend monitor for refeeding syndrome.  - Pt was intubated on 6/26 d/t acute respiratory failure with hypoxemia.  - Pt now s/p left chest tube placement.  Patient is currently intubated on ventilator support MV: 9L/min Temp (24hrs), Avg:98.5 F (36.9 C), Min:97.4 F (36.3 C), Max:99.8 F (37.7 C)    Diet Order:  Diet NPO time specified  Skin:  Reviewed, no issues  Last BM:  PTA  Height:   Ht Readings from Last 1 Encounters:  02/11/17 '5\' 1"'$  (1.549 m)  Weight:   Wt Readings from Last 1 Encounters:  02/14/17 117 lb 4.6 oz (53.2 kg)    Ideal Body Weight:  50 kg  BMI:  Body mass index is 22.16 kg/m.  Estimated Nutritional Needs:   Kcal:  0459  Protein:  65-75 grams  Fluid:  1.6L/day  EDUCATION NEEDS:   No education needs identified at this time    Jarome Matin, MS, RD, LDN, CNSC Inpatient Clinical Dietitian Pager # [phone] After hours/weekend pager # [phone]

## 2017-02-14 NOTE — Progress Notes (Signed)
Pt extubated to 4L nasal cannula.

## 2017-02-15 ENCOUNTER — Inpatient Hospital Stay (HOSPITAL_COMMUNITY): Payer: 59

## 2017-02-15 LAB — GLUCOSE, CAPILLARY
GLUCOSE-CAPILLARY: 85 mg/dL (ref 65–99)
GLUCOSE-CAPILLARY: 95 mg/dL (ref 65–99)
Glucose-Capillary: 112 mg/dL — ABNORMAL HIGH (ref 65–99)
Glucose-Capillary: 86 mg/dL (ref 65–99)
Glucose-Capillary: 94 mg/dL (ref 65–99)
Glucose-Capillary: 97 mg/dL (ref 65–99)
Glucose-Capillary: 99 mg/dL (ref 65–99)

## 2017-02-15 LAB — BASIC METABOLIC PANEL
Anion gap: 6 (ref 5–15)
BUN: 27 mg/dL — AB (ref 6–20)
CHLORIDE: 98 mmol/L — AB (ref 101–111)
CO2: 31 mmol/L (ref 22–32)
CREATININE: 0.31 mg/dL — AB (ref 0.44–1.00)
Calcium: 8.5 mg/dL — ABNORMAL LOW (ref 8.9–10.3)
GFR calc Af Amer: >60 mL/min (ref >60)
GFR calc non Af Amer: >60 mL/min (ref >60)
Glucose, Bld: 109 mg/dL — ABNORMAL HIGH (ref 65–99)
POTASSIUM: 3.5 mmol/L (ref 3.5–5.1)
SODIUM: 135 mmol/L (ref 135–145)

## 2017-02-15 LAB — CBC
HEMATOCRIT: 26.7 % — AB (ref 36.0–46.0)
HEMOGLOBIN: 8.8 g/dL — AB (ref 12.0–15.0)
MCH: 31.7 pg (ref 26.0–34.0)
MCHC: 33 g/dL (ref 30.0–36.0)
MCV: 96 fL (ref 78.0–100.0)
Platelets: 590 10*3/uL — ABNORMAL HIGH (ref 150–400)
RBC: 2.78 MIL/uL — ABNORMAL LOW (ref 3.87–5.11)
RDW: 13.8 % (ref 11.5–15.5)
WBC: 13 10*3/uL — ABNORMAL HIGH (ref 4.0–10.5)

## 2017-02-15 LAB — PROCALCITONIN: Procalcitonin: 0.25 ng/mL

## 2017-02-15 LAB — MAGNESIUM: MAGNESIUM: 2 mg/dL (ref 1.7–2.4)

## 2017-02-15 MED ORDER — DOCUSATE SODIUM 50 MG/5ML PO LIQD
100.0000 mg | Freq: Two times a day (BID) | ORAL | Status: DC | PRN
  Filled 2017-02-15: qty 10

## 2017-02-15 MED ORDER — ADULT MULTIVITAMIN W/MINERALS CH
1.0000 | ORAL_TABLET | Freq: Every day | ORAL | Status: DC
  Administered 2017-02-15 – 2017-02-20 (×6): 1 via ORAL
  Filled 2017-02-15 (×6): qty 1

## 2017-02-15 MED ORDER — GUAIFENESIN 100 MG/5ML PO SOLN
20.0000 mL | Freq: Two times a day (BID) | ORAL | Status: DC
  Administered 2017-02-16 – 2017-02-20 (×6): 400 mg via ORAL
  Filled 2017-02-15 (×2): qty 20
  Filled 2017-02-15: qty 10
  Filled 2017-02-15: qty 20
  Filled 2017-02-15: qty 10
  Filled 2017-02-15: qty 20
  Filled 2017-02-15: qty 10
  Filled 2017-02-15: qty 20
  Filled 2017-02-15: qty 40

## 2017-02-15 MED ORDER — FUROSEMIDE 10 MG/ML IJ SOLN
40.0000 mg | Freq: Once | INTRAMUSCULAR | Status: AC
  Administered 2017-02-15: 40 mg via INTRAVENOUS
  Filled 2017-02-15: qty 4

## 2017-02-15 NOTE — Progress Notes (Signed)
PULMONARY / CRITICAL CARE MEDICINE   Name: Yolanda [name] MRN: [id number] DOB: [birth year]    ADMISSION DATE:  02/07/2017 CONSULTATION DATE:  02/10/2017  REFERRING MD:  Carolin Sicks  CHIEF COMPLAINT:  Dyspnea  SUMMARY:  83 y/o F with PMH of ETOH abuse and recent fall (5 wks PTA) with broken coccyx who has been bed bound since admitted 6/22 with increased SOB & dry cough.  CXR 6/24 demonstrated near complete opacification of the left hemithorax. A CT scan of the chest was performed showing a large pleural effusion and airway mucus.  Thoracentesis attempted with 390 ml of hazy yellow fluid > multiple loculations noted in the pleural space.  Deemed not a CVTS candidate and IR placed a Cook catheter > s/p tPA, pulmozyme.     SUBJECTIVE:   Stable after extubation yesterday CT output is improving. ~100cc today Mental status US improving but she is still on predecex drip.  VITAL SIGNS: BP (!) 128/54   Pulse 84   Temp 98.4 F (36.9 C) (Oral)   Resp 17   Ht 5\' 1"  (1.549 m)   Wt 118 lb 6.2 oz (53.7 kg)   SpO2 96%   BMI 22.37 kg/m   HEMODYNAMICS:    VENTILATOR SETTINGS:    INTAKE / OUTPUT: I/O last 3 completed shifts: In: 1546.4 [P.O.:60; I.V.:539.9; NG/GT:946.5] Out: 3025 [Urine:1925; Drains:1100]  PHYSICAL EXAMINATION: Gen:      No acute distress HEENT:  EOMI, sclera anicteric Neck:     No masses; no thyromegaly Lungs:    Clear to auscultation bilaterally; normal respiratory effort CV:         Regular rate and rhythm; no murmurs Abd:      + bowel sounds; soft, non-tender; no palpable masses, no distension Ext:    No edema; adequate peripheral perfusion Skin:      Warm and dry; no rash Neuro: alert and oriented x 3 Psych: normal mood and affect   LABS:  BMET  Recent Labs Lab 02/13/17 0324 02/14/17 0309 02/15/17 0400  NA 135 135 135  K 4.0 3.5 3.5  CL 100* 101 98*  CO2 28 28 31   BUN 32* 36* 27*  CREATININE 0.47 0.38* 0.31*  GLUCOSE 161* 141* 109*     Electrolytes  Recent Labs Lab 02/12/17 1710 02/13/17 0324 02/13/17 1726 02/14/17 0309 02/15/17 0400  CALCIUM  --  8.4*  --  8.1* 8.5*  MG 2.1 1.8 1.7  --  2.0  PHOS 3.1 3.0 2.2*  --   --     CBC  Recent Labs Lab 02/13/17 0324 02/14/17 0309 02/15/17 0400  WBC 18.0* 17.1* 13.0*  HGB 9.5* 9.0* 8.8*  HCT 28.7* 27.6* 26.7*  PLT 505* 479* 590*    Coag's No results for input(s): APTT, INR in the last 168 hours.  Sepsis Markers No results for input(s): LATICACIDVEN, PROCALCITON, O2SATVEN in the last 168 hours.  ABG  Recent Labs Lab 02/11/17 1548  PHART 7.395  PCO2ART 53.2*  PO2ART 128*    Liver Enzymes No results for input(s): AST, ALT, ALKPHOS, BILITOT, ALBUMIN in the last 168 hours.  Cardiac Enzymes  Recent Labs Lab 02/09/17 2321  TROPONINI <0.03    Glucose  Recent Labs Lab 02/14/17 1127 02/14/17 1607 02/14/17 1936 02/15/17 0008 02/15/17 0319 02/15/17 0734  GLUCAP 142* 121* 118* 94 99 85    Imaging Dg Chest Port 1 View  Result Date: 02/15/2017 CLINICAL DATA:  Respiratory failure.  Extubated. EXAM: PORTABLE CHEST 1 VIEW COMPARISON:  02/14/2017 FINDINGS: Endotracheal tube and nasogastric tube have been removed. Left chest tube remains in place. Persistent volume loss in the left lower lobe. Small amount of pleural air loculated at the left base, unchanged. IMPRESSION: Endotracheal tube and nasogastric tube removal. Persistent left chest tube. Persistent left lower lobe collapse. Persistent small amount of pleural air at the left base. Electronically Signed   By: Nelson Chimes M.D.   On: 02/15/2017 07:20     STUDIES:  CT chest 06/25 >> large left-sided pleural effusion with likely left lower lobe airway occlusion. Pleural Fluid 6/25 >> glucose <20, WBC 11,561, lymph 8, turbid/yellow Pleural Cytology 6/25 >> acute inflammation, no malignant cells identified  CT Chest 6/28 >> left sided pigtail catheter with decrease in left side effusion, small  to moderate residual pleural effusion noted, improved aeration in the LUL, LLL, passive atelectasis in the LLL, increased R pleural effusion (small to moderate), stable mediastinal adenopathy, cardiomegaly with small amt pericardial effusion   CULTURES: BCx2 6/22 >>  Pleural Culture 6/25 >> negative   ANTIBIOTICS: Azithro 6/22 >>  Ceftriaxone 6/22 >> 6/26 Cefepime 6/26 >>   SIGNIFICANT EVENTS: 6/22  Admit 6/25  PCCM consulted 6/26  Tx to ICU, NRB, increased WOB.  CVTS consulted 6/27  IR placement of small bore chest tube.  TPA, pulmozyme  6/28  CXR cleared    LINES/TUBES: ETT 6/26 >>  Left Pigtail CT 6/27 >>   DISCUSSION: 83 year old female admitted with bronchitis and failure to thrive.  She developed a rather acute onset of a large pleural effusion.  S/P Thora on 6/25 with noted multiple loculations on [place] assessment with concern for empyema.   ASSESSMENT / PLAN:  PULMONARY A: Acute Hypoxic Respiratory Failure  Acute bronchitis Large Left Pleural Effusion - loculated on [place] assessment per IR, concern for empyema.  Not a candidate for CVTS intervention per Dr. Cyndia Bent.   Small to Moderate R Pleural Effusion P:   Wean down O2 as tolerated Continue CT drainage for now. Can probably D/C once output has improved Continue antibiotics Follow cultures, Pct Lasix IV 40 mg repeat today  CARDIOVASCULAR A: HTN - suspect pain / agitation related, family reports normal at baseline P: ICU monitoring  PRN hydralazine   HEME A: Anemia - suspect of chronic disease / ETOH use  P: Follow CBC  GI: A: Protein Calorie Malnutrition P: Cleared for diet PO feeds  NEURO A: ETOH Abuse  Anxiety  Chronic Pain  P: Wean off precedex drip.  The patient is critically ill with multiple organ system failure and requires high complexity decision making for assessment and support, frequent evaluation and titration of therapies, advanced monitoring, review of radiographic studies and  interpretation of complex data.   Critical Care Time devoted to patient care services, exclusive of separately billable procedures, described in this note is 35 minutes.   Marshell Garfinkel MD [facility] Pager [phone] If no answer or after 3pm call: [phone] 02/15/2017, 11:06 AM

## 2017-02-15 NOTE — Progress Notes (Signed)
Pt repeatedly asks for pain medication despite recent administration. Does not remember that she has had it. Answered orientation questions appropriately but is still confused about when she had pain medications. Educated patient repeatedly on when her next dose is and that RN would bring it in when it was due, pending pain rating.  Will continue to monitor.

## 2017-02-15 NOTE — Evaluation (Signed)
Clinical/Bedside Swallow Evaluation Patient Details  Name: Yolanda [name] MRN: [id number] Date of Birth: [birth year]  Today's Date: 02/15/2017 Time: SLP Start Time (ACUTE ONLY): 1937 SLP Stop Time (ACUTE ONLY): 0850 SLP Time Calculation (min) (ACUTE ONLY): 15 min  Past Medical History:  Past Medical History:  Diagnosis Date  . Adenomatous colon polyp    tubular  . Anxiety disorder    panic attacks, PMH of. Dr  Janna Arch  . 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 Diverticulosis   . Fibroids    Lupron shots  . High altitude sickness 2008  . Premature menopause    due to Lupron   Past Surgical History:  Past Surgical History:  Procedure Laterality Date  . BREAST LUMPECTOMY  1984   radiation , R breast  . CESAREAN SECTION     X 2  . colonoscopy with polypectomy  2011   Dr  Delfin Edis  . LAMINECTOMY     X3 L-Sspine  . MASTECTOMY  1990   R breast  . MASTECTOMY  1996   L   HPI:  Pt is an 83 y.o. female with PMH of remote CVA and alcohol abuse, drinks 5-6 glasses of wine per day. Patient brought to the hospital 6/22 because of failure to thrive. Her story started about 5 weeks ago when she fell and bruised her tailbone, since then she was unable to ambulate very well, reported spending most of the time in the bed, had less appetite and oral intake. For the past several days PTA she started to have some cough which is minimally productive, she denied fever and palpitation. Chest CT 6/25 showed large L pleural effusion with diffuse atelectasis of L lung, possible mucus plugging. Pt was intubated 6/26 to 6/29. CXR 6/30 showed persistent LLL collapse. Bedside swallow eval ordered.    Assessment / Plan / Recommendation Clinical Impression  Pt showed no overt s/s of aspiration during PO trials at bedside. Vocal quality is clear and pt was able to produce a strong cough. Pt was alert and able to answer simple questions/ follow 1-step directions  during evaluation. Aspiration risk is still increased due to current respiratory status and post-3 day intubation. Recommend initiating regular diet/ thin liquids, meds whole with liquid, providing supervision to assist with feeding as needed, cue pt to take small bites/ sips. Will f/u x1 for diet tolerance. SLP Visit Diagnosis: Dysphagia, unspecified (R13.10)    Aspiration Risk  Mild aspiration risk    Diet Recommendation Regular;Thin liquid   Liquid Administration via: Cup;Straw Medication Administration: Whole meds with liquid Supervision: Staff to assist with self feeding Compensations: Slow rate;Small sips/bites Postural Changes: Seated upright at 90 degrees    Other  Recommendations Oral Care Recommendations: Oral care BID   Follow up Recommendations Other (comment) (TBD)      Frequency and Duration min 1 x/week  1 week       Prognosis        Swallow Study   General HPI: Pt is an 83 y.o. female with PMH of remote CVA and alcohol abuse, drinks 5-6 glasses of wine per day. Patient brought to the hospital 6/22 because of failure to thrive. Her story started about 5 weeks ago when she fell and bruised her tailbone, since then she was unable to ambulate very well, reported spending most of the time in the bed, had less appetite and oral intake. For the past several days PTA she started  to have some cough which is minimally productive, she denied fever and palpitation. Chest CT 6/25 showed large L pleural effusion with diffuse atelectasis of L lung, possible mucus plugging. Pt was intubated 6/26 to 6/29. CXR 6/30 showed persistent LLL collapse. Bedside swallow eval ordered.  Type of Study: Bedside Swallow Evaluation Previous Swallow Assessment: none in chart Diet Prior to this Study: NPO Temperature Spikes Noted: No Respiratory Status: Nasal cannula History of Recent Intubation: Yes Length of Intubations (days): 3 days Date extubated: 02/14/17 Behavior/Cognition:  Alert;Cooperative Oral Cavity Assessment: Within Functional Limits Oral Cavity - Dentition: Adequate natural dentition Vision: Functional for self-feeding Self-Feeding Abilities: Able to feed self;Needs assist Patient Positioning: Upright in bed Baseline Vocal Quality: Low vocal intensity Volitional Cough: Strong    Oral/Motor/Sensory Function Overall Oral Motor/Sensory Function: Within functional limits   Ice Chips Ice chips: Not tested   Thin Liquid Thin Liquid: Within functional limits    Nectar Thick Nectar Thick Liquid: Not tested   Honey Thick Honey Thick Liquid: Not tested   Puree Puree: Within functional limits Presentation: Spoon   Solid   GO   Solid: Within functional limits        Yolanda [name], [place], CCC-SLP 02/15/2017,8:57 AM [phone]

## 2017-02-15 NOTE — Progress Notes (Signed)
Patient ID: Yolanda [name], female   DOB: 08-14-1934, 83 y.o.   MRN: [id number]    Referring Physician(s): Dr. Blossom Hoops  Supervising Physician: Markus Daft  Patient Status: [facility] - In-pt  Chief Complaint: Left pleural effusion  Subjective: Patient with no current complaints.  Allergies: Patient has no known allergies.  Medications: Prior to Admission medications   Medication Sig Start Date End Date Taking? Authorizing Provider  FLUoxetine (PROZAC) 20 MG capsule Take 20 mg by mouth daily.   Yes [provider]  naloxegol oxalate (MOVANTIK) 25 MG TABS tablet Take by mouth daily.   Yes [provider]  Oxycodone HCl 10 MG TABS Take 1 tablet by mouth every 6 (six) hours as needed. 11/15/15  Yes [provider]  traZODone (DESYREL) 50 MG tablet Take 50 mg by mouth at bedtime. 02/05/17  Yes [provider]    Vital Signs: BP (!) 141/54   Pulse 84   Temp 98.3 F (36.8 C) (Oral)   Resp 17   Ht 5\' 1"  (1.549 m)   Wt 118 lb 6.2 oz (53.7 kg)   SpO2 96%   BMI 22.37 kg/m   Physical Exam: Chest: Chest tube in place with about 100cc of serosang fluid daily in drain.  Site is c/d/i.  No air leak  Imaging: Ct Chest Wo Contrast  Result Date: 02/14/2017 CLINICAL DATA:  Pleural effusion, status post pigtail drainage EXAM: CT CHEST WITHOUT CONTRAST TECHNIQUE: Multidetector CT imaging of the chest was performed following the standard protocol without IV contrast. COMPARISON:  Radiograph 02/13/2017, CT 02/10/2017 FINDINGS: Cardiovascular: Limited evaluation without intravenous contrast. Ectatic ascending aorta and arch with calcifications. Coronary artery calcification. Cardiomegaly. Small pericardial effusion. Mediastinum/Nodes: Stable mediastinal lymph nodes, precarinal lymph node measures 12 mm compared with 13 mm previously. Limited evaluation for hilar nodes without contrast. Small hypodense thyroid nodules. Endotracheal tube tip terminates in the distal  trachea, about 2 cm superior to the carina. Esophageal tube looped in the stomach. Lungs/Pleura: Moderate emphysema. Interim placement of left-sided pigtail drainage catheter with tip positioned in the posterior pleural space. Significant decrease in left-sided pleural effusion. Small to moderate residual pleural effusion is noted. Increased right pleural effusion, small to moderate. Improved aeration of the left upper and lower lobes. Residual compressive atelectasis at the left base. Upper Abdomen: No acute abnormality. Stable hypodense nodule right adrenal gland probably an adenoma. Musculoskeletal: Thoracic stimulator at T8. Chronic compression of T11 and T12. Post mastectomy changes. IMPRESSION: 1. Interim placement of left-sided pigtail drainage catheter with significant decrease in left-sided pleural effusion. Small to moderate residual pleural effusion is noted. There is improved aeration of the left upper and lower lobes with clearing of previously noted material in the left bronchi. Passive atelectasis within the left lower lobe 2. Increased right pleural effusion now small to moderate 3. Moderate emphysema 4. Stable mediastinal adenopathy 5. Cardiomegaly with small amount of pericardial effusion Aortic Atherosclerosis (ICD10-I70.0) and Emphysema (ICD10-J43.9). Electronically Signed   By: Donavan Foil M.D.   On: 02/14/2017 00:54   Dg Chest Port 1 View  Result Date: 02/15/2017 CLINICAL DATA:  Respiratory failure.  Extubated. EXAM: PORTABLE CHEST 1 VIEW COMPARISON:  02/14/2017 FINDINGS: Endotracheal tube and nasogastric tube have been removed. Left chest tube remains in place. Persistent volume loss in the left lower lobe. Small amount of pleural air loculated at the left base, unchanged. IMPRESSION: Endotracheal tube and nasogastric tube removal. Persistent left chest tube. Persistent left lower lobe collapse. Persistent small amount of  pleural air at the left base. Electronically Signed   By: Nelson Chimes M.D.   On: 02/15/2017 07:20   Dg Chest Port 1 View  Result Date: 02/14/2017 CLINICAL DATA:  Respiratory failure. EXAM: PORTABLE CHEST 1 VIEW COMPARISON:  CT 02/13/2017.  Chest x-ray 02/13/2017 . FINDINGS: Stable lead positioning the over the chest. Endotracheal tube and NG tube in stable position. Left chest tube in stable position. No pneumothorax. Mild left pleural thickening, no significant effusion. Mild basilar atelectasis . Stable mild interstitial prominence . Stable cardiomegaly. Surgical clips again noted over the chest. IMPRESSION: 1. Lines and tubes including left chest tube in stable position. No significant left pleural effusion. No pneumothorax. 2.  Mild basilar atelectasis. 3. Stable cardiomegaly and mild interstitial prominence Electronically Signed   By: [place]   On: 02/14/2017 06:18   Dg Chest Port 1 View  Result Date: 02/13/2017 CLINICAL DATA:  Chest tube. EXAM: PORTABLE CHEST 1 VIEW COMPARISON:  CT 02/12/2017.  Chest x-ray 02/12/2017. FINDINGS: Leads and over the chest in stable position. Endotracheal tube and NG tube in stable position. Heart size stable. Left chest tube noted in good anatomic position. Interim near complete clearing of left pleural effusion with mild residual. No pneumothorax. Left base atelectasis. Heart size stable. Surgical clips right axilla. Scratched IMPRESSION: Left chest tube in good anatomic position. Near complete clearing of left-sided pleural effusion with mild residual. 2. Endotracheal tube and NG tube in stable position. 3.  Persistent left base atelectasis. Electronically Signed   By: Marcello Moores  Register   On: 02/13/2017 06:55   Dg Chest Port 1 View  Result Date: 02/12/2017 CLINICAL DATA:  Respiratory failure. EXAM: PORTABLE CHEST 1 VIEW COMPARISON:  02/11/2017.  02/10/2017. FINDINGS: Endotracheal tube 2.4 cm above the carina. NG tube tip below left hemidiaphragm. Overlying leads are in stable position. Heart size stable. Persistent  bilateral pulmonary airspace opacities and left-sided prominent pleural effusion again noted without interim change. No pneumothorax . Surgical clips right axilla . IMPRESSION: 1. Endotracheal tube tip 2.4 cm above the carina on today's exam. NG tube tip noted below left hemidiaphragm. 2. Persistent bilateral pulmonary airspace passes and left-sided prominent pleural effusion again noted without interim change . Electronically Signed   By: Marcello Moores  Register   On: 02/12/2017 06:46   Portable Chest Xray  Result Date: 02/11/2017 CLINICAL DATA:  Status post endotracheal tube placement. Respiratory distress. EXAM: PORTABLE CHEST 1 VIEW COMPARISON:  Chest x-ray of February 11, 2017 at 5:12 a.m. FINDINGS: The endotracheal tube tip lies 5.2 cm above the carina. There remains considerable pleural pleural fluid on the left with some residual aerated left lung. The retrocardiac region on the left remains dense. There is no mediastinal shift. There is confluent interstitial infiltrate in the right upper lobe. The esophagogastric tube tip and proximal port project below the inferior margin of the image. IMPRESSION: The endotracheal tube tip lies 5.2 cm above the carina. The esophagogastric tube tip and proximal port project below the inferior margin of the image. The infiltrates bilaterally and the left pleural effusion are stable. These results were called by telephone at the time of interpretation on 02/11/2017 at 1:00 pm to Page, RN,, who verbally acknowledged these results. Electronically Signed   By: David  [place]ique M.D.   On: 02/11/2017 13:03   Dg Abd Portable 1v  Result Date: 02/11/2017 CLINICAL DATA:  Orogastric tube placement. EXAM: PORTABLE ABDOMEN - 1 VIEW COMPARISON:  Chest x-ray of today's date FINDINGS: The esophagogastric tube  tip and proximal port project in distal and mid portions of the gastric body respectively. The stomach contains a moderate amount of gas. IMPRESSION: The esophagogastric tube tip in proximal  port lie in the gastric body. Electronically Signed   By: David  Martinique M.D.   On: 02/11/2017 13:04   Ct Perc Pleural Drain W/indwell Cath W/img Guide  Result Date: 02/12/2017 INDICATION: Enlarging loculated left pleural effusion, currently requiring ventilatory support EXAM: RIGHT CHEST TUBE PLACEMENT UNDER CT GUIDANCE MEDICATIONS: The patient is currently admitted to the hospital and receiving intravenous antibiotics. The antibiotics were administered within an appropriate time frame prior to the initiation of the procedure. ANESTHESIA/SEDATION: Intravenous Fentanyl and Versed were administered as conscious sedation during continuous monitoring of the patient's level of consciousness and physiological / cardiorespiratory status by the radiology RN, with a total moderate sedation time of fifteen minutes. PROCEDURE: Informed written consent was obtained from the patient after a thorough discussion of the procedural risks, benefits and alternatives. All questions were addressed. Maximal Sterile Barrier Technique was utilized including caps, mask, sterile gowns, sterile gloves, sterile drape, hand hygiene and skin antiseptic. A timeout was performed prior to the initiation of the procedure. Select axial scans through the thorax obtained. The collection was localized and an appropriate skin entry site was determined and marked. Under CT fluoroscopic guidance, a 19 gauge percutaneous entry needle was advanced into the loculated left pleural collection. An Amplatz guidewire advanced towards the apex, confirmed on CT. Tract dilated to facilitate placement of a 14 French pigtail catheter, formed within the dependent aspect of the collection. CT confirms appropriate positioning. The catheter was secured externally with 0 Prolene suture and StatLock and placed to Pleur-Evac suction. A sample of the aspirate sent for Gram stain and culture. The patient tolerated the procedure well. COMPLICATIONS: None immediate.  IMPRESSION: 1. Technically successful left pigtail chest tube placement under CT guidance. A sample of the aspirate were sent for Gram stain and culture. Electronically Signed   By: Lucrezia Europe M.D.   On: 02/12/2017 11:06    Labs:  CBC:  Recent Labs  02/12/17 0254 02/13/17 0324 02/14/17 0309 02/15/17 0400  WBC 24.3* 18.0* 17.1* 13.0*  HGB 9.0* 9.5* 9.0* 8.8*  HCT 27.8* 28.7* 27.6* 26.7*  PLT 433* 505* 479* 590*    COAGS:  Recent Labs  02/07/17 1221  INR 1.01    BMP:  Recent Labs  02/12/17 0254 02/13/17 0324 02/14/17 0309 02/15/17 0400  NA 136 135 135 135  K 4.3 4.0 3.5 3.5  CL 100* 100* 101 98*  CO2 30 28 28 31   GLUCOSE 120* 161* 141* 109*  BUN 18 32* 36* 27*  CALCIUM 8.9 8.4* 8.1* 8.5*  CREATININE 0.43* 0.47 0.38* 0.31*  GFRNONAA >60 >60 >60 >60  GFRAA >60 >60 >60 >60    LIVER FUNCTION TESTS:  Recent Labs  02/07/17 1221  BILITOT 0.4  AST 27  ALT 34  ALKPHOS 73  PROT 6.5  ALBUMIN 2.7*    Assessment and Plan: 1. S/p Chest tube placement for left pleural effusion  Still with about 100cc of output a day. CCM recommends leaving for now until output decreases.  Will follow.  Electronically Signed: Henreitta Cea 02/15/2017, 12:39 PM   I spent a total of 15 Minutes at the the patient's bedside AND on the patient's hospital floor or unit, greater than 50% of which was counseling/coordinating care for left pleural effusion

## 2017-02-16 ENCOUNTER — Inpatient Hospital Stay (HOSPITAL_COMMUNITY): Payer: 59

## 2017-02-16 LAB — BASIC METABOLIC PANEL
Anion gap: 10 (ref 5–15)
BUN: 24 mg/dL — AB (ref 6–20)
CHLORIDE: 95 mmol/L — AB (ref 101–111)
CO2: 30 mmol/L (ref 22–32)
CREATININE: 0.38 mg/dL — AB (ref 0.44–1.00)
Calcium: 9 mg/dL (ref 8.9–10.3)
GFR calc Af Amer: >60 mL/min (ref >60)
GLUCOSE: 99 mg/dL (ref 65–99)
Potassium: 3.4 mmol/L — ABNORMAL LOW (ref 3.5–5.1)
SODIUM: 135 mmol/L (ref 135–145)

## 2017-02-16 LAB — MAGNESIUM: Magnesium: 2 mg/dL (ref 1.7–2.4)

## 2017-02-16 LAB — CBC
HCT: 30.8 % — ABNORMAL LOW (ref 36.0–46.0)
Hemoglobin: 10.2 g/dL — ABNORMAL LOW (ref 12.0–15.0)
MCH: 31.6 pg (ref 26.0–34.0)
MCHC: 33.1 g/dL (ref 30.0–36.0)
MCV: 95.4 fL (ref 78.0–100.0)
PLATELETS: 771 10*3/uL — AB (ref 150–400)
RBC: 3.23 MIL/uL — ABNORMAL LOW (ref 3.87–5.11)
RDW: 13.7 % (ref 11.5–15.5)
WBC: 14.3 10*3/uL — ABNORMAL HIGH (ref 4.0–10.5)

## 2017-02-16 LAB — GLUCOSE, CAPILLARY
GLUCOSE-CAPILLARY: 89 mg/dL (ref 65–99)
GLUCOSE-CAPILLARY: 121 mg/dL — AB (ref 65–99)
GLUCOSE-CAPILLARY: 125 mg/dL — AB (ref 65–99)
GLUCOSE-CAPILLARY: 116 mg/dL — AB (ref 65–99)
Glucose-Capillary: 94 mg/dL (ref 65–99)

## 2017-02-16 LAB — PHOSPHORUS: Phosphorus: 3.1 mg/dL (ref 2.5–4.6)

## 2017-02-16 LAB — PROCALCITONIN: Procalcitonin: 0.17 ng/mL

## 2017-02-16 NOTE — Progress Notes (Signed)
PULMONARY / CRITICAL CARE MEDICINE   Name: Yolanda [name] MRN: [id number] DOB: May 14, 1934    ADMISSION DATE:  02/07/2017 CONSULTATION DATE:  02/10/2017  REFERRING MD:  Carolin Sicks  CHIEF COMPLAINT:  Dyspnea  SUMMARY:  83 y/o F with PMH of ETOH abuse and recent fall (5 wks PTA) with broken coccyx who has been bed bound since admitted 6/22 with increased SOB & dry cough.  CXR 6/24 demonstrated near complete opacification of the left hemithorax. A CT scan of the chest was performed showing a large pleural effusion and airway mucus.  Thoracentesis attempted with 390 ml of hazy yellow fluid > multiple loculations noted in the pleural space.  Deemed not a CVTS candidate and IR placed a Cook catheter > s/p tPA, pulmozyme.     SUBJECTIVE:   CT output is improving. ~ 50cc since yesterday No issues overnight.  VITAL SIGNS: BP (!) 194/77   Pulse 96   Temp 98.2 F (36.8 C) (Oral)   Resp (!) 25   Ht 5\' 1"  (1.549 m)   Wt 112 lb 14 oz (51.2 kg)   SpO2 98%   BMI 21.33 kg/m   HEMODYNAMICS:    VENTILATOR SETTINGS:    INTAKE / OUTPUT: I/O last 3 completed shifts: In: 826.9 [I.V.:476.9; IV Piggyback:350] Out: 2970 [Urine:2320; Drains:650]  PHYSICAL EXAMINATION: Blood pressure (!) 194/77, pulse 96, temperature 98.2 F (36.8 C), temperature source Oral, resp. rate (!) 25, height 5\' 1"  (1.549 m), weight 112 lb 14 oz (51.2 kg), SpO2 98 %. Gen:      No acute distress HEENT:  EOMI, sclera anicteric Neck:     No masses; no thyromegaly Lungs:    Clear to auscultation bilaterally; normal respiratory effort CV:         Regular rate and rhythm; no murmurs Abd:      + bowel sounds; soft, non-tender; no palpable masses, no distension Ext:    No edema; adequate peripheral perfusion Skin:      Warm and dry; no rash Neuro: alert and oriented x 3 Psych: normal mood and affect   LABS:  BMET  Recent Labs Lab 02/14/17 0309 02/15/17 0400 02/16/17 0739  NA 135 135 135  K 3.5 3.5 3.4*  CL 101  98* 95*  CO2 28 31 30   BUN 36* 27* 24*  CREATININE 0.38* 0.31* 0.38*  GLUCOSE 141* 109* 99    Electrolytes  Recent Labs Lab 02/13/17 0324 02/13/17 1726 02/14/17 0309 02/15/17 0400 02/16/17 0739  CALCIUM 8.4*  --  8.1* 8.5* 9.0  MG 1.8 1.7  --  2.0 2.0  PHOS 3.0 2.2*  --   --  3.1    CBC  Recent Labs Lab 02/14/17 0309 02/15/17 0400 02/16/17 0739  WBC 17.1* 13.0* 14.3*  HGB 9.0* 8.8* 10.2*  HCT 27.6* 26.7* 30.8*  PLT 479* 590* 771*    Coag's No results for input(s): APTT, INR in the last 168 hours.  Sepsis Markers  Recent Labs Lab 02/15/17 1104 02/16/17 0739  PROCALCITON 0.25 0.17    ABG  Recent Labs Lab 02/11/17 1548  PHART 7.395  PCO2ART 53.2*  PO2ART 128*    Liver Enzymes No results for input(s): AST, ALT, ALKPHOS, BILITOT, ALBUMIN in the last 168 hours.  Cardiac Enzymes  Recent Labs Lab 02/09/17 2321  TROPONINI <0.03    Glucose  Recent Labs Lab 02/15/17 1119 02/15/17 1621 02/15/17 1931 02/15/17 2307 02/16/17 0313 02/16/17 0756  GLUCAP 112* 97 95 86 94 89  Imaging Dg Chest Port 1 View  Result Date: 02/16/2017 CLINICAL DATA:  Acute respiratory failure EXAM: PORTABLE CHEST 1 VIEW COMPARISON:  02/15/2017 FINDINGS: Left-sided chest tube remains in place. No change in small moderate residual left pleural effusion and dense consolidation in the lingula and left base. Stable slightly enlarged cardiomediastinal silhouette with central vascular congestion and mild pulmonary edema. Aortic atherosclerosis. No pneumothorax. IMPRESSION: 1. No significant interval change in left pleural effusion, left basilar consolidation. 2. Continued mild cardiomegaly with central vascular congestion and mild pulmonary edema, not significantly changed. Electronically Signed   By: Donavan Foil M.D.   On: 02/16/2017 03:32     STUDIES:  CT chest 06/25 >> large left-sided pleural effusion with likely left lower lobe airway occlusion. Pleural Fluid 6/25 >>  glucose <20, WBC 11,561, lymph 8, turbid/yellow Pleural Cytology 6/25 >> acute inflammation, no malignant cells identified  CT Chest 6/28 >> left sided pigtail catheter with decrease in left side effusion, small to moderate residual pleural effusion noted, improved aeration in the LUL, LLL, passive atelectasis in the LLL, increased R pleural effusion (small to moderate), stable mediastinal adenopathy, cardiomegaly with small amt pericardial effusion   CULTURES: BCx2 6/22 >>  Pleural Culture 6/25 >> negative   ANTIBIOTICS: Azithro 6/22 >>  Ceftriaxone 6/22 >> 6/26 Cefepime 6/26 >>   SIGNIFICANT EVENTS: 6/22  Admit 6/25  PCCM consulted 6/26  Tx to ICU, NRB, increased WOB.  CVTS consulted 6/27  IR placement of small bore chest tube.  TPA, pulmozyme  6/28  CXR cleared    LINES/TUBES: ETT 6/26 >> 6/29 Left Pigtail CT 6/27 >>   DISCUSSION: 83 year old female admitted with bronchitis and failure to thrive.  She developed a rather acute onset of a large pleural effusion.  S/P Thora on 6/25 with noted multiple loculations on [place] assessment with concern for empyema.   ASSESSMENT / PLAN:  PULMONARY A: Acute Hypoxic Respiratory Failure  Acute bronchitis Large Left Pleural Effusion - loculated on [place] assessment per IR, concern for empyema.  Not a candidate for CVTS intervention per Dr. Cyndia Bent.   Small to Moderate R Pleural Effusion P:   Wean down O2 as tolerated DC chest tube Continue antibiotics. Follow Pct  CARDIOVASCULAR A: HTN - suspect pain / agitation related, family reports normal at baseline P: Tele monitoring  PRN hydralazine  HEME A: Anemia - suspect of chronic disease / ETOH use  P: Follow CBC  GI: A: Protein Calorie Malnutrition P: PO diet  NEURO A: ETOH Abuse  Anxiety  Chronic Pain  P: Off precedex drip D/C IV fentanyl, versed Use norco, oxy IR for pain  Stable for tele once CT is out.  Marshell Garfinkel MD [facility] Pager [phone] If no answer or after 3pm call: [phone] 02/16/2017, 9:31 AM

## 2017-02-17 ENCOUNTER — Inpatient Hospital Stay (HOSPITAL_COMMUNITY): Payer: 59

## 2017-02-17 LAB — BASIC METABOLIC PANEL
ANION GAP: 6 (ref 5–15)
BUN: 21 mg/dL — ABNORMAL HIGH (ref 6–20)
CALCIUM: 8.8 mg/dL — AB (ref 8.9–10.3)
CHLORIDE: 98 mmol/L — AB (ref 101–111)
CO2: 32 mmol/L (ref 22–32)
CREATININE: 0.45 mg/dL (ref 0.44–1.00)
GFR calc non Af Amer: >60 mL/min (ref >60)
Glucose, Bld: 97 mg/dL (ref 65–99)
Potassium: 3.5 mmol/L (ref 3.5–5.1)
SODIUM: 136 mmol/L (ref 135–145)

## 2017-02-17 LAB — CBC
HCT: 29.2 % — ABNORMAL LOW (ref 36.0–46.0)
HEMOGLOBIN: 9.8 g/dL — AB (ref 12.0–15.0)
MCH: 31.7 pg (ref 26.0–34.0)
MCHC: 33.6 g/dL (ref 30.0–36.0)
MCV: 94.5 fL (ref 78.0–100.0)
PLATELETS: 471 10*3/uL — AB (ref 150–400)
RBC: 3.09 MIL/uL — AB (ref 3.87–5.11)
RDW: 13.7 % (ref 11.5–15.5)
WBC: 14.6 10*3/uL — AB (ref 4.0–10.5)

## 2017-02-17 LAB — CULTURE, BODY FLUID W GRAM STAIN -BOTTLE: Culture: NO GROWTH

## 2017-02-17 LAB — CULTURE, BODY FLUID-BOTTLE

## 2017-02-17 LAB — PHOSPHORUS: Phosphorus: 2.5 mg/dL (ref 2.5–4.6)

## 2017-02-17 LAB — GLUCOSE, CAPILLARY
GLUCOSE-CAPILLARY: 101 mg/dL — AB (ref 65–99)
GLUCOSE-CAPILLARY: 102 mg/dL — AB (ref 65–99)
GLUCOSE-CAPILLARY: 125 mg/dL — AB (ref 65–99)
GLUCOSE-CAPILLARY: 97 mg/dL (ref 65–99)
Glucose-Capillary: 121 mg/dL — ABNORMAL HIGH (ref 65–99)

## 2017-02-17 LAB — PROCALCITONIN: Procalcitonin: 0.29 ng/mL

## 2017-02-17 LAB — MAGNESIUM: MAGNESIUM: 2 mg/dL (ref 1.7–2.4)

## 2017-02-17 MED ORDER — IPRATROPIUM-ALBUTEROL 0.5-2.5 (3) MG/3ML IN SOLN
3.0000 mL | Freq: Three times a day (TID) | RESPIRATORY_TRACT | Status: DC
  Administered 2017-02-18 – 2017-02-20 (×7): 3 mL via RESPIRATORY_TRACT
  Filled 2017-02-17 (×7): qty 3

## 2017-02-17 MED ORDER — CEFPODOXIME PROXETIL 200 MG PO TABS
200.0000 mg | ORAL_TABLET | Freq: Two times a day (BID) | ORAL | Status: DC
  Administered 2017-02-17 – 2017-02-20 (×7): 200 mg via ORAL
  Filled 2017-02-17 (×7): qty 1

## 2017-02-17 MED ORDER — ENSURE ENLIVE PO LIQD
237.0000 mL | Freq: Two times a day (BID) | ORAL | Status: DC
  Administered 2017-02-17 – 2017-02-18 (×2): 237 mL via ORAL

## 2017-02-17 MED ORDER — FAMOTIDINE 20 MG PO TABS
20.0000 mg | ORAL_TABLET | Freq: Two times a day (BID) | ORAL | Status: DC
  Administered 2017-02-17 – 2017-02-20 (×7): 20 mg via ORAL
  Filled 2017-02-17 (×7): qty 1

## 2017-02-17 NOTE — Progress Notes (Signed)
Pharmacy Antibiotic Note  Yolanda [name] is a 83 y.o. female admitted on 02/07/2017 with c/o cough and subsequently started to azithromycin and ceftriaxone for acute bronchitis. Chest CT on 6/25 showed large left pleural effusion --> s/p thoracentesis on 6/25.  Antibiotics changed from ceftriaxone to cefepime on 6/26 for suspected empyema.  Today is day #11 antibiotics. Patient has lost IV access.  CrCl stable.  Cultures negative to date.  Plan: - No dose adjustment to Cefepime 2gm IV q24h. - Duration of therapy per MD. - If unable to obtain IV access, suggest PO Vantin to complete treatment course or PO Levaquin if feel continued Pseudomonas coverage needed. _____________________________  Height: 5\' 1"  (154.9 cm) Weight: 115 lb 1.3 oz (52.2 kg) IBW/kg (Calculated) : 47.8  Temp (24hrs), Avg:98.5 F (36.9 C), Min:98.3 F (36.8 C), Max:98.6 F (37 C)   Recent Labs Lab 02/13/17 0324 02/14/17 0309 02/15/17 0400 02/16/17 0739 02/17/17 0417  WBC 18.0* 17.1* 13.0* 14.3* 14.6*  CREATININE 0.47 0.38* 0.31* 0.38* 0.45    Estimated Creatinine Clearance: 42.3 mL/min (by C-G formula based on SCr of 0.45 mg/dL).    No Known Allergies  Antimicrobials this admission:  6/22 CTX>> 6/26 6/22 azithro>> 6/29 6/26 cefepime>>  Dose adjustments this admission:  --  Microbiology results:  6/22 BCx x2: NG-F 6/25 pleural fluid: NG-F 6/26 MRSA PCR: neg 6/27 pleural fluid: NGTD  Thank you for allowing pharmacy to be a part of this patient's care.  Hershal Coria 02/17/2017 1:16 PM

## 2017-02-17 NOTE — Progress Notes (Signed)
Nutrition Follow-up  DOCUMENTATION CODES:   Non-severe (moderate) malnutrition in context of chronic illness  INTERVENTION:   Provide Ensure Enlive po BID, each supplement provides 350 kcal and 20 grams of protein Encourage PO intake RD will continue to monitor  NUTRITION DIAGNOSIS:   Malnutrition (moderate) related to chronic illness (ETOH abuse) as evidenced by mild depletion of body fat, moderate depletions of muscle mass.  Ongoing.  GOAL:   Patient will meet greater than or equal to 90% of their needs  Not meeting.  MONITOR:   PO intake, Supplement acceptance, Labs, Weight trends, I & O's  ASSESSMENT:   83 year old female with history of prior stroke, alcohol abuse and drinks about 6 glasses of wine everyday, fall sustaining coccygeal fracture about 5 weeks ago brought in by patient's son further evaluation of failure to thrive, cough. In the ER patient was found to have hypoxia in room air with leukocytosis. Chest x-ray with no acute finding. Treated with antibiotics and admitted for further evaluation. 6/29: extubated  Patient in room with family and MD at bedside. Pt's last recorded PO intake was 30% on 6/29. Pt now on a regular diet. Chest tube planned to be d/c today per pulmonology -output 50 ml. Will order Ensure supplements and assess supplement acceptance at follow-up.  Labs reviewed. Medications: Folic acid tablet daily, Multivitamin with minerals daily, Miralax packet daily, Thiamine tablet daily, Zofran tablet PRN   Diet Order:  Diet regular Room service appropriate? Yes; Fluid consistency: Thin  Skin:  Reviewed, no issues  Last BM:  6/30  Height:   Ht Readings from Last 1 Encounters:  02/11/17 5\' 1"  (1.549 m)    Weight:   Wt Readings from Last 1 Encounters:  02/17/17 115 lb 1.3 oz (52.2 kg)    Ideal Body Weight:  50 kg  BMI:  Body mass index is 21.74 kg/m.  Estimated Nutritional Needs:   Kcal:  5427-0623  Protein:  65-75  grams  Fluid:  1.6L/day  EDUCATION NEEDS:   No education needs identified at this time  Clayton Bibles, MS, RD, LDN Pager: [phone] After Hours Pager: [phone]

## 2017-02-17 NOTE — Progress Notes (Signed)
PROGRESS NOTE  Yolanda [name]  GUR:[id number] DOB: 1933-11-26 DOA: 02/07/2017 PCP: Javier Glazier, MD   Brief Narrative: Yolanda [name] is a 83 y.o. female with a history of EtOH abuse and recent falls sustaining coccyx fracture who was admitted from [facility] 6/22 with dyspnea, cough, began treatment for acute bronchitis. Follow up CXR 6/24 demonstrated nearly complete opacification of left hemithorax. Subsequent CT demonstrated large pleural effusion with thoracentesis on 6/25 yielding 32ml hazy fluid and multiple loculations noted, concerning for empyema. CVTS was consulted and the patient was deemed not a VATS candidate. IR placed chest tube 6/27, given tPA and pulmozyme with improvement in clinical picture, extubated 6/29. Chest tube output decreased and was pulled 7/1, out to [facility] 7/2.   Assessment & Plan: Principal Problem:   Acute bronchitis Active Problems:   Non-compliant behavior   ETOH abuse   Fall   Coccyx pain   Leukocytosis   Severe protein-calorie malnutrition (HCC)   Hypoxia   Pleural effusion on left   Acute respiratory failure with hypoxemia (HCC)   Empyema lung (HCC)  Acute hypoxic respiratory failure: Due to left-sided empyema and bronchitis, possible still LLL pneumonia on CXR 7/1. CVTS, Dr. Cyndia Bent, deemed pt not VATS candidate.  - Continue antibiotics. Lost IV, but pt taking good po, will transition to vantin today.  - Trending PCT, WBC still elevated at 14.6 - Chest tube removed 7/1, will monitor closely for reworsening - Bronchodilators scheduled and prn  EtOH abuse: Weaned off precedex, doubt ongoing withdrawal on day 9 of hospitalization.  - Folate, thiamine  HTN:  - Monitor with prn hydralazine ordered  Anemia of chronic disease: Stable.  - Monitor  Protein-calorie malnutrition:  - Nutrition consulted  Chronic pain:  - Restarted on home medication only. No IV.   Depression/insomnia:  - Restart home fluoxetine and trazodone  DVT prophylaxis:  Heparin Code Status: Full Family Communication: Son at bedside Disposition Plan: Per therapy recommendations  Consultants:   PCCM primary until 7/2  IR    Procedures:  ETT 6/26 >> 6/29 Left Pigtail CT 6/27 >>   Antimicrobials: Azithro 6/22 >>  Ceftriaxone 6/22 >> 6/26 Cefepime 6/26 >>   Subjective: Pt feels better, dyspnea improved, no chest pain or palpitations.   Objective: Vitals:   02/16/17 1603 02/16/17 2139 02/17/17 0617 02/17/17 0853  BP: (!) 145/78 (!) 156/63 (!) 148/59   Pulse: 97 95 92   Resp: 16 14 14    Temp: 98.6 F (37 C) 98.6 F (37 C) 98.6 F (37 C)   TempSrc: Oral Oral Oral   SpO2: 100% 95% 97% 93%  Weight:   52.2 kg (115 lb 1.3 oz)   Height:        Intake/Output Summary (Last 24 hours) at 02/17/17 1533 Last data filed at 02/17/17 0618  Gross per 24 hour  Intake              360 ml  Output             1010 ml  Net             -650 ml   Filed Weights   02/15/17 0454 02/16/17 0512 02/17/17 0617  Weight: 53.7 kg (118 lb 6.2 oz) 51.2 kg (112 lb 14 oz) 52.2 kg (115 lb 1.3 oz)    Examination: General exam: Thin elderly female in no distress Respiratory system: Non-labored breathing. Clear to auscultation bilaterally with decreased LLL sounds.  Cardiovascular system: Regular rate and rhythm. No  murmur, rub, or gallop. No JVD, and no pedal edema. Gastrointestinal system: Abdomen soft, non-tender, non-distended, with normoactive bowel sounds. No organomegaly or masses felt. Central nervous system: Alert and oriented. No focal neurological deficits. Extremities: Warm, no deformities Skin: No rashes, lesions no ulcers Psychiatry: Judgement and insight appear normal. Mood & affect appropriate.   Data Reviewed: I have personally reviewed following labs and imaging studies  CBC:  Recent Labs Lab 02/11/17 0303 02/12/17 0254 02/13/17 0324 02/14/17 0309 02/15/17 0400 02/16/17 0739 02/17/17 0417  WBC 29.3* 24.3* 18.0* 17.1* 13.0* 14.3* 14.6*    NEUTROABS 26.6* 21.4*  --   --   --   --   --   HGB 9.9* 9.0* 9.5* 9.0* 8.8* 10.2* 9.8*  HCT 31.1* 27.8* 28.7* 27.6* 26.7* 30.8* 29.2*  MCV 100.0 97.2 94.4 94.2 96.0 95.4 94.5  PLT 474* 433* 505* 479* 590* 771* 350*   Basic Metabolic Panel:  Recent Labs Lab 02/12/17 1710 02/13/17 0324 02/13/17 1726 02/14/17 0309 02/15/17 0400 02/16/17 0739 02/17/17 0417  NA  --  135  --  135 135 135 136  K  --  4.0  --  3.5 3.5 3.4* 3.5  CL  --  100*  --  101 98* 95* 98*  CO2  --  28  --  28 31 30  32  GLUCOSE  --  161*  --  141* 109* 99 97  BUN  --  32*  --  36* 27* 24* 21*  CREATININE  --  0.47  --  0.38* 0.31* 0.38* 0.45  CALCIUM  --  8.4*  --  8.1* 8.5* 9.0 8.8*  MG 2.1 1.8 1.7  --  2.0 2.0 2.0  PHOS 3.1 3.0 2.2*  --   --  3.1 2.5   GFR: Estimated Creatinine Clearance: 42.3 mL/min (by C-G formula based on SCr of 0.45 mg/dL). Liver Function Tests: No results for input(s): AST, ALT, ALKPHOS, BILITOT, PROT, ALBUMIN in the last 168 hours. No results for input(s): LIPASE, AMYLASE in the last 168 hours. No results for input(s): AMMONIA in the last 168 hours. Coagulation Profile: No results for input(s): INR, PROTIME in the last 168 hours. Cardiac Enzymes: No results for input(s): CKTOTAL, CKMB, CKMBINDEX, TROPONINI in the last 168 hours. BNP (last 3 results) No results for input(s): PROBNP in the last 8760 hours. HbA1C: No results for input(s): HGBA1C in the last 72 hours. CBG:  Recent Labs Lab 02/16/17 1529 02/16/17 2001 02/17/17 0018 02/17/17 0622 02/17/17 1209  GLUCAP 125* 116* 101* 97 102*   Lipid Profile: No results for input(s): CHOL, HDL, LDLCALC, TRIG, CHOLHDL, LDLDIRECT in the last 72 hours. Thyroid Function Tests: No results for input(s): TSH, T4TOTAL, FREET4, T3FREE, THYROIDAB in the last 72 hours. Anemia Panel: No results for input(s): VITAMINB12, FOLATE, FERRITIN, TIBC, IRON, RETICCTPCT in the last 72 hours. Urine analysis:    Component Value Date/Time    COLORURINE AMBER (A) 02/07/2017 1159   APPEARANCEUR HAZY (A) 02/07/2017 1159   LABSPEC 1.020 02/07/2017 1159   PHURINE 5.0 02/07/2017 1159   GLUCOSEU NEGATIVE 02/07/2017 1159   HGBUR MODERATE (A) 02/07/2017 1159   BILIRUBINUR NEGATIVE 02/07/2017 1159   BILIRUBINUR neg 01/23/2012 1445   KETONESUR 5 (A) 02/07/2017 1159   PROTEINUR 30 (A) 02/07/2017 1159   UROBILINOGEN 0.2 01/23/2012 1445   NITRITE NEGATIVE 02/07/2017 1159   LEUKOCYTESUR NEGATIVE 02/07/2017 1159   Recent Results (from the past 240 hour(s))  Culture, blood (routine x 2)  Status: None   Collection Time: 02/07/17  6:39 PM  Result Value Ref Range Status   Specimen Description BLOOD RIGHT ARM  Final   Special Requests IN PEDIATRIC BOTTLE Blood Culture adequate volume  Final   Culture   Final    NO GROWTH 5 DAYS Performed at [facility], [address], [place], [place] [address]    Report Status 02/13/2017 FINAL  Final  Culture, blood (routine x 2)     Status: None   Collection Time: 02/07/17  6:39 PM  Result Value Ref Range Status   Specimen Description BLOOD LEFT HAND  Final   Special Requests IN PEDIATRIC BOTTLE Blood Culture adequate volume  Final   Culture   Final    NO GROWTH 5 DAYS Performed at [facility], [place] [address], [place], [place] [address]    Report Status 02/13/2017 FINAL  Final  Body fluid culture     Status: None   Collection Time: 02/10/17  1:09 PM  Result Value Ref Range Status   Specimen Description PLEURAL LEFT  Final   Special Requests PLEURAL LEFT  Final   Gram Stain   Final    FEW WBC PRESENT, PREDOMINANTLY PMN NO ORGANISMS SEEN    Culture   Final    NO GROWTH 3 DAYS Performed at [facility], [place] [address], [place], [place] [address]    Report Status 02/14/2017 FINAL  Final  MRSA PCR Screening     Status: None   Collection Time: 02/11/17  1:20 AM  Result Value Ref Range Status   MRSA by PCR NEGATIVE NEGATIVE Final    Comment:        The GeneXpert MRSA  Assay (FDA approved for NASAL specimens only), is one component of a comprehensive MRSA colonization surveillance program. It is not intended to diagnose MRSA infection nor to guide or monitor treatment for MRSA infections.   Culture, body fluid-bottle     Status: None   Collection Time: 02/12/17  9:25 AM  Result Value Ref Range Status   Specimen Description PLEURAL LEFT  Final   Special Requests NONE  Final   Culture   Final    NO GROWTH 5 DAYS Performed at [facility], [address], [place], [place] [address]    Report Status 02/17/2017 FINAL  Final  Gram stain     Status: None   Collection Time: 02/12/17  9:25 AM  Result Value Ref Range Status   Specimen Description PLEURAL LEFT  Final   Special Requests   Final    NONE Performed at [facility], [place] [address] [place], [place] [address]    Gram Stain   Final    FEW WBC PRESENT,BOTH PMN AND MONONUCLEAR NO ORGANISMS SEEN    Report Status 02/12/2017 FINAL  Final      Radiology Studies: Dg Chest Port 1 View  Result Date: 02/17/2017 CLINICAL DATA:  Respiratory failure . EXAM: PORTABLE CHEST 1 VIEW COMPARISON:  02/16/2017. FINDINGS: Cardiomegaly with pulmonary vascular congestion and bilateral interstitial prominence consistent CHF. Left lower lobe atelectasis and consolidation. Bilateral small pleural effusions, left side greater right. No pneumothorax. Neurostimulator stable position. Surgical clips right axilla scratched it surgical clips noted over the chest. IMPRESSION: 1. Congestive heart failure with pulmonary interstitial edema and small bilateral pleural effusions left side greater than right. 2. Left lower lobe atelectasis and consolidation. Left lower lobe pneumonia cannot be excluded. Electronically Signed  By: [place]   On: 02/17/2017 06:57   Dg Chest Port 1 View  Result Date: 02/16/2017 CLINICAL DATA:  Left chest tube removal. EXAM: PORTABLE CHEST 1 VIEW COMPARISON:  Earlier same day  FINDINGS: Left chest tube is been removed. No pneumothorax. Pleural and parenchymal density at the left base appears unchanged. IMPRESSION: No pneumothorax following left chest tube removal. Electronically Signed   By: Nelson Chimes M.D.   On: 02/16/2017 11:44   Dg Chest Port 1 View  Result Date: 02/16/2017 CLINICAL DATA:  Acute respiratory failure EXAM: PORTABLE CHEST 1 VIEW COMPARISON:  02/15/2017 FINDINGS: Left-sided chest tube remains in place. No change in small moderate residual left pleural effusion and dense consolidation in the lingula and left base. Stable slightly enlarged cardiomediastinal silhouette with central vascular congestion and mild pulmonary edema. Aortic atherosclerosis. No pneumothorax. IMPRESSION: 1. No significant interval change in left pleural effusion, left basilar consolidation. 2. Continued mild cardiomegaly with central vascular congestion and mild pulmonary edema, not significantly changed. Electronically Signed   By: Donavan Foil M.D.   On: 02/16/2017 03:32    Scheduled Meds: . famotidine  20 mg Oral BID  . feeding supplement (ENSURE ENLIVE)  237 mL Oral BID BM  . FLUoxetine  20 mg Oral Daily  . folic acid  1 mg Oral Daily  . guaiFENesin  20 mL Oral BID  . heparin subcutaneous  5,000 Units Subcutaneous Q8H  . ipratropium-albuterol  3 mL Nebulization Q6H  . multivitamin with minerals  1 tablet Oral Daily  . polyethylene glycol  17 g Oral Daily  . thiamine  100 mg Oral Daily   Continuous Infusions: . sodium chloride 10 mL/hr at 02/16/17 0700  . ceFEPime (MAXIPIME) IV Stopped (02/16/17 1100)     LOS: 9 days   Time spent: 25 minutes.  Vance Gather, MD [facility] Pager [phone]  If 7PM-7AM, please contact night-coverage [email or web address] Password [facility] 02/17/2017, 3:34 PM

## 2017-02-17 NOTE — Progress Notes (Signed)
Pt & family refused IV re-start; DR aware.

## 2017-02-17 NOTE — Progress Notes (Signed)
PHARMACIST - PHYSICIAN COMMUNICATION CONCERNING: IV to Oral Route Change Policy  RECOMMENDATION: This patient is receiving Pepcid by the intravenous route.  Based on criteria approved by the Pharmacy and Therapeutics Committee, the intravenous medication(s) is/are being converted to the equivalent oral dose form(s).   DESCRIPTION: These criteria include:  The patient is eating (either orally or via tube) and/or has been taking other orally administered medications for a least 24 hours  The patient has no evidence of active gastrointestinal bleeding or impaired GI absorption (gastrectomy, short bowel, patient on TNA or NPO).  If you have questions about this conversion, please contact the Pharmacy Department  []   [phone] )  [facility] []   [phone] )  [facility] []   [phone] )  [facility] []   [phone] )  [facility] [x]   [phone] )  [place], [facility] 02/17/2017 1:08 PM

## 2017-02-18 LAB — GLUCOSE, CAPILLARY
GLUCOSE-CAPILLARY: 107 mg/dL — AB (ref 65–99)
Glucose-Capillary: 109 mg/dL — ABNORMAL HIGH (ref 65–99)
Glucose-Capillary: 113 mg/dL — ABNORMAL HIGH (ref 65–99)
Glucose-Capillary: 115 mg/dL — ABNORMAL HIGH (ref 65–99)
Glucose-Capillary: 122 mg/dL — ABNORMAL HIGH (ref 65–99)

## 2017-02-18 LAB — BASIC METABOLIC PANEL
ANION GAP: 7 (ref 5–15)
BUN: 21 mg/dL — AB (ref 6–20)
CHLORIDE: 95 mmol/L — AB (ref 101–111)
CO2: 33 mmol/L — AB (ref 22–32)
Calcium: 8.9 mg/dL (ref 8.9–10.3)
Creatinine, Ser: 0.49 mg/dL (ref 0.44–1.00)
GFR calc Af Amer: >60 mL/min (ref >60)
GFR calc non Af Amer: >60 mL/min (ref >60)
GLUCOSE: 149 mg/dL — AB (ref 65–99)
POTASSIUM: 3.6 mmol/L (ref 3.5–5.1)
Sodium: 135 mmol/L (ref 135–145)

## 2017-02-18 LAB — CBC
HCT: 31 % — ABNORMAL LOW (ref 36.0–46.0)
HEMOGLOBIN: 10.1 g/dL — AB (ref 12.0–15.0)
MCH: 31.4 pg (ref 26.0–34.0)
MCHC: 32.6 g/dL (ref 30.0–36.0)
MCV: 96.3 fL (ref 78.0–100.0)
Platelets: 793 10*3/uL — ABNORMAL HIGH (ref 150–400)
RBC: 3.22 MIL/uL — AB (ref 3.87–5.11)
RDW: 13.9 % (ref 11.5–15.5)
WBC: 18.3 10*3/uL — AB (ref 4.0–10.5)

## 2017-02-18 NOTE — Progress Notes (Signed)
Physical Therapy Re-Evaluation Patient Details Name: Yolanda [name] MRN: [id number] DOB: 1933-12-12 Today's Date: 02/18/2017    History of Present Illness 83 y.o. female admitted with FTT, acute bronchitis, acute resp failure. Intubated 6/26. Extubated 6/29. Chest tube 6/27-removed 7/1. Hx of ETOH abuse, CVA, falls, recent coccyx fx. Pt is from [facility]    PT Comments    On re-eval, pt required Mod assist for mobility. She was able to stand and pivot from recliner to bed with a RW. Pt c/o pain and appeared very anxious. Son present during session. Discussed d/c plan-pt will go for ST rehab at SNF prior to returning to her ind living apt. Will follow and progress activity as tolerated.    Follow Up Recommendations  SNF     Equipment Recommendations  None recommended by PT    Recommendations for Other Services       Precautions / Restrictions Precautions Precautions: Fall Precaution Comments: monitor O2 Restrictions Weight Bearing Restrictions: No    Mobility  Bed Mobility Overal bed mobility: Needs Assistance Bed Mobility: Sit to Supine      Sit to supine: Min guard;HOB elevated   General bed mobility comments: Close guard for safety.   Transfers Overall transfer level: Needs assistance Equipment used: Rolling walker (2 wheeled) Transfers: Sit to/from Stand Sit to Stand: Mod assist Stand pivot transfers: Mod assist       General transfer comment: Assist to rise, stabilize pt, control descent, maneuver with RW. VCs safety, technique, hand placement. Stand pivot, recliner to bed, with RW. Pt is very anxious. Repeatedly stated "I cant do this."   Ambulation/Gait             General Gait Details: NT-pt c/o pain, requesting to return to bed and experiencing significant anxiety.    Stairs            Wheelchair Mobility    Modified Rankin (Stroke Patients Only)       Balance Overall balance assessment: Needs assistance;History of Falls          Standing balance support: Bilateral upper extremity supported Standing balance-Leahy Scale: Poor                              Cognition Arousal/Alertness: Awake/alert Behavior During Therapy: Anxious Overall Cognitive Status: Within Functional Limits for tasks assessed                                        Exercises      General Comments        Pertinent Vitals/Pain Pain Assessment: Faces Faces Pain Scale: Hurts even more Pain Location: buttocks, back Pain Descriptors / Indicators: Grimacing;Sore;Discomfort Pain Intervention(s): Limited activity within patient's tolerance;Repositioned    Home Living                      Prior Function            PT Goals (current goals can now be found in the care plan section) Progress towards PT goals: Progressing toward goals    Frequency    Min 2X/week      PT Plan Current plan remains appropriate    Co-evaluation              AM-PAC PT "6 Clicks" Daily Activity  Outcome Measure  Difficulty turning over  in bed (including adjusting bedclothes, sheets and blankets)?: A Lot Difficulty moving from lying on back to sitting on the side of the bed? : A Lot Difficulty sitting down on and standing up from a chair with arms (e.g., wheelchair, bedside commode, etc,.)?: Total Help needed moving to and from a bed to chair (including a wheelchair)?: A Lot Help needed walking in hospital room?: A Lot Help needed climbing 3-5 steps with a railing? : A Lot 6 Click Score: 11    End of Session Equipment Utilized During Treatment: Gait belt;Oxygen Activity Tolerance: Patient limited by fatigue;Patient limited by pain Patient left: in bed;with call bell/phone within reach;with family/visitor present;with bed alarm set   PT Visit Diagnosis: Muscle weakness (generalized) (M62.81);Difficulty in walking, not elsewhere classified (R26.2)     Time: 2426-8341 PT Time Calculation (min) (ACUTE  ONLY): 8 min  Charges:                       G Codes:          Weston Anna, MPT Pager: [phone]

## 2017-02-18 NOTE — Progress Notes (Signed)
Occupational Therapy Treatment Patient Details Name: Yolanda [name] MRN: [id number] DOB: [birth year] Today's Date: 02/18/2017    History of present illness Yolanda [name] is a 83 y.o. female with medical history significant of remote CVA and alcohol abuse, drinks 5-6 glasses of wine per day. Patient brought to the hospital by her 2 kids because of failure to thrive. Reports a fall 5 weeks ago with bruised her tailbone, since then she was unable to ambulate very well, reported spending most of the time in the bed, had less appetite and oral intake.   OT comments  Pt agreed to sit EOB then get in chair  Follow Up Recommendations  SNF;Supervision/Assistance - 24 hour    Equipment Recommendations   (defer next venue)    Recommendations for Other Services      Precautions / Restrictions Precautions Precautions: Fall       Mobility Bed Mobility Overal bed mobility: Needs Assistance Bed Mobility: Supine to Sit     Supine to sit: Mod assist Sit to supine: Max assist      Transfers Overall transfer level: Needs assistance Equipment used: 1 person hand held assist Transfers: Sit to/from [facility] Sit to Stand: Mod assist;Max assist Stand pivot transfers: Mod assist;Max assist                ADL either performed or assessed with clinical judgement   ADL Overall ADL's : Needs assistance/impaired Eating/Feeding: Set up;Sitting   Grooming: Minimal assistance;Sitting           Upper Body Dressing : Moderate assistance;Sitting   Lower Body Dressing: Maximal assistance;Sit to/from stand;Cueing for sequencing       Toileting- Clothing Manipulation and Hygiene: Maximal assistance;Cueing for safety;Cueing for sequencing;Sit to/from stand Toileting - Clothing Manipulation Details (indicate cue type and reason): bed to chair             Vision Patient Visual Report: No change from baseline            Cognition Arousal/Alertness:  Awake/alert Behavior During Therapy: WFL for tasks assessed/performed Overall Cognitive Status: Within Functional Limits for tasks assessed                                                     Pertinent Vitals/ Pain       Pain Assessment: No/denies pain     Prior Functioning/Environment              Frequency  Min 2X/week        Progress Toward Goals  OT Goals(current goals can now be found in the care plan section)  Progress towards OT goals: Progressing toward goals     Plan Discharge plan remains appropriate       AM-PAC PT "6 Clicks" Daily Activity     Outcome Measure   Help from another person eating meals?: A Little Help from another person taking care of personal grooming?: A Little Help from another person toileting, which includes using toliet, bedpan, or urinal?: A Lot Help from another person bathing (including washing, rinsing, drying)?: A Lot Help from another person to put on and taking off regular upper body clothing?: A Lot Help from another person to put on and taking off regular lower body clothing?: A Lot 6 Click Score: 14    End of Session Equipment  Utilized During Treatment: Oxygen  OT Visit Diagnosis: Unsteadiness on feet (R26.81);Muscle weakness (generalized) (M62.81)   Activity Tolerance Patient tolerated treatment well   Patient Left with call bell/phone within reach;in chair;with family/visitor present   Nurse Communication Mobility status        Time: 0086-7619 OT Time Calculation (min): 34 min  Charges: OT General Charges $OT Visit: 1 Procedure OT Treatments $Self Care/Home Management : 23-37 mins  [place], [place]   Payton Mccallum D 02/18/2017, 10:44 AM

## 2017-02-18 NOTE — Progress Notes (Addendum)
LCSW continues to follow for disposition of needs  Current recommendation: SNF  LCSW has sent additional progress notes to facility to continue with authorization for SNF.  Will continue to assist in acute setting and with discharge plans.  Pending: Insurance auth for SNF.  Will discuss with patient and son regarding next steps as we plan for discharge.  Lane Hacker, MSW Clinical Social Work: [employer] Coverage for :  [phone]

## 2017-02-18 NOTE — Progress Notes (Addendum)
PROGRESS NOTE  Yolanda [name]  TDS:[id number] DOB: December 03, 1933 DOA: 02/07/2017 PCP: Javier Glazier, MD   Brief Narrative: Yolanda [name] is a 83 y.o. female with a history of EtOH abuse and recent falls sustaining coccyx fracture who was admitted from [place] 6/22 with dyspnea, cough, began treatment for acute bronchitis. Follow up CXR 6/24 demonstrated nearly complete opacification of left hemithorax. Subsequent CT demonstrated large pleural effusion with thoracentesis on 6/25 yielding 339ml hazy fluid and multiple loculations noted, concerning for empyema. CVTS was consulted and the patient was deemed not a VATS candidate. IR placed chest tube 6/27, given tPA and pulmozyme with improvement in clinical picture, extubated 6/29. Chest tube output decreased and was pulled 7/1, out to [facility] 7/2. The patient has remained stable with flat fever curve, improving functional status. SNF is recommended disposition.   Assessment & Plan: Principal Problem:   Acute bronchitis Active Problems:   Non-compliant behavior   ETOH abuse   Fall   Coccyx pain   Leukocytosis   Severe protein-calorie malnutrition (HCC)   Hypoxia   Pleural effusion on left   Acute respiratory failure with hypoxemia (HCC)   Empyema lung (HCC)  Acute hypoxic respiratory failure: Due to left-sided empyema and bronchitis, possible still LLL pneumonia on CXR 7/1. CVTS, Dr. Cyndia Bent, deemed pt not VATS candidate.  - Continue antibiotics. Transitioned to vantin 7/2. - Trending PCT, WBC still elevated at 14.6 - Chest tube removed 7/1 - Bronchodilators scheduled and prn  EtOH abuse: Weaned off precedex, doubt ongoing withdrawal - Folate, thiamine  HTN:  - Monitor with prn hydralazine ordered  Anemia of chronic disease: Stable.  - Monitor  Protein-calorie malnutrition:  - Nutrition consulted  Chronic pain:  - Restarted on home medication only. No IV.   Coccyx fracture: No surgery indicated.  - Analgesia,  PT  Depression/insomnia:  - Restarted home fluoxetine and trazodone  DVT prophylaxis: Heparin Code Status: Full Family Communication: Son at bedside Disposition Plan: SNF when insurance authorized.  Consultants:   PCCM primary until 7/2  IR  Procedures:  ETT 6/26 >> 6/29 Left Pigtail CT 6/27 >> 7/1  Antimicrobials: Azithro 6/22 >> 6/28 Ceftriaxone 6/22 >> 6/26 Cefepime 6/26 >>   Subjective: No complaints. No fever, dyspnea is slowly improving, but still limits activity. Not using IS. No chest pain.   Objective: BP 139/71 (BP Location: Left Arm)   Pulse 93   Temp 99 F (37.2 C) (Oral)   Resp 16   Ht 5\' 1"  (1.549 m)   Wt 52.2 kg (115 lb)   SpO2 91%   BMI 21.73 kg/m   General exam: Thin elderly female in no distress Respiratory system: Non-labored breathing. Clear to auscultation bilaterally with decreased LLL sounds.  Cardiovascular system: Regular rate and rhythm. No murmur, rub, or gallop. No JVD, and no pedal edema. Gastrointestinal system: Abdomen soft, non-tender, non-distended, with normoactive bowel sounds. No organomegaly or masses felt. Central nervous system: Alert and oriented. No focal neurological deficits. Extremities: Warm, no deformities Skin: No rashes, lesions no ulcers Psychiatry: Judgement and insight appear normal. Mood & affect appropriate.   Data Reviewed: I have personally reviewed following labs and imaging studies  CBC:  Recent Labs Lab 02/12/17 0254  02/14/17 0309 02/15/17 0400 02/16/17 0739 02/17/17 0417 02/18/17 0844  WBC 24.3*  < > 17.1* 13.0* 14.3* 14.6* 18.3*  NEUTROABS 21.4*  --   --   --   --   --   --   HGB 9.0*  < >  9.0* 8.8* 10.2* 9.8* 10.1*  HCT 27.8*  < > 27.6* 26.7* 30.8* 29.2* 31.0*  MCV 97.2  < > 94.2 96.0 95.4 94.5 96.3  PLT 433*  < > 479* 590* 771* 471* 793*  < > = values in this interval not displayed. Basic Metabolic Panel:  Recent Labs Lab 02/12/17 1710 02/13/17 0324 02/13/17 1726 02/14/17 0309  02/15/17 0400 02/16/17 0739 02/17/17 0417 02/18/17 0844  NA  --  135  --  135 135 135 136 135  K  --  4.0  --  3.5 3.5 3.4* 3.5 3.6  CL  --  100*  --  101 98* 95* 98* 95*  CO2  --  28  --  28 31 30  32 33*  GLUCOSE  --  161*  --  141* 109* 99 97 149*  BUN  --  32*  --  36* 27* 24* 21* 21*  CREATININE  --  0.47  --  0.38* 0.31* 0.38* 0.45 0.49  CALCIUM  --  8.4*  --  8.1* 8.5* 9.0 8.8* 8.9  MG 2.1 1.8 1.7  --  2.0 2.0 2.0  --   PHOS 3.1 3.0 2.2*  --   --  3.1 2.5  --    GFR: Estimated Creatinine Clearance: 42.3 mL/min (by C-G formula based on SCr of 0.49 mg/dL).  CBG:  Recent Labs Lab 02/17/17 1726 02/17/17 2011 02/18/17 0001 02/18/17 0723 02/18/17 1204  GLUCAP 125* 121* 109* 113* 115*   Urine analysis:    Component Value Date/Time   COLORURINE AMBER (A) 02/07/2017 1159   APPEARANCEUR HAZY (A) 02/07/2017 1159   LABSPEC 1.020 02/07/2017 1159   PHURINE 5.0 02/07/2017 1159   GLUCOSEU NEGATIVE 02/07/2017 1159   HGBUR MODERATE (A) 02/07/2017 1159   BILIRUBINUR NEGATIVE 02/07/2017 1159   BILIRUBINUR neg 01/23/2012 1445   KETONESUR 5 (A) 02/07/2017 1159   PROTEINUR 30 (A) 02/07/2017 1159   UROBILINOGEN 0.2 01/23/2012 1445   NITRITE NEGATIVE 02/07/2017 1159   LEUKOCYTESUR NEGATIVE 02/07/2017 1159   Recent Results (from the past 240 hour(s))  Body fluid culture     Status: None   Collection Time: 02/10/17  1:09 PM  Result Value Ref Range Status   Specimen Description PLEURAL LEFT  Final   Special Requests PLEURAL LEFT  Final   Gram Stain   Final    FEW WBC PRESENT, PREDOMINANTLY PMN NO ORGANISMS SEEN    Culture   Final    NO GROWTH 3 DAYS Performed at [facility], [place] [address], [place], [place] [address]    Report Status 02/14/2017 FINAL  Final  MRSA PCR Screening     Status: None   Collection Time: 02/11/17  1:20 AM  Result Value Ref Range Status   MRSA by PCR NEGATIVE NEGATIVE Final    Comment:        The GeneXpert MRSA Assay (FDA approved for NASAL  specimens only), is one component of a comprehensive MRSA colonization surveillance program. It is not intended to diagnose MRSA infection nor to guide or monitor treatment for MRSA infections.   Culture, body fluid-bottle     Status: None   Collection Time: 02/12/17  9:25 AM  Result Value Ref Range Status   Specimen Description PLEURAL LEFT  Final   Special Requests NONE  Final   Culture   Final    NO GROWTH 5 DAYS Performed at [facility], [address] [place], [place] [address]  Report Status 02/17/2017 FINAL  Final  Gram stain     Status: None   Collection Time: 02/12/17  9:25 AM  Result Value Ref Range Status   Specimen Description PLEURAL LEFT  Final   Special Requests   Final    NONE Performed at [facility], [address], [place], [place] [address]    Gram Stain   Final    FEW WBC PRESENT,BOTH PMN AND MONONUCLEAR NO ORGANISMS SEEN    Report Status 02/12/2017 FINAL  Final      Radiology Studies: Dg Chest Port 1 View  Result Date: 02/17/2017 CLINICAL DATA:  Respiratory failure . EXAM: PORTABLE CHEST 1 VIEW COMPARISON:  02/16/2017. FINDINGS: Cardiomegaly with pulmonary vascular congestion and bilateral interstitial prominence consistent CHF. Left lower lobe atelectasis and consolidation. Bilateral small pleural effusions, left side greater right. No pneumothorax. Neurostimulator stable position. Surgical clips right axilla scratched it surgical clips noted over the chest. IMPRESSION: 1. Congestive heart failure with pulmonary interstitial edema and small bilateral pleural effusions left side greater than right. 2. Left lower lobe atelectasis and consolidation. Left lower lobe pneumonia cannot be excluded. Electronically Signed   By: Marcello Moores  Register   On: 02/17/2017 06:57    Scheduled Meds: . cefpodoxime  200 mg Oral Q12H  . famotidine  20 mg Oral BID  . feeding supplement (ENSURE ENLIVE)  237 mL Oral BID BM  . FLUoxetine  20 mg Oral Daily  .  folic acid  1 mg Oral Daily  . guaiFENesin  20 mL Oral BID  . heparin subcutaneous  5,000 Units Subcutaneous Q8H  . ipratropium-albuterol  3 mL Nebulization TID  . multivitamin with minerals  1 tablet Oral Daily  . polyethylene glycol  17 g Oral Daily  . thiamine  100 mg Oral Daily   Continuous Infusions: . sodium chloride 10 mL/hr at 02/16/17 0700     LOS: 10 days   Time spent: 25 minutes.  Vance Gather, MD [facility] Pager [phone]  If 7PM-7AM, please contact night-coverage [email or web address] Password [email or web address] 02/18/2017, 3:23 PM

## 2017-02-18 NOTE — Progress Notes (Signed)
Pt refused night time po med   PT refused morning labs, morning cbgs, and morning vitals sings

## 2017-02-19 LAB — GLUCOSE, CAPILLARY
GLUCOSE-CAPILLARY: 113 mg/dL — AB (ref 65–99)
GLUCOSE-CAPILLARY: 153 mg/dL — AB (ref 65–99)
GLUCOSE-CAPILLARY: 114 mg/dL — AB (ref 65–99)
Glucose-Capillary: 124 mg/dL — ABNORMAL HIGH (ref 65–99)

## 2017-02-19 NOTE — Progress Notes (Addendum)
Patient ID: Yolanda [name], female   DOB: 05-13-1934, 83 y.o.   MRN: [id number]  PROGRESS NOTE    Yolanda [name]  QIH:[id number] DOB: [birth year] DOA: 02/07/2017  PCP: Javier Glazier, MD   Brief Narrative:  83 year old female with history of EtOH abuse, recent fall and coccyx fracture, admitted from [place] 6/22 with dyspnea, cough. CXR 6/24 demonstrated nearly complete opacification of left hemithorax. Subsequent CT showed large pleural effusion and pt had thoracentesis on 6/25 with 390 ml fluid removed. There were multiple loculations concerning for empyema. CTS consulted and was not considered VATS candidate. Chest tube was placed 6/27 through 7/1. Extubated 6/29. On [place] service from 7/2.  Assessment & Plan:   Principal Problem: Acute respiratory failure with hypoxia / left side empyema / acute bronchitis / Possible LLL pneumonia / Leukocytosis  - Possible pneumonia seen on CXR 7/1 - Per Dr. Cyndia Bent of CTS, pt not VATS candidate - Transitioned to vantin 7/2 - Chest tube removed 7/1 - Continue current bronchodilators - Continue oxygen support via [place] to keep O2 sats above 90%  Active Problems: ETOH abuse - Precedex weaned off - Continue folate and thiamine  - Continue multivitamin   Anemia of chronic disease - Due to bone marrow suppression from alcohol abuse  - Hgb stable  Essential hypertension - Hydralazine PRN  Depression - Continue Prozac   Severe protein-calorie malnutrition ([place]) - In the context of chronic illness - Diet as tolerated   Fall - SNF on discharge    DVT prophylaxis: Heparin subQ Code Status: full code  Family Communication: no family at the bedside this am Disposition Plan: home in am if oxygen sat above 90. She currently requires oxygen support to keep O2 sats above 90   Consultants:   PCCM primary until 7/2  - IR  Procedures:   ETT 6/26 - 6/29  Left pigtail CT 6/27 - 7/1  Antimicrobials:   Azithro 6/22 - 6/29  Rocephin 6/22 -  6/26  Cefepime 6/26 - 7/2  Cefpodoxime 7/2 -->   Subjective: Pt still feels weak. Needs oxygen to keep O2 sats above 90%.  Objective: Vitals:   02/18/17 2051 02/18/17 2300 02/19/17 0503 02/19/17 0738  BP:   138/87   Pulse:   92   Resp:   18   Temp:   98.6 F (37 C)   TempSrc:   Oral   SpO2: 98%  99% (!) 86%  Weight:  50.8 kg (112 lb)    Height:        Intake/Output Summary (Last 24 hours) at 02/19/17 1035 Last data filed at 02/19/17 0852  Gross per 24 hour  Intake              640 ml  Output             1100 ml  Net             -460 ml   Filed Weights   02/17/17 0617 02/18/17 0700 02/18/17 2300  Weight: 52.2 kg (115 lb 1.3 oz) 52.2 kg (115 lb) 50.8 kg (112 lb)    Examination:  General exam: Appears calm and comfortable  Respiratory system: Diminished breath sounds, wheezing in upper lung lobes  Cardiovascular system: S1 & S2 heard, RRR.  Gastrointestinal system: Abdomen is nondistended, soft and nontender. No organomegaly or masses felt. Normal bowel sounds heard. Central nervous system: Alert and oriented. No focal neurological deficits. Extremities: Symmetric 5 x 5 power. Skin: No  rashes, lesions or ulcers Psychiatry: Judgement and insight appear normal. Mood & affect appropriate.   Data Reviewed: I have personally reviewed following labs and imaging studies  CBC:  Recent Labs Lab 02/14/17 0309 02/15/17 0400 02/16/17 0739 02/17/17 0417 02/18/17 0844  WBC 17.1* 13.0* 14.3* 14.6* 18.3*  HGB 9.0* 8.8* 10.2* 9.8* 10.1*  HCT 27.6* 26.7* 30.8* 29.2* 31.0*  MCV 94.2 96.0 95.4 94.5 96.3  PLT 479* 590* 771* 471* 409*   Basic Metabolic Panel:  Recent Labs Lab 02/12/17 1710  02/13/17 0324 02/13/17 1726 02/14/17 0309 02/15/17 0400 02/16/17 0739 02/17/17 0417 02/18/17 0844  NA  --   < > 135  --  135 135 135 136 135  K  --   < > 4.0  --  3.5 3.5 3.4* 3.5 3.6  CL  --   < > 100*  --  101 98* 95* 98* 95*  CO2  --   < > 28  --  28 31 30  32 33*  GLUCOSE   --   < > 161*  --  141* 109* 99 97 149*  BUN  --   < > 32*  --  36* 27* 24* 21* 21*  CREATININE  --   < > 0.47  --  0.38* 0.31* 0.38* 0.45 0.49  CALCIUM  --   < > 8.4*  --  8.1* 8.5* 9.0 8.8* 8.9  MG 2.1  --  1.8 1.7  --  2.0 2.0 2.0  --   PHOS 3.1  --  3.0 2.2*  --   --  3.1 2.5  --   < > = values in this interval not displayed. GFR: Estimated Creatinine Clearance: 42.3 mL/min (by C-G formula based on SCr of 0.49 mg/dL). Liver Function Tests: No results for input(s): AST, ALT, ALKPHOS, BILITOT, PROT, ALBUMIN in the last 168 hours. No results for input(s): LIPASE, AMYLASE in the last 168 hours. No results for input(s): AMMONIA in the last 168 hours. Coagulation Profile: No results for input(s): INR, PROTIME in the last 168 hours. Cardiac Enzymes: No results for input(s): CKTOTAL, CKMB, CKMBINDEX, TROPONINI in the last 168 hours. BNP (last 3 results) No results for input(s): PROBNP in the last 8760 hours. HbA1C: No results for input(s): HGBA1C in the last 72 hours. CBG:  Recent Labs Lab 02/18/17 0723 02/18/17 1204 02/18/17 1715 02/18/17 2037 02/19/17 0701  GLUCAP 113* 115* 107* 122* 113*   Lipid Profile: No results for input(s): CHOL, HDL, LDLCALC, TRIG, CHOLHDL, LDLDIRECT in the last 72 hours. Thyroid Function Tests: No results for input(s): TSH, T4TOTAL, FREET4, T3FREE, THYROIDAB in the last 72 hours. Anemia Panel: No results for input(s): VITAMINB12, FOLATE, FERRITIN, TIBC, IRON, RETICCTPCT in the last 72 hours. Urine analysis:    Component Value Date/Time   COLORURINE AMBER (A) 02/07/2017 1159   APPEARANCEUR HAZY (A) 02/07/2017 1159   LABSPEC 1.020 02/07/2017 1159   PHURINE 5.0 02/07/2017 1159   GLUCOSEU NEGATIVE 02/07/2017 1159   HGBUR MODERATE (A) 02/07/2017 1159   BILIRUBINUR NEGATIVE 02/07/2017 1159   BILIRUBINUR neg 01/23/2012 1445   KETONESUR 5 (A) 02/07/2017 1159   PROTEINUR 30 (A) 02/07/2017 1159   UROBILINOGEN 0.2 01/23/2012 1445   NITRITE NEGATIVE  02/07/2017 1159   LEUKOCYTESUR NEGATIVE 02/07/2017 1159   Sepsis Labs: @LABRCNTIP (procalcitonin:4,lacticidven:4)   Recent Results (from the past 240 hour(s))  Body fluid culture     Status: None   Collection Time: 02/10/17  1:09 PM  Result Value Ref Range Status  Specimen Description PLEURAL LEFT  Final   Special Requests PLEURAL LEFT  Final   Gram Stain   Final    FEW WBC PRESENT, PREDOMINANTLY PMN NO ORGANISMS SEEN    Culture   Final    NO GROWTH 3 DAYS Performed at [facility], [address] [place], [place] [address]    Report Status 02/14/2017 FINAL  Final  MRSA PCR Screening     Status: None   Collection Time: 02/11/17  1:20 AM  Result Value Ref Range Status   MRSA by PCR NEGATIVE NEGATIVE Final    Comment:        The GeneXpert MRSA Assay (FDA approved for NASAL specimens only), is one component of a comprehensive MRSA colonization surveillance program. It is not intended to diagnose MRSA infection nor to guide or monitor treatment for MRSA infections.   Culture, body fluid-bottle     Status: None   Collection Time: 02/12/17  9:25 AM  Result Value Ref Range Status   Specimen Description PLEURAL LEFT  Final   Special Requests NONE  Final   Culture   Final    NO GROWTH 5 DAYS Performed at [facility], [address], [place], [place] [address]    Report Status 02/17/2017 FINAL  Final  Gram stain     Status: None   Collection Time: 02/12/17  9:25 AM  Result Value Ref Range Status   Specimen Description PLEURAL LEFT  Final   Special Requests   Final    NONE Performed at [facility], [place] [address], [place], [place] [address]    Gram Stain   Final    FEW WBC PRESENT,BOTH PMN AND MONONUCLEAR NO ORGANISMS SEEN    Report Status 02/12/2017 FINAL  Final      Radiology Studies: Dg Chest Port 1 View  Result Date: 02/17/2017 CLINICAL DATA:  Respiratory failure . EXAM: PORTABLE CHEST 1 VIEW COMPARISON:  02/16/2017. FINDINGS:  Cardiomegaly with pulmonary vascular congestion and bilateral interstitial prominence consistent CHF. Left lower lobe atelectasis and consolidation. Bilateral small pleural effusions, left side greater right. No pneumothorax. Neurostimulator stable position. Surgical clips right axilla scratched it surgical clips noted over the chest. IMPRESSION: 1. Congestive heart failure with pulmonary interstitial edema and small bilateral pleural effusions left side greater than right. 2. Left lower lobe atelectasis and consolidation. Left lower lobe pneumonia cannot be excluded. Electronically Signed   By: Marcello Moores  Register   On: 02/17/2017 06:57   Dg Chest Port 1 View  Result Date: 02/16/2017 CLINICAL DATA:  Left chest tube removal. EXAM: PORTABLE CHEST 1 VIEW COMPARISON:  Earlier same day FINDINGS: Left chest tube is been removed. No pneumothorax. Pleural and parenchymal density at the left base appears unchanged. IMPRESSION: No pneumothorax following left chest tube removal. Electronically Signed   By: Nelson Chimes M.D.   On: 02/16/2017 11:44   Dg Chest Port 1 View  Result Date: 02/16/2017 CLINICAL DATA:  Acute respiratory failure EXAM: PORTABLE CHEST 1 VIEW COMPARISON:  02/15/2017 FINDINGS: Left-sided chest tube remains in place. No change in small moderate residual left pleural effusion and dense consolidation in the lingula and left base. Stable slightly enlarged cardiomediastinal silhouette with central vascular congestion and mild pulmonary edema. Aortic atherosclerosis. No pneumothorax. IMPRESSION: 1. No significant interval change in left pleural effusion, left basilar consolidation. 2. Continued mild cardiomegaly with central vascular congestion and mild pulmonary edema, not significantly changed. Electronically Signed   By: Madie Reno.D.  On: 02/16/2017 03:32        Scheduled Meds: . cefpodoxime  200 mg Oral Q12H  . famotidine  20 mg Oral BID  . feeding supplement (ENSURE ENLIVE)  237 mL Oral  BID BM  . FLUoxetine  20 mg Oral Daily  . folic acid  1 mg Oral Daily  . guaiFENesin  20 mL Oral BID  . heparin subcutaneous  5,000 Units Subcutaneous Q8H  . ipratropium-albuterol  3 mL Nebulization TID  . multivitamin with minerals  1 tablet Oral Daily  . polyethylene glycol  17 g Oral Daily  . thiamine  100 mg Oral Daily   Continuous Infusions: . sodium chloride 10 mL/hr at 02/16/17 0700     LOS: 11 days    Time spent: 25 minutes  Greater than 50% of the time spent on counseling and coordinating the care.   Leisa Lenz, MD [facility] Pager [phone]  If 7PM-7AM, please contact night-coverage [email or web address] Password [email or web address] 02/19/2017, 10:35 AM

## 2017-02-19 NOTE — Progress Notes (Signed)
  Speech Language Pathology Treatment: Dysphagia  Patient Details Name: Yolanda [name] MRN: [id number] DOB: [birth year] Today's Date: 02/19/2017 Time: 7867-6720 SLP Time Calculation (min) (ACUTE ONLY): 9 min  Assessment / Plan / Recommendation Clinical Impression  Pt seen to assess tolerance of po diet and to educate to general precautions.  Son present during session and provided with written instructions re: heimlich maneuver.   Intake listed as 50%, voice is strong and clear.  Note WBC is 18.3 and pt with slight temperature/fever.  RN reports pt had some desaturation today = appears comfortable on nasal cannula oxygen at this time.  Son admits pt with much improved mentation since earlier in hospital stay while in ICU.  Pt adamantly denies dysphagia symptoms/choking while eating and states she is to discharge tomorrow.   Last CXR 02/17/17 reports:  Left lower lobe atelectasis and consolidation. Left lower lobe pneumonia cannot be excluded.  Congestive heart failure with pulmonary interstitial edema and small bilateral pleural effusions left side greater than right.   Recommend continue diet as tolerated.  All education completed and suspect resolution of acute dysphagia s/p intubation.     HPI HPI: Pt is an 83 y.o. female with PMH of remote CVA and alcohol abuse, drinks 5-6 glasses of wine per day. Patient brought to the hospital 6/22 because of failure to thrive. Her story started about 5 weeks ago when she fell and bruised her tailbone, since then she was unable to ambulate very well, reported spending most of the time in the bed, had less appetite and oral intake. For the past several days PTA she started to have some cough which is minimally productive, she denied fever and palpitation. Chest CT 6/25 showed large L pleural effusion with diffuse atelectasis of L lung, possible mucus plugging. Pt was intubated 6/26 to 6/29. CXR 6/30 showed persistent LLL collapse. Bedside swallow eval ordered.        SLP Plan  All goals met       Recommendations  Diet recommendations: Regular;Thin liquid Liquids provided via: Cup;Straw Medication Administration: Whole meds with liquid Supervision: Patient able to self feed Compensations: Slow rate;Small sips/bites Postural Changes and/or Swallow Maneuvers: Upright 30-60 min after meal;Seated upright 90 degrees                Oral Care Recommendations: Oral care BID Follow up Recommendations: Other (comment) (TBD) SLP Visit Diagnosis: Dysphagia, unspecified (R13.10) Plan: All goals met       GO              Luanna Salk, MS [facility] SLP [phone]   Macario Golds 02/19/2017, 3:18 PM

## 2017-02-20 LAB — BASIC METABOLIC PANEL
Anion gap: 9 (ref 5–15)
BUN: 11 mg/dL (ref 6–20)
CHLORIDE: 97 mmol/L — AB (ref 101–111)
CO2: 30 mmol/L (ref 22–32)
CREATININE: 0.41 mg/dL — AB (ref 0.44–1.00)
Calcium: 8.6 mg/dL — ABNORMAL LOW (ref 8.9–10.3)
GFR calc Af Amer: >60 mL/min (ref >60)
GFR calc non Af Amer: >60 mL/min (ref >60)
GLUCOSE: 100 mg/dL — AB (ref 65–99)
POTASSIUM: 3.5 mmol/L (ref 3.5–5.1)
SODIUM: 136 mmol/L (ref 135–145)

## 2017-02-20 LAB — GLUCOSE, CAPILLARY
GLUCOSE-CAPILLARY: 104 mg/dL — AB (ref 65–99)
GLUCOSE-CAPILLARY: 121 mg/dL — AB (ref 65–99)
GLUCOSE-CAPILLARY: 91 mg/dL (ref 65–99)
Glucose-Capillary: 99 mg/dL (ref 65–99)

## 2017-02-20 LAB — CBC
HEMATOCRIT: 26.5 % — AB (ref 36.0–46.0)
HEMOGLOBIN: 8.9 g/dL — AB (ref 12.0–15.0)
MCH: 32.1 pg (ref 26.0–34.0)
MCHC: 33.6 g/dL (ref 30.0–36.0)
MCV: 95.7 fL (ref 78.0–100.0)
Platelets: 741 10*3/uL — ABNORMAL HIGH (ref 150–400)
RBC: 2.77 MIL/uL — ABNORMAL LOW (ref 3.87–5.11)
RDW: 14.5 % (ref 11.5–15.5)
WBC: 16.8 10*3/uL — ABNORMAL HIGH (ref 4.0–10.5)

## 2017-02-20 MED ORDER — OXYCODONE HCL 10 MG PO TABS: 10.0000 mg | ORAL_TABLET | Freq: Four times a day (QID) | ORAL | 0 refills | Status: DC | PRN

## 2017-02-20 MED ORDER — CEFPODOXIME PROXETIL 200 MG PO TABS: 200.0000 mg | ORAL_TABLET | Freq: Two times a day (BID) | ORAL | 0 refills | Status: DC

## 2017-02-20 MED ORDER — GUAIFENESIN 100 MG/5ML PO SOLN: 20.0000 mL | Freq: Two times a day (BID) | ORAL | 0 refills | Status: DC

## 2017-02-20 MED ORDER — THIAMINE HCL 100 MG PO TABS: 100.0000 mg | ORAL_TABLET | Freq: Every day | ORAL | 0 refills | Status: AC

## 2017-02-20 MED ORDER — ADULT MULTIVITAMIN W/MINERALS CH: 1.0000 | ORAL_TABLET | Freq: Every day | ORAL | 0 refills | Status: AC

## 2017-02-20 MED ORDER — FOLIC ACID 1 MG PO TABS: 1.0000 mg | ORAL_TABLET | Freq: Every day | ORAL | 0 refills | Status: AC

## 2017-02-20 MED ORDER — FAMOTIDINE 20 MG PO TABS: 20.0000 mg | ORAL_TABLET | Freq: Two times a day (BID) | ORAL | 0 refills | Status: AC

## 2017-02-20 MED ORDER — TRAZODONE HCL 50 MG PO TABS: 50.0000 mg | ORAL_TABLET | Freq: Every day | ORAL | 0 refills | Status: DC

## 2017-02-20 MED ORDER — DOCUSATE SODIUM 50 MG/5ML PO LIQD: 100.0000 mg | Freq: Two times a day (BID) | ORAL | 0 refills | Status: DC | PRN

## 2017-02-20 MED ORDER — ENSURE ENLIVE PO LIQD: 237.0000 mL | Freq: Two times a day (BID) | ORAL | 12 refills | Status: AC

## 2017-02-20 MED ORDER — ALBUTEROL SULFATE (2.5 MG/3ML) 0.083% IN NEBU: 2.5000 mg | INHALATION_SOLUTION | RESPIRATORY_TRACT | 0 refills | Status: DC | PRN

## 2017-02-20 MED ORDER — IPRATROPIUM-ALBUTEROL 0.5-2.5 (3) MG/3ML IN SOLN: 3.0000 mL | Freq: Three times a day (TID) | RESPIRATORY_TRACT | 0 refills | Status: DC

## 2017-02-20 MED ORDER — ACETAMINOPHEN 325 MG PO TABS: 650.0000 mg | ORAL_TABLET | Freq: Four times a day (QID) | ORAL | 0 refills | Status: DC | PRN

## 2017-02-20 NOTE — Discharge Instructions (Signed)
Cefpodoxime tablets What is this medicine? CEFPODOXIME (sef pode OX eem) is a cephalosporin antibiotic. It is used to treat certain kinds of bacterial infections. It will not work for colds, flu, or other viral infections. This medicine may be used for other purposes; ask your health care provider or pharmacist if you have questions. COMMON BRAND NAME(S): Vantin What should I tell my health care provider before I take this medicine? They need to know if you have any of these conditions: -bleeding problems -bowel disease, like colitis -kidney disease -other chronic illness -an unusual or allergic reaction to cefpodoxime, other cephalosporin antibiotics, penicillin, penicillamine, other foods, dyes or preservatives -pregnant or trying to get pregnant -breast-feeding How should I use this medicine? Take this medicine by mouth with a full glass of water. Follow the directions on the prescription label. Take with food. Take your medicine at regular intervals. Do not take your medicine more often than directed. Take all of your medicine as directed even if you think you are better. Do not skip doses or stop your medicine early. Talk to your pediatrician regarding the use of this medicine in children. Special care may be needed. Overdosage: If you think you have taken too much of this medicine contact a poison control center or emergency room at once. NOTE: This medicine is only for you. Do not share this medicine with others. What if I miss a dose? If you miss a dose, take it as soon as you can. If it is almost time for your next dose, take only that dose. Do not take double or extra doses. What may interact with this medicine? -antacids -diuretics -medicines for stomach acid or ulcer like cimetidine, famotidine, lansoprazole, nizatidine, omeprazole, ranitidine -probenecid -sodium bicarbonate This list may not describe all possible interactions. Give your health care provider a list of all the  medicines, herbs, non-prescription drugs, or dietary supplements you use. Also tell them if you smoke, drink alcohol, or use illegal drugs. Some items may interact with your medicine. What should I watch for while using this medicine? Tell your doctor or health care professional if your symptoms do not improve or if you get new symptoms. Do not treat diarrhea with over the counter products. Contact your doctor if you have diarrhea that lasts more than 2 days or if it is severe and watery. What side effects may I notice from receiving this medicine? Side effects that you should report to your doctor or health care professional as soon as possible: -allergic reactions like skin rash, itching or hives, swelling of the face, lips, or tongue -breathing problems -confused, nervous, shaky -fast, irregular heartbeat -redness, blistering, peeling or loosening of the skin, including inside the mouth -unusually weak or tired -vaginal irritation, discharge Side effects that usually do not require medical attention (report to your doctor or health care professional if they continue or are bothersome): -diarrhea -dizzy, drowsy -dry mouth -headache -joint or muscle pain -stomach upset, gas -trouble sleeping -vomiting This list may not describe all possible side effects. Call your doctor for medical advice about side effects. You may report side effects to FDA at 1-800-FDA-1088. Where should I keep my medicine? Keep out of the reach of children. Store at room temperature between 20 and 25 degrees C (68 and 77 degrees F). Keep container closed tightly. Throw away any unused medicine after the expiration date. NOTE: This sheet is a summary. It may not cover all possible information. If you have questions about this medicine, talk to   your doctor, pharmacist, or health care provider.  2018 [employer] Standard (2007-11-10 13:24:38)  

## 2017-02-20 NOTE — Progress Notes (Signed)
Pt is ready to dc to [facility] at [place]. Pt / son are in agreement with dc plan. [employer] medicare authorization is pending. Pt reports that she will pay out of pocket at SNF while awaiting authorization. [facility] transport is required. Medical necessity form completed. Pt is aware out of pocket costs may be associated with [facility] transport. SNF has reviewed d/c summary. Scripts included in [employer]. # for report provided to nsg.  Werner Lean LCSW [phone]

## 2017-02-20 NOTE — Discharge Summary (Signed)
Physician Discharge Summary  Yolanda [name] DGL:[id number] DOB: Dec 20, 1933 DOA: 02/07/2017  PCP: Javier Glazier, MD  Admit date: 02/07/2017 Discharge date: 02/20/2017  Recommendations for Outpatient Follow-up:  1. Continue Vantin for 5 days on discharge 2. Check CBC in 2-3 days in SNF, specifically hemoglobin. Hgb on discharge 8.9  Discharge Diagnoses:  Principal Problem:   Acute bronchitis Active Problems:   Non-compliant behavior   ETOH abuse   Fall   Coccyx pain   Leukocytosis   Severe protein-calorie malnutrition (HCC)   Hypoxia   Pleural effusion on left   Acute respiratory failure with hypoxemia (HCC)   Empyema lung (HCC)    Discharge Condition: stable   Diet recommendation: as tolerated   History of present illness:  83 year old female with history of EtOH abuse, recent fall and coccyx fracture, admitted from [facility] 6/22 with dyspnea, cough. CXR 6/24 demonstrated nearly complete opacification of left hemithorax. Subsequent CT showed large pleural effusion and pt had thoracentesis on 6/25 with 390 ml fluid removed. There were multiple loculations concerning for empyema. CTS consulted and was not considered VATS candidate. Chest tube was placed 6/27 through 7/1. Extubated 6/29. On Pump Back service from 7/2.   Hospital Course:   Principal Problem: Acute respiratory failure with hypoxia / left side empyema / acute bronchitis / Possible LLL pneumonia / Leukocytosis  - Per Dr. Cyndia Bent of CTS pt not VATS candidate  - Continue vantin for 5 days on discharge - Pt had chest tube, removed 7/1 - Stable resp status - Continue current bronchodilator treatment   Active Problems: ETOH abuse - Was on precedex - Continue thiamin, folic acid, multivitamin  Anemia of chronic disease - Due to bone marrow suppression from alcohol abuse - Hgb is little down, 8.9, was 10.1 on 7/3 - Likely dilutional, no evidence of gross bleed - Follow up CBC in SNF in about 2-3 days   Essential  hypertension - BP well controlled without scheduled BP meds  Depression - Continue prozac  Severe protein-calorie malnutrition ([place]) - In the context of chronic illness - Diet as tolerated   Fall - SNF on d/c   DVT prophylaxis: hep subQ Code Status: full code  Family Communication: pt son at the bedside     Consultants:   PCCM  IR  Procedures:   ETT 6/26 - 6/29  Left pigtail CT 6/27 - 7/1  Antimicrobials:   Azithro 6/22 - 6/29  Rocephin 6/22 - 6/26  Cefepime 6/26 - 7/2  Cefpodoxime 7/2 --> for 5 days on discharge  Signed:  Leisa Lenz, MD  [facility] 02/20/2017, 9:04 AM  Pager #: [phone]  Time spent in minutes: more than 30 minutes    Discharge Exam: Vitals:   02/20/17 0515 02/20/17 0831  BP: 137/76   Pulse: 77 91  Resp: 18 18  Temp: 98.8 F (37.1 C)    Vitals:   02/20/17 0515 02/20/17 0829 02/20/17 0831 02/20/17 0833  BP: 137/76     Pulse: 77  91   Resp: 18  18   Temp: 98.8 F (37.1 C)     TempSrc: Oral     SpO2: 98% 92% 92% 92%  Weight:      Height:        General: Pt is alert, follows commands appropriately, not in acute distress Cardiovascular: Regular rate and rhythm, S1/S2 + Respiratory: Clear to auscultation bilaterally, no wheezing, no crackles, no rhonchi Abdominal: Soft, non tender, non distended, bowel sounds +, no guarding  Extremities: no edema, no cyanosis, pulses palpable bilaterally DP and PT Neuro: Grossly nonfocal  Discharge Instructions  Discharge Instructions    Call MD for:  persistant nausea and vomiting    Complete by:  As directed    Call MD for:  redness, tenderness, or signs of infection (pain, swelling, redness, odor or green/yellow discharge around incision site)    Complete by:  As directed    Call MD for:  severe uncontrolled pain    Complete by:  As directed    Diet - low sodium heart healthy    Complete by:  As directed    Discharge instructions    Complete by:  As  directed    Continue Vantin for 5 days on discharge.   Increase activity slowly    Complete by:  As directed      Allergies as of 02/20/2017   No Known Allergies     Medication List    TAKE these medications   acetaminophen 325 MG tablet Commonly known as:  TYLENOL Take 2 tablets (650 mg total) by mouth every 6 (six) hours as needed for mild pain (or Fever >/= 101).   albuterol (2.5 MG/3ML) 0.083% nebulizer solution Commonly known as:  PROVENTIL Take 3 mLs (2.5 mg total) by nebulization every 3 (three) hours as needed for wheezing or shortness of breath.   cefpodoxime 200 MG tablet Commonly known as:  VANTIN Take 1 tablet (200 mg total) by mouth every 12 (twelve) hours.   docusate 50 MG/5ML liquid Commonly known as:  COLACE Take 10 mLs (100 mg total) by mouth 2 (two) times daily as needed for mild constipation.   famotidine 20 MG tablet Commonly known as:  PEPCID Take 1 tablet (20 mg total) by mouth 2 (two) times daily.   feeding supplement (ENSURE ENLIVE) Liqd Take 237 mLs by mouth 2 (two) times daily between meals.   FLUoxetine 20 MG capsule Commonly known as:  PROZAC Take 20 mg by mouth daily.   folic acid 1 MG tablet Commonly known as:  FOLVITE Take 1 tablet (1 mg total) by mouth daily.   guaiFENesin 100 MG/5ML Soln Commonly known as:  ROBITUSSIN Take 20 mLs (400 mg total) by mouth 2 (two) times daily.   ipratropium-albuterol 0.5-2.5 (3) MG/3ML Soln Commonly known as:  DUONEB Take 3 mLs by nebulization 3 (three) times daily.   MOVANTIK 25 MG Tabs tablet Generic drug:  naloxegol oxalate Take by mouth daily.   multivitamin with minerals Tabs tablet Take 1 tablet by mouth daily.   Oxycodone HCl 10 MG Tabs Take 1 tablet (10 mg total) by mouth every 6 (six) hours as needed.   thiamine 100 MG tablet Take 1 tablet (100 mg total) by mouth daily.   traZODone 50 MG tablet Commonly known as:  DESYREL Take 1 tablet (50 mg total) by mouth at bedtime.        Contact information for follow-up providers    Javier Glazier, MD. Schedule an appointment as soon as possible for a visit in 2 week(s).   Specialty:  Internal Medicine           Contact information for after-discharge care    Destination    [facility] Follow up.   Specialty:  [place] information: [address] [place] [phone]                   The results of significant diagnostics  from this hospitalization (including imaging, microbiology, ancillary and laboratory) are listed below for reference.    Significant Diagnostic Studies: Dg Chest 1 View  Result Date: 02/10/2017 CLINICAL DATA:  Left thoracentesis. EXAM: CHEST 1 VIEW COMPARISON:  02/09/2017 FINDINGS: Slightly improved aeration in left lung compared to the previous examination. There continues to be a large amount of pleural-parenchymal disease in the left chest. There is concern for new airspace densities in the right upper lung compared to the recent comparison examination. Patient is rotated towards the right. Evidence for a spinal stimulator. Again noted are surgical clips in the bilateral axillary regions. Severe degenerative changes in the left glenohumeral joint. IMPRESSION: Slightly improved aeration in the left lung following the left thoracentesis. Negative for a large pneumothorax. There continues to be a large amount of pleural-based disease in the left chest. Concern for increased airspace disease in the right upper lung. Electronically Signed   By: Markus Daft M.D.   On: 02/10/2017 13:45   Dg Chest 1 View  Result Date: 02/07/2017 CLINICAL DATA:  Leukocytosis, failure to thrive EXAM: CHEST 1 VIEW COMPARISON:  CT left shoulder 03/08/2013 FINDINGS: There is mild bilateral interstitial prominence. There is no focal consolidation. There is no pleural effusion or pneumothorax. The heart and mediastinal contours are unremarkable.  There is severe osteoarthritis of the left glenohumeral joint. There is a thoracic spinal stimulator noted. IMPRESSION: No active disease. Electronically Signed   By: Kathreen Devoid   On: 02/07/2017 13:54   Ct Chest Wo Contrast  Result Date: 02/14/2017 CLINICAL DATA:  Pleural effusion, status post pigtail drainage EXAM: CT CHEST WITHOUT CONTRAST TECHNIQUE: Multidetector CT imaging of the chest was performed following the standard protocol without IV contrast. COMPARISON:  Radiograph 02/13/2017, CT 02/10/2017 FINDINGS: Cardiovascular: Limited evaluation without intravenous contrast. Ectatic ascending aorta and arch with calcifications. Coronary artery calcification. Cardiomegaly. Small pericardial effusion. Mediastinum/Nodes: Stable mediastinal lymph nodes, precarinal lymph node measures 12 mm compared with 13 mm previously. Limited evaluation for hilar nodes without contrast. Small hypodense thyroid nodules. Endotracheal tube tip terminates in the distal trachea, about 2 cm superior to the carina. Esophageal tube looped in the stomach. Lungs/Pleura: Moderate emphysema. Interim placement of left-sided pigtail drainage catheter with tip positioned in the posterior pleural space. Significant decrease in left-sided pleural effusion. Small to moderate residual pleural effusion is noted. Increased right pleural effusion, small to moderate. Improved aeration of the left upper and lower lobes. Residual compressive atelectasis at the left base. Upper Abdomen: No acute abnormality. Stable hypodense nodule right adrenal gland probably an adenoma. Musculoskeletal: Thoracic stimulator at T8. Chronic compression of T11 and T12. Post mastectomy changes. IMPRESSION: 1. Interim placement of left-sided pigtail drainage catheter with significant decrease in left-sided pleural effusion. Small to moderate residual pleural effusion is noted. There is improved aeration of the left upper and lower lobes with clearing of previously noted  material in the left bronchi. Passive atelectasis within the left lower lobe 2. Increased right pleural effusion now small to moderate 3. Moderate emphysema 4. Stable mediastinal adenopathy 5. Cardiomegaly with small amount of pericardial effusion Aortic Atherosclerosis (ICD10-I70.0) and Emphysema (ICD10-J43.9). Electronically Signed   By: Donavan Foil M.D.   On: 02/14/2017 00:54   Ct Chest Wo Contrast  Result Date: 02/10/2017 CLINICAL DATA:  Left-sided chest pain with shortness of breath and hypoxia EXAM: CT CHEST WITHOUT CONTRAST TECHNIQUE: Multidetector CT imaging of the chest was performed following the standard protocol without IV contrast. COMPARISON:  Chest x-ray 02/09/2017, 02/07/2017 FINDINGS:  Cardiovascular: Limited evaluation without intravenous contrast. Aortic atherosclerosis. No aneurysmal dilatation. Coronary artery calcification. Mild cardiomegaly. Small pericardial effusion at the cardiac apex. Mediastinum/Nodes: Esophagus within normal limits. Mild mediastinal nodes, largest seen in the precarinal space measuring up to 13 mm. No thyroid mass. Midline trachea. Hypodense material within left lower lobe bronchus. Lungs/Pleura: Large left-sided pleural effusion with diffuse atelectasis of left lung and air bronchograms. Mild shift of mediastinal contents to the right. Moderate emphysema in the right upper lobe. Small right pleural effusion Upper Abdomen: Diffuse nodular enlargement of the left adrenal gland. 13 mm low-density nodule in the right adrenal gland consistent with adenoma. Musculoskeletal: Advanced degenerative changes of the left shoulder. Mild compression deformities of T11 and T12. Thoracic stimulator device posterior to T8. Post mastectomy changes IMPRESSION: 1. Large left pleural effusion with diffuse atelectasis of the left lung. Unable to exclude mass given diffuse consolidation. Hypodense material within the left lower lobe bronchus, could reflect mucus plugging. 2. Mild  mediastinal adenopathy, possibly reactive. 3. Cardiomegaly with small pericardial effusion 4. Moderate emphysema Aortic Atherosclerosis (ICD10-I70.0) and Emphysema (ICD10-J43.9). Electronically Signed   By: Donavan Foil M.D.   On: 02/10/2017 03:11   Ct Pelvis Wo Contrast  Result Date: 02/07/2017 CLINICAL DATA:  83 y.o. female with medical history significant of remote CVA and alcohol abuse, drinks 5-6 glasses of wine per day. Patient brought to the hospital by her 2 kids because of failure to thrive. Her story started about 5 weeks ago when she fell and bruised her tailbone, since then she was unable to ambulate very well, reported spending most of the time in the bed, had less appetite and oral intake. For the past several days she started to have some cough which is minimally productive, she denied fever and palpitation. EXAM: CT PELVIS WITHOUT CONTRAST TECHNIQUE: Multidetector CT imaging of the pelvis was performed following the standard protocol without intravenous contrast. COMPARISON:  None. FINDINGS: Urinary Tract:  No ureteral dilation.  Bladder is unremarkable. Bowel: No bowel dilation to suggest obstruction. No bowel wall thickening or inflammation. Vascular/Lymphatic: Atherosclerotic calcifications noted along the iliac vessels and lower abdominal aorta. No aneurysm. No adenopathy. Reproductive: Multiple uterine fibroids, several of dense calcification. No adnexal masses. Other:  No pelvic free fluid. Musculoskeletal: There is a mildly comminuted nondisplaced fracture of the base of the coccyx. No other fractures. Both hips show symmetric arthropathic changes with concentric joint space narrowing and marginal osteophytes from the bases of the femoral heads. There are degenerative changes of the visible lower lumbar spine. Bones are diffusely demineralized. IMPRESSION: 1. Comminuted nondisplaced fracture of the base of the coccyx. 2. No other fractures.  No other acute abnormalities. Electronically  Signed   By: Lajean Manes M.D.   On: 02/07/2017 21:34   Dg Chest Port 1 View  Result Date: 02/17/2017 CLINICAL DATA:  Respiratory failure . EXAM: PORTABLE CHEST 1 VIEW COMPARISON:  02/16/2017. FINDINGS: Cardiomegaly with pulmonary vascular congestion and bilateral interstitial prominence consistent CHF. Left lower lobe atelectasis and consolidation. Bilateral small pleural effusions, left side greater right. No pneumothorax. Neurostimulator stable position. Surgical clips right axilla scratched it surgical clips noted over the chest. IMPRESSION: 1. Congestive heart failure with pulmonary interstitial edema and small bilateral pleural effusions left side greater than right. 2. Left lower lobe atelectasis and consolidation. Left lower lobe pneumonia cannot be excluded. Electronically Signed   By: Marcello Moores  Register   On: 02/17/2017 06:57   Dg Chest Port 1 View  Result Date: 02/16/2017 CLINICAL  DATA:  Left chest tube removal. EXAM: PORTABLE CHEST 1 VIEW COMPARISON:  Earlier same day FINDINGS: Left chest tube is been removed. No pneumothorax. Pleural and parenchymal density at the left base appears unchanged. IMPRESSION: No pneumothorax following left chest tube removal. Electronically Signed   By: Nelson Chimes M.D.   On: 02/16/2017 11:44   Dg Chest Port 1 View  Result Date: 02/16/2017 CLINICAL DATA:  Acute respiratory failure EXAM: PORTABLE CHEST 1 VIEW COMPARISON:  02/15/2017 FINDINGS: Left-sided chest tube remains in place. No change in small moderate residual left pleural effusion and dense consolidation in the lingula and left base. Stable slightly enlarged cardiomediastinal silhouette with central vascular congestion and mild pulmonary edema. Aortic atherosclerosis. No pneumothorax. IMPRESSION: 1. No significant interval change in left pleural effusion, left basilar consolidation. 2. Continued mild cardiomegaly with central vascular congestion and mild pulmonary edema, not significantly changed.  Electronically Signed   By: Donavan Foil M.D.   On: 02/16/2017 03:32   Dg Chest Port 1 View  Result Date: 02/15/2017 CLINICAL DATA:  Respiratory failure.  Extubated. EXAM: PORTABLE CHEST 1 VIEW COMPARISON:  02/14/2017 FINDINGS: Endotracheal tube and nasogastric tube have been removed. Left chest tube remains in place. Persistent volume loss in the left lower lobe. Small amount of pleural air loculated at the left base, unchanged. IMPRESSION: Endotracheal tube and nasogastric tube removal. Persistent left chest tube. Persistent left lower lobe collapse. Persistent small amount of pleural air at the left base. Electronically Signed   By: Nelson Chimes M.D.   On: 02/15/2017 07:20   Dg Chest Port 1 View  Result Date: 02/14/2017 CLINICAL DATA:  Respiratory failure. EXAM: PORTABLE CHEST 1 VIEW COMPARISON:  CT 02/13/2017.  Chest x-ray 02/13/2017 . FINDINGS: Stable lead positioning the over the chest. Endotracheal tube and NG tube in stable position. Left chest tube in stable position. No pneumothorax. Mild left pleural thickening, no significant effusion. Mild basilar atelectasis . Stable mild interstitial prominence . Stable cardiomegaly. Surgical clips again noted over the chest. IMPRESSION: 1. Lines and tubes including left chest tube in stable position. No significant left pleural effusion. No pneumothorax. 2.  Mild basilar atelectasis. 3. Stable cardiomegaly and mild interstitial prominence Electronically Signed   By: [place]   On: 02/14/2017 06:18   Dg Chest Port 1 View  Result Date: 02/13/2017 CLINICAL DATA:  Chest tube. EXAM: PORTABLE CHEST 1 VIEW COMPARISON:  CT 02/12/2017.  Chest x-ray 02/12/2017. FINDINGS: Leads and over the chest in stable position. Endotracheal tube and NG tube in stable position. Heart size stable. Left chest tube noted in good anatomic position. Interim near complete clearing of left pleural effusion with mild residual. No pneumothorax. Left base atelectasis. Heart size  stable. Surgical clips right axilla. Scratched IMPRESSION: Left chest tube in good anatomic position. Near complete clearing of left-sided pleural effusion with mild residual. 2. Endotracheal tube and NG tube in stable position. 3.  Persistent left base atelectasis. Electronically Signed   By: Marcello Moores  Register   On: 02/13/2017 06:55   Dg Chest Port 1 View  Result Date: 02/12/2017 CLINICAL DATA:  Respiratory failure. EXAM: PORTABLE CHEST 1 VIEW COMPARISON:  02/11/2017.  02/10/2017. FINDINGS: Endotracheal tube 2.4 cm above the carina. NG tube tip below left hemidiaphragm. Overlying leads are in stable position. Heart size stable. Persistent bilateral pulmonary airspace opacities and left-sided prominent pleural effusion again noted without interim change. No pneumothorax . Surgical clips right axilla . IMPRESSION: 1. Endotracheal tube tip 2.4 cm above the carina  on today's exam. NG tube tip noted below left hemidiaphragm. 2. Persistent bilateral pulmonary airspace passes and left-sided prominent pleural effusion again noted without interim change . Electronically Signed   By: Marcello Moores  Register   On: 02/12/2017 06:46   Portable Chest Xray  Result Date: 02/11/2017 CLINICAL DATA:  Status post endotracheal tube placement. Respiratory distress. EXAM: PORTABLE CHEST 1 VIEW COMPARISON:  Chest x-ray of February 11, 2017 at 5:12 a.m. FINDINGS: The endotracheal tube tip lies 5.2 cm above the carina. There remains considerable pleural pleural fluid on the left with some residual aerated left lung. The retrocardiac region on the left remains dense. There is no mediastinal shift. There is confluent interstitial infiltrate in the right upper lobe. The esophagogastric tube tip and proximal port project below the inferior margin of the image. IMPRESSION: The endotracheal tube tip lies 5.2 cm above the carina. The esophagogastric tube tip and proximal port project below the inferior margin of the image. The infiltrates bilaterally  and the left pleural effusion are stable. These results were called by telephone at the time of interpretation on 02/11/2017 at 1:00 pm to Page, RN,, who verbally acknowledged these results. Electronically Signed   By: David  Martinique M.D.   On: 02/11/2017 13:03   Dg Chest Port 1 View  Result Date: 02/11/2017 CLINICAL DATA:  Dyspnea EXAM: PORTABLE CHEST 1 VIEW COMPARISON:  02/10/2017 FINDINGS: Large left pleural collection remains unchanged. Diffuse airspace opacities persist in both lungs. No pneumothorax. IMPRESSION: Diffuse bilateral airspace opacities and prominent left-sided pleural collection, without significant interval change. Electronically Signed   By: Andreas Newport M.D.   On: 02/11/2017 06:07   Dg Chest Port 1 View  Result Date: 02/09/2017 CLINICAL DATA:  Left lateral chest pain for 2 days. EXAM: PORTABLE CHEST 1 VIEW COMPARISON:  February 07, 2017 FINDINGS: The left hemithorax is now completely opacified consistent with a large effusion and associated atelectasis. Suggested mild edema on the right. No other acute abnormalities. IMPRESSION: Complete opacification of the left hemithorax, new in the interval. Mild edema seen in the right lung. Electronically Signed   By: Dorise Bullion III M.D   On: 02/09/2017 14:16   Dg Abd Portable 1v  Result Date: 02/11/2017 CLINICAL DATA:  Orogastric tube placement. EXAM: PORTABLE ABDOMEN - 1 VIEW COMPARISON:  Chest x-ray of today's date FINDINGS: The esophagogastric tube tip and proximal port project in distal and mid portions of the gastric body respectively. The stomach contains a moderate amount of gas. IMPRESSION: The esophagogastric tube tip in proximal port lie in the gastric body. Electronically Signed   By: David  Martinique M.D.   On: 02/11/2017 13:04   Ct Perc Pleural Drain W/indwell Cath W/img Guide  Result Date: 02/12/2017 INDICATION: Enlarging loculated left pleural effusion, currently requiring ventilatory support EXAM: RIGHT CHEST TUBE  PLACEMENT UNDER CT GUIDANCE MEDICATIONS: The patient is currently admitted to the hospital and receiving intravenous antibiotics. The antibiotics were administered within an appropriate time frame prior to the initiation of the procedure. ANESTHESIA/SEDATION: Intravenous Fentanyl and Versed were administered as conscious sedation during continuous monitoring of the patient's level of consciousness and physiological / cardiorespiratory status by the radiology RN, with a total moderate sedation time of fifteen minutes. PROCEDURE: Informed written consent was obtained from the patient after a thorough discussion of the procedural risks, benefits and alternatives. All questions were addressed. Maximal Sterile Barrier Technique was utilized including caps, mask, sterile gowns, sterile gloves, sterile drape, hand hygiene and skin antiseptic. A timeout was  performed prior to the initiation of the procedure. Select axial scans through the thorax obtained. The collection was localized and an appropriate skin entry site was determined and marked. Under CT fluoroscopic guidance, a 19 gauge percutaneous entry needle was advanced into the loculated left pleural collection. An Amplatz guidewire advanced towards the apex, confirmed on CT. Tract dilated to facilitate placement of a 14 French pigtail catheter, formed within the dependent aspect of the collection. CT confirms appropriate positioning. The catheter was secured externally with 0 Prolene suture and StatLock and placed to Pleur-Evac suction. A sample of the aspirate sent for Gram stain and culture. The patient tolerated the procedure well. COMPLICATIONS: None immediate. IMPRESSION: 1. Technically successful left pigtail chest tube placement under CT guidance. A sample of the aspirate were sent for Gram stain and culture. Electronically Signed   By: Lucrezia Europe M.D.   On: 02/12/2017 11:06   US Thoracentesis Asp Pleural Space W/img Guide  Result Date:  02/10/2017 INDICATION: Dyspnea, recent fall with coccygeal fracture, alcohol abuse, leukocytosis, complex left pleural effusion; request made for diagnostic and therapeutic left thoracentesis EXAM: ULTRASOUND GUIDED DIAGNOSTIC AND THERAPEUTIC LEFT THORACENTESIS MEDICATIONS: None. COMPLICATIONS: None immediate. PROCEDURE: An ultrasound guided thoracentesis was thoroughly discussed with the patient's son and questions answered. The benefits, risks, alternatives and complications were also discussed. The patient's son understands and wishes to proceed with the procedure. Written consent was obtained. Ultrasound was performed to localize and mark an adequate pocket of fluid in the left chest. The area was then prepped and draped in the normal sterile fashion. 1% Lidocaine was used for local anesthesia. Under ultrasound guidance a Yueh catheter was introduced. Thoracentesis was performed. The catheter was removed and a dressing applied. FINDINGS: A total of approximately 390 cc of hazy, yellow fluid was removed. Samples were sent to the laboratory as requested by the clinical team. IMPRESSION: Successful ultrasound guided diagnostic and therapeutic left thoracentesis yielding 390 cc of pleural fluid. Due to complexity/significant multiloculations of the left pleural space only the above amount of fluid could be removed at this time. Recommend TCTS consultation . Read by: Rowe Robert, PA-C Electronically Signed   By: Corrie Mckusick D.O.   On: 02/10/2017 14:04    Microbiology: Recent Results (from the past 240 hour(s))  Body fluid culture     Status: None   Collection Time: 02/10/17  1:09 PM  Result Value Ref Range Status   Specimen Description PLEURAL LEFT  Final   Special Requests PLEURAL LEFT  Final   Gram Stain   Final    FEW WBC PRESENT, PREDOMINANTLY PMN NO ORGANISMS SEEN    Culture   Final    NO GROWTH 3 DAYS Performed at [facility], [address], [place] [facility] [place], [place] [address]    Report  Status 02/14/2017 FINAL  Final  MRSA PCR Screening     Status: None   Collection Time: 02/11/17  1:20 AM  Result Value Ref Range Status   MRSA by PCR NEGATIVE NEGATIVE Final    Comment:        The GeneXpert MRSA Assay (FDA approved for NASAL specimens only), is one component of a comprehensive MRSA colonization surveillance program. It is not intended to diagnose MRSA infection nor to guide or monitor treatment for MRSA infections.   Culture, body fluid-bottle     Status: None   Collection Time: 02/12/17  9:25 AM  Result Value Ref Range Status   Specimen Description PLEURAL LEFT  Final  Special Requests NONE  Final   Culture   Final    NO GROWTH 5 DAYS Performed at [facility], [place] [address], [place], [place] [address]    Report Status 02/17/2017 FINAL  Final  Gram stain     Status: None   Collection Time: 02/12/17  9:25 AM  Result Value Ref Range Status   Specimen Description PLEURAL LEFT  Final   Special Requests   Final    NONE Performed at [facility], [place] [address] [place], [place] [address]    Gram Stain   Final    FEW WBC PRESENT,BOTH PMN AND MONONUCLEAR NO ORGANISMS SEEN    Report Status 02/12/2017 FINAL  Final     Labs: Basic Metabolic Panel:  Recent Labs Lab 02/13/17 1726  02/15/17 0400 02/16/17 0739 02/17/17 0417 02/18/17 0844 02/20/17 0320  NA  --   < > 135 135 136 135 136  K  --   < > 3.5 3.4* 3.5 3.6 3.5  CL  --   < > 98* 95* 98* 95* 97*  CO2  --   < > 31 30 32 33* 30  GLUCOSE  --   < > 109* 99 97 149* 100*  BUN  --   < > 27* 24* 21* 21* 11  CREATININE  --   < > 0.31* 0.38* 0.45 0.49 0.41*  CALCIUM  --   < > 8.5* 9.0 8.8* 8.9 8.6*  MG 1.7  --  2.0 2.0 2.0  --   --   PHOS 2.2*  --   --  3.1 2.5  --   --   < > = values in this interval not displayed. Liver Function Tests: No results for input(s): AST, ALT, ALKPHOS, BILITOT, PROT, ALBUMIN in the last 168 hours. No results for input(s): LIPASE, AMYLASE in the last 168  hours. No results for input(s): AMMONIA in the last 168 hours. CBC:  Recent Labs Lab 02/15/17 0400 02/16/17 0739 02/17/17 0417 02/18/17 0844 02/20/17 0558  WBC 13.0* 14.3* 14.6* 18.3* 16.8*  HGB 8.8* 10.2* 9.8* 10.1* 8.9*  HCT 26.7* 30.8* 29.2* 31.0* 26.5*  MCV 96.0 95.4 94.5 96.3 95.7  PLT 590* 771* 471* 793* 741*   Cardiac Enzymes: No results for input(s): CKTOTAL, CKMB, CKMBINDEX, TROPONINI in the last 168 hours. BNP: BNP (last 3 results) No results for input(s): BNP in the last 8760 hours.  ProBNP (last 3 results) No results for input(s): PROBNP in the last 8760 hours.  CBG:  Recent Labs Lab 02/19/17 1604 02/19/17 2005 02/20/17 0024 02/20/17 0445 02/20/17 0751  GLUCAP 114* 124* 104* 99 91

## 2018-04-03 ENCOUNTER — Other Ambulatory Visit: Payer: Self-pay

## 2018-04-03 ENCOUNTER — Emergency Department (HOSPITAL_COMMUNITY)
Admission: EM | Admit: 2018-04-03 | Discharge: 2018-04-04 | Disposition: A | Discharge status: Home / Self Care | Payer: 59 | Attending: Emergency Medicine | Admitting: Emergency Medicine

## 2018-04-03 ENCOUNTER — Emergency Department (HOSPITAL_COMMUNITY): Payer: 59

## 2018-04-03 ENCOUNTER — Encounter (HOSPITAL_COMMUNITY): Payer: Self-pay | Admitting: Emergency Medicine

## 2018-04-03 DIAGNOSIS — Z9013 Acquired absence of bilateral breasts and nipples: Secondary | ICD-10-CM | POA: Insufficient documentation

## 2018-04-03 DIAGNOSIS — Y92129 Unspecified place in nursing home as the place of occurrence of the external cause: Secondary | ICD-10-CM | POA: Insufficient documentation

## 2018-04-03 DIAGNOSIS — Y999 Unspecified external cause status: Secondary | ICD-10-CM | POA: Insufficient documentation

## 2018-04-03 DIAGNOSIS — S01111A Laceration without foreign body of right eyelid and periocular area, initial encounter: Secondary | ICD-10-CM | POA: Insufficient documentation

## 2018-04-03 DIAGNOSIS — Z79899 Other long term (current) drug therapy: Secondary | ICD-10-CM | POA: Insufficient documentation

## 2018-04-03 DIAGNOSIS — F0390 Unspecified dementia without behavioral disturbance: Secondary | ICD-10-CM | POA: Insufficient documentation

## 2018-04-03 DIAGNOSIS — R51 Headache: Secondary | ICD-10-CM | POA: Insufficient documentation

## 2018-04-03 DIAGNOSIS — Y9301 Activity, walking, marching and hiking: Secondary | ICD-10-CM | POA: Insufficient documentation

## 2018-04-03 DIAGNOSIS — W010XXA Fall on same level from slipping, tripping and stumbling without subsequent striking against object, initial encounter: Secondary | ICD-10-CM | POA: Insufficient documentation

## 2018-04-03 MED ORDER — ACETAMINOPHEN 325 MG PO TABS
650.0000 mg | ORAL_TABLET | Freq: Once | ORAL | Status: AC
Start: 2018-04-03 — End: 2018-04-03
  Administered 2018-04-03: 650 mg via ORAL
  Filled 2018-04-03: qty 2

## 2018-04-03 MED ORDER — LIDOCAINE-EPINEPHRINE (PF) 2 %-1:200000 IJ SOLN
10.0000 mL | Freq: Once | INTRAMUSCULAR | Status: AC
  Administered 2018-04-03: 10 mL via INTRADERMAL
  Filled 2018-04-03: qty 20

## 2018-04-03 NOTE — ED Notes (Signed)
Bed: WA09 Expected date:  Expected time:  Means of arrival:  Comments: 84 yo from SNF fall with laceration

## 2018-04-03 NOTE — ED Provider Notes (Signed)
[facility] DEPT Provider Note   CSN: [id number] Arrival date & time: 04/03/18  2132     History   Chief Complaint Chief Complaint  Patient presents with  . Fall  . Head Laceration    HPI Yolanda [name] is a 84 y.o. female.  84 y/o female with a PMH Brain stem stroke, Anxiety disorder presents to the ED s/p mechanical fall x 3 hours ago.Patient states she was walking with her walker when she slipped and felt her legs give out from under red.Patient states she landed on her right elbow and hit her head on the floor. Patient reports a mild headache along with pain on her left elbow. She is not on any blood thinners.She reports a laceration to her right eyebrow along with a hematoma above. She denies any LOC, chest pain, abdominal pain or other complaints.  Patient has a history of dementia.      Past Medical History:  Diagnosis Date  . Adenomatous colon polyp    tubular  . Anxiety disorder    panic attacks, PMH of. Dr  Janna Arch  . 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 Diverticulosis   . Fibroids    Lupron shots  . High altitude sickness 2008  . Premature menopause    due to Lupron    Patient Active Problem List   Diagnosis Date Noted  . Empyema lung ([place])   . Pleural effusion on left   . Acute respiratory failure with hypoxemia ([place])   . Hypoxia   . Leukocytosis   . Severe protein-calorie malnutrition ([place])   . Acute bronchitis 02/07/2017  . ETOH abuse 02/07/2017  . Fall 02/07/2017  . Coccyx pain 02/07/2017  . Abnormal EKG 11/24/2015  . Pre-operative cardiovascular examination 11/24/2015  . Non-compliant behavior 06/23/2015  . ARTHRALGIA 05/14/2010  . SIGMOID POLYP 10/10/2009  . CARPAL TUNNEL SYNDROME 05/24/2009  . FASTING HYPERGLYCEMIA 05/24/2009  . ASTHMA 05/19/2008  . GERD 05/19/2008  . ELEVATED BLOOD PRESSURE WITHOUT DIAGNOSIS OF HYPERTENSION 05/19/2008  . TRIGGER FINGER,  RIGHT MIDDLE 10/05/2007  . 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 BREAST LUMPECTOMY  1984   radiation , R breast  . CESAREAN SECTION     X 2  . colonoscopy with polypectomy  2011   Dr  Delfin Edis  . LAMINECTOMY     X3 L-Sspine  . MASTECTOMY  1990   R breast  . MASTECTOMY  1996   L     OB History   None      Home Medications    Prior to Admission medications   Medication Sig Start Date End Date Taking? Authorizing Provider  acetaminophen (TYLENOL) 325 MG tablet Take 2 tablets (650 mg total) by mouth every 6 (six) hours as needed for mild pain (or Fever >/= 101). 02/20/17   Robbie Lis, MD  albuterol (PROVENTIL) (2.5 MG/3ML) 0.083% nebulizer solution Take 3 mLs (2.5 mg total) by nebulization every 3 (three) hours as needed for wheezing or shortness of breath. 02/20/17   Robbie Lis, MD  cefpodoxime (VANTIN) 200 MG tablet Take 1 tablet (200 mg total) by mouth every 12 (twelve) hours. 02/20/17   Robbie Lis, MD  docusate (COLACE) 50 MG/5ML liquid Take 10 mLs (100 mg total) by mouth 2 (two) times daily as needed for mild constipation. 02/20/17   Robbie Lis, MD  famotidine (PEPCID) 20 MG tablet Take 1 tablet (20 mg total) by mouth 2 (two) times daily. 02/20/17   Robbie Lis, MD  feeding supplement, ENSURE ENLIVE, (ENSURE ENLIVE) LIQD Take 237 mLs by mouth 2 (two) times daily between meals. 02/20/17   Robbie Lis, MD  FLUoxetine (PROZAC) 20 MG capsule Take 20 mg by mouth daily.    [provider]  folic acid (FOLVITE) 1 MG tablet Take 1 tablet (1 mg total) by mouth daily. 02/20/17   Robbie Lis, MD  guaiFENesin (ROBITUSSIN) 100 MG/5ML SOLN Take 20 mLs (400 mg total) by mouth 2 (two) times daily. 02/20/17   Robbie Lis, MD  ipratropium-albuterol (DUONEB) 0.5-2.5 (3) MG/3ML SOLN Take 3 mLs by nebulization 3 (three) times daily. 02/20/17   Robbie Lis, MD  Multiple Vitamin (MULTIVITAMIN WITH MINERALS) TABS  tablet Take 1 tablet by mouth daily. 02/20/17   Robbie Lis, MD  naloxegol oxalate (MOVANTIK) 25 MG TABS tablet Take by mouth daily.    [provider]  Oxycodone HCl 10 MG TABS Take 1 tablet (10 mg total) by mouth every 6 (six) hours as needed. 02/20/17   Robbie Lis, MD  thiamine 100 MG tablet Take 1 tablet (100 mg total) by mouth daily. 02/20/17   Robbie Lis, MD  traZODone (DESYREL) 50 MG tablet Take 1 tablet (50 mg total) by mouth at bedtime. 02/20/17   Robbie Lis, MD    Family History Family History  Problem Relation Age of Onset  . Asthma Maternal Grandmother   . Coronary artery disease Mother        dysrrhythmia  . Drug abuse Brother   . Heart Problems Father   . Cancer Neg Hx     Social History Social History   Tobacco Use  . Smoking status: Former Smoker    Last attempt to quit: 08/19/1978    Years since quitting: 39.6  . Smokeless tobacco: Never Used  . Tobacco comment: 25 years ago as of 2012  Substance Use Topics  . Alcohol use: Yes    Alcohol/week: 9.0 standard drinks    Types: 9 Glasses of wine per week    Comment: wine with dinner   . Drug use: No     Allergies   Patient has no known allergies.   Review of Systems Review of Systems  Constitutional: Negative for chills and fever.  HENT: Negative for ear pain and sore throat.   Eyes: Negative for pain and visual disturbance.  Respiratory: Negative for cough and shortness of breath.   Cardiovascular: Negative for chest pain and palpitations.  Gastrointestinal: Negative for abdominal pain and vomiting.  Genitourinary: Negative for dysuria and hematuria.  Musculoskeletal: Negative for arthralgias and back pain.  Skin: Positive for wound. Negative for color change and rash.  Neurological: Positive for headaches. Negative for seizures and syncope.  All other systems reviewed and are negative.    Physical Exam Updated Vital Signs BP (!) 169/73 (BP Location: Right Arm)   Pulse 80   Temp  98.1 F (36.7 C) (Oral)   Resp 18   SpO2 90%   Physical Exam  Constitutional: She is oriented to person, place, and time. She appears well-developed and well-nourished. No distress.  HENT:  Head: Normocephalic. Head is with laceration.    Mouth/Throat: Oropharynx is clear and moist. No oropharyngeal exudate.  2-3 cm laceration to the right eyebrow.   Eyes: Pupils are equal, round, and reactive to light.  Neck: Normal range of motion.  Cardiovascular: Regular rhythm and normal heart sounds.  Pulmonary/Chest: Effort normal and breath sounds normal. No respiratory distress.  Abdominal: Soft. Bowel sounds are normal. She exhibits no distension. There is no tenderness.  Musculoskeletal: She exhibits no tenderness or deformity.       Right lower leg: She exhibits no edema.       Left lower leg: She exhibits no edema.  Neurological: She is alert and oriented to person, place, and time.  Skin: Skin is warm and dry.  Psychiatric: She has a normal mood and affect.  Nursing note and vitals reviewed.    ED Treatments / Results  Labs (all labs ordered are listed, but only abnormal results are displayed) Labs Reviewed - No data to display  EKG None  Radiology Ct Head Wo Contrast  Result Date: 04/03/2018 CLINICAL DATA:  Patient status post trauma. EXAM: CT HEAD WITHOUT CONTRAST CT CERVICAL SPINE WITHOUT CONTRAST TECHNIQUE: Multidetector CT imaging of the head and cervical spine was performed following the standard protocol without intravenous contrast. Multiplanar CT image reconstructions of the cervical spine were also generated. COMPARISON:  None. FINDINGS: CT HEAD FINDINGS Brain: Ventricles and sulci are prominent compatible with atrophy. Old right basal ganglia lacunar infarct. Old right occipital lobe infarct. Old right cerebellar hemisphere infarcts. No evidence for acute cortically based infarct, intracranial hemorrhage, mass lesion or mass-effect. Vascular: Internal carotid arterial  vascular calcifications. Skull: Intact. Sinuses/Orbits: Paranasal sinuses are well aerated. Mastoid air cells are unremarkable. Other: Mild right periorbital soft tissue swelling. CT CERVICAL SPINE FINDINGS Alignment: Normal anatomic alignment. Skull base and vertebrae: Intact.  Degenerative changes. Soft tissues and spinal canal: Unremarkable. Disc levels: Multilevel degenerative disc disease most pronounced C2-3, C3-4, C4-5, C5-6 and C6-7. Multilevel facet degenerative changes. No evidence for acute fracture. Upper chest: Unremarkable. Other: None. IMPRESSION: No acute intracranial process. Mild right periorbital soft tissue swelling. No acute cervical spine fracture. Multilevel degenerative disc disease. Electronically Signed   By: Lovey Newcomer M.D.   On: 04/03/2018 23:00   Ct Cervical Spine Wo Contrast  Result Date: 04/03/2018 CLINICAL DATA:  Patient status post trauma. EXAM: CT HEAD WITHOUT CONTRAST CT CERVICAL SPINE WITHOUT CONTRAST TECHNIQUE: Multidetector CT imaging of the head and cervical spine was performed following the standard protocol without intravenous contrast. Multiplanar CT image reconstructions of the cervical spine were also generated. COMPARISON:  None. FINDINGS: CT HEAD FINDINGS Brain: Ventricles and sulci are prominent compatible with atrophy. Old right basal ganglia lacunar infarct. Old right occipital lobe infarct. Old right cerebellar hemisphere infarcts. No evidence for acute cortically based infarct, intracranial hemorrhage, mass lesion or mass-effect. Vascular: Internal carotid arterial vascular calcifications. Skull: Intact. Sinuses/Orbits: Paranasal sinuses are well aerated. Mastoid air cells are unremarkable. Other: Mild right periorbital soft tissue swelling. CT CERVICAL SPINE FINDINGS Alignment: Normal anatomic alignment. Skull base and vertebrae: Intact.  Degenerative changes. Soft tissues and spinal canal: Unremarkable. Disc levels: Multilevel degenerative disc disease most  pronounced C2-3, C3-4, C4-5, C5-6 and C6-7. Multilevel facet degenerative changes. No evidence for acute fracture. Upper chest: Unremarkable. Other: None. IMPRESSION: No acute intracranial process. Mild right periorbital soft tissue swelling. No acute cervical spine fracture. Multilevel degenerative disc disease. Electronically Signed   By: Lovey Newcomer M.D.   On: 04/03/2018 23:00    Procedures .Marland KitchenLaceration Repair Date/Time: 04/04/2018 12:01 AM Performed by: Janeece Fitting, PA-C Authorized by: Janeece Fitting, PA-C   Consent:    Consent obtained:  Verbal   Consent given by:  Patient and guardian   Risks discussed:  Infection, pain, poor cosmetic result, poor wound healing and nerve damage   Alternatives discussed:  No treatment and delayed treatment Anesthesia (see MAR for exact dosages):    Anesthesia method:  Local infiltration   Local anesthetic:  Lidocaine 2% WITH epi Laceration details:    Location:  Face   Face location:  R eyebrow   Length (cm):  5   Depth (mm):  0.5 Repair type:    Repair type:  Simple Exploration:    Hemostasis achieved with:  Direct pressure   Contaminated: no   Treatment:    Area cleansed with:  Shur-Clens   Amount of cleaning:  Extensive   Irrigation solution:  Sterile saline Skin repair:    Repair method:  Sutures   Suture size:  5-0   Suture material:  Prolene   Suture technique:  Simple interrupted   Number of sutures:  6 Approximation:    Approximation:  Close Post-procedure details:    Dressing:  Open (no dressing)   Patient tolerance of procedure:  Tolerated well, no immediate complications   (including critical care time)  Medications Ordered in ED Medications  lidocaine-EPINEPHrine (XYLOCAINE W/EPI) 2 %-1:200000 (PF) injection 10 mL (10 mLs Intradermal Given 04/03/18 2318)  acetaminophen (TYLENOL) tablet 650 mg (650 mg Oral Given 04/03/18 2325)     Initial Impression / Assessment and Plan / ED Course  I have reviewed the triage vital  signs and the nursing notes.  Pertinent labs & imaging results that were available during my care of the patient were reviewed by me and considered in my medical decision making (see chart for details).     Patient presents s/p mechanical fall at home 3 hours ago.Patient has a hematoma above her right eyebrow along with a laceration of her right eyebrow. Patient is currently not on any blood thinners.  CT Head and Cspine showed no acute hemorrhage, fracture or acute process.  Patient care taker is at the bedside, I have explained all the results thus far with care taker. I will personably repair patient's laceration at this time. I have placed 6 sutures to her right eyebrow. Patient and caretaker have been advised to have sutures removed in 7-10 days. Follow up with PCP if symptoms worsen.Return precautions provided.   Final Clinical Impressions(s) / ED Diagnoses   Final diagnoses:  Eyebrow laceration, right, initial encounter    ED Discharge Orders    None       Janeece Fitting, PA-C 04/04/18 0006    Lennice Sites, DO 04/04/18 0100

## 2018-04-03 NOTE — ED Triage Notes (Signed)
Pt BIB EMS from [facility] s/p fall. Patient has head lac with bleeding controlled to right forehead. Patient states that she tripped over her slippers. Hx dementia, but is at baseline per caretaker. No thinners on board.

## 2018-04-03 NOTE — ED Notes (Signed)
Patient transported to CT 

## 2018-04-04 NOTE — Discharge Instructions (Addendum)
I have placed 6 sutures to your right eyebrow.Please have sutures removed within 7-10 days.You may apply triple antibiotic ointment to the area if needed. Please follow up with PCP for reevaluation in symptoms within 1 week. You may alternate tylenol or ibuprofen for the pain.

## 2018-06-26 ENCOUNTER — Emergency Department (HOSPITAL_COMMUNITY)
Admission: EM | Admit: 2018-06-26 | Discharge: 2018-06-27 | Disposition: A | Discharge status: Skilled Nursing Facility | Payer: Commercial Managed Care - PPO | Attending: Emergency Medicine | Admitting: Emergency Medicine

## 2018-06-26 ENCOUNTER — Encounter (HOSPITAL_COMMUNITY): Payer: Self-pay | Admitting: Nurse Practitioner

## 2018-06-26 DIAGNOSIS — E860 Dehydration: Secondary | ICD-10-CM | POA: Diagnosis not present

## 2018-06-26 DIAGNOSIS — R197 Diarrhea, unspecified: Secondary | ICD-10-CM

## 2018-06-26 DIAGNOSIS — F0390 Unspecified dementia without behavioral disturbance: Secondary | ICD-10-CM | POA: Insufficient documentation

## 2018-06-26 DIAGNOSIS — Z79899 Other long term (current) drug therapy: Secondary | ICD-10-CM | POA: Insufficient documentation

## 2018-06-26 DIAGNOSIS — Z87891 Personal history of nicotine dependence: Secondary | ICD-10-CM | POA: Insufficient documentation

## 2018-06-26 DIAGNOSIS — J45909 Unspecified asthma, uncomplicated: Secondary | ICD-10-CM | POA: Insufficient documentation

## 2018-06-26 LAB — CBC WITH DIFFERENTIAL/PLATELET
ABS IMMATURE GRANULOCYTES: 0.08 10*3/uL — AB (ref 0.00–0.07)
Basophils Absolute: 0 10*3/uL (ref 0.0–0.1)
Basophils Relative: 0 %
Eosinophils Absolute: 0.1 10*3/uL (ref 0.0–0.5)
Eosinophils Relative: 1 %
HEMATOCRIT: 39.3 % (ref 36.0–46.0)
HEMOGLOBIN: 12.4 g/dL (ref 12.0–15.0)
Immature Granulocytes: 1 %
LYMPHS ABS: 1.5 10*3/uL (ref 0.7–4.0)
LYMPHS PCT: 10 %
MCH: 31.5 pg (ref 26.0–34.0)
MCHC: 31.6 g/dL (ref 30.0–36.0)
MCV: 99.7 fL (ref 80.0–100.0)
MONOS PCT: 6 %
Monocytes Absolute: 1 10*3/uL (ref 0.1–1.0)
NEUTROS ABS: 12.5 10*3/uL — AB (ref 1.7–7.7)
Neutrophils Relative %: 82 %
Platelets: 345 10*3/uL (ref 150–400)
RBC: 3.94 MIL/uL (ref 3.87–5.11)
RDW: 13.1 % (ref 11.5–15.5)
WBC: 15.2 10*3/uL — AB (ref 4.0–10.5)
nRBC: 0 % (ref 0.0–0.2)

## 2018-06-26 LAB — BASIC METABOLIC PANEL
Anion gap: 10 (ref 5–15)
BUN: 19 mg/dL (ref 8–23)
CALCIUM: 9.9 mg/dL (ref 8.9–10.3)
CO2: 29 mmol/L (ref 22–32)
CREATININE: 0.97 mg/dL (ref 0.44–1.00)
Chloride: 101 mmol/L (ref 98–111)
GFR calc Af Amer: >60 mL/min (ref >60)
GFR calc non Af Amer: 53 mL/min — ABNORMAL LOW (ref >60)
GLUCOSE: 116 mg/dL — AB (ref 70–99)
Potassium: 3.9 mmol/L (ref 3.5–5.1)
SODIUM: 140 mmol/L (ref 135–145)

## 2018-06-26 MED ORDER — SODIUM CHLORIDE 0.9 % IV BOLUS
1000.0000 mL | Freq: Once | INTRAVENOUS | Status: AC
  Administered 2018-06-26: 1000 mL via INTRAVENOUS

## 2018-06-26 NOTE — ED Triage Notes (Signed)
Pt arrives via EMS from [facility] facility, over 6 hours has had increasing weakness and uncontrolled diarrhea. She was also give mirilax. Hx of dementia, A/O x3. 134/75, hr 88, RR 18, 92% RA.

## 2018-06-26 NOTE — Discharge Instructions (Addendum)
Discontinue miralax but titrate to soft stools to avoid constipation

## 2018-06-26 NOTE — ED Notes (Signed)
EDP [place] removed o2 after learning pt did not wear at home. NT Chantella Creech H, placed pt back on nasal canula until pt is transported by [facility]. Pt sat was at 84 prior to o2

## 2018-06-26 NOTE — ED Notes (Signed)
Bed: WA01 Expected date:  Expected time:  Means of arrival:  Comments: EMS 84 yo female generalized weakness and diarrhea but was constipated and given Miralax

## 2018-06-26 NOTE — ED Provider Notes (Signed)
[name] DEPT Provider Note   CSN: [id number] Arrival date & time: 06/26/18  2111     History   Chief Complaint Chief Complaint  Patient presents with  . Diarrhea  . Fatigue    HPI Yolanda [name] is a 84 y.o. female.  The history is provided by the patient and a relative.  Diarrhea   This is a new problem. The current episode started 6 to 12 hours ago. The problem occurs 2 to 4 times per day. The problem has been gradually improving. The stool consistency is described as watery. There has been no fever. The fever has been present for less than 1 day. Pertinent negatives include no abdominal pain, no vomiting, no chills, no sweats, no headaches, no arthralgias, no myalgias, no URI and no cough. She has tried increased fluid intake for the symptoms. The treatment provided mild relief. Risk factors: patient was given large miralax treatment today due to constipation.     Past Medical History:  Diagnosis Date  . Adenomatous colon polyp    tubular  . Anxiety disorder    panic attacks, PMH of. Dr  Janna Arch  . 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 Diverticulosis   . Fibroids    Lupron shots  . High altitude sickness 2008  . Premature menopause    due to Lupron    Patient Active Problem List   Diagnosis Date Noted  . Empyema lung ([place])   . Pleural effusion on left   . Acute respiratory failure with hypoxemia ([place])   . Hypoxia   . Leukocytosis   . Severe protein-calorie malnutrition ([place])   . Acute bronchitis 02/07/2017  . ETOH abuse 02/07/2017  . Fall 02/07/2017  . Coccyx pain 02/07/2017  . Abnormal EKG 11/24/2015  . Pre-operative cardiovascular examination 11/24/2015  . Non-compliant behavior 06/23/2015  . ARTHRALGIA 05/14/2010  . SIGMOID POLYP 10/10/2009  . CARPAL TUNNEL SYNDROME 05/24/2009  . FASTING HYPERGLYCEMIA 05/24/2009  . ASTHMA 05/19/2008  . GERD 05/19/2008  . ELEVATED BLOOD  PRESSURE WITHOUT DIAGNOSIS OF HYPERTENSION 05/19/2008  . TRIGGER FINGER, RIGHT MIDDLE 10/05/2007  . 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 BREAST LUMPECTOMY  1984   radiation , R breast  . CESAREAN SECTION     X 2  . colonoscopy with polypectomy  2011   Dr  Delfin Edis  . LAMINECTOMY     X3 L-Sspine  . MASTECTOMY  1990   R breast  . MASTECTOMY  1996   L     OB History   None      Home Medications    Prior to Admission medications   Medication Sig Start Date End Date Taking? Authorizing Provider  acetaminophen (TYLENOL) 500 MG tablet Take 1,000 mg by mouth every 6 (six) hours as needed for moderate pain.   Yes [provider]  donepezil (ARICEPT) 10 MG tablet Take 10 mg by mouth at bedtime.  06/19/18  Yes [provider]  DULoxetine (CYMBALTA) 20 MG capsule Take 20 mg by mouth 2 (two) times daily.  06/19/18  Yes [provider]  FLUoxetine (PROZAC) 20 MG capsule Take 20 mg by mouth daily.   Yes [provider]  folic acid (FOLVITE) 1 MG tablet Take 1 tablet (1 mg total) by mouth daily. 02/20/17  Yes Robbie Lis, MD  MOVANTIK 12.5 MG TABS tablet Take 12.5  mg by mouth daily.  06/25/18  Yes [provider]  Multiple Vitamin (MULTIVITAMIN WITH MINERALS) TABS tablet Take 1 tablet by mouth daily. 02/20/17  Yes Robbie Lis, MD  naproxen sodium (ALEVE) 220 MG tablet Take 440 mg by mouth 2 (two) times daily as needed (pain).   Yes [provider]  polyethylene glycol (MIRALAX / GLYCOLAX) packet Take 17 g by mouth daily as needed for mild constipation.   Yes [provider]  thiamine 100 MG tablet Take 1 tablet (100 mg total) by mouth daily. 02/20/17  Yes Robbie Lis, MD  traZODone (DESYREL) 50 MG tablet Take 1 tablet (50 mg total) by mouth at bedtime. 02/20/17  Yes Robbie Lis, MD  XTAMPZA ER 18 MG C12A Take 1 tablet by mouth 2 (two) times daily.  06/24/18  Yes  [provider]  acetaminophen (TYLENOL) 325 MG tablet Take 2 tablets (650 mg total) by mouth every 6 (six) hours as needed for mild pain (or Fever >/= 101). Patient not taking: Reported on 06/26/2018 02/20/17   Robbie Lis, MD  albuterol (PROVENTIL) (2.5 MG/3ML) 0.083% nebulizer solution Take 3 mLs (2.5 mg total) by nebulization every 3 (three) hours as needed for wheezing or shortness of breath. Patient not taking: Reported on 06/26/2018 02/20/17   Robbie Lis, MD  cefpodoxime (VANTIN) 200 MG tablet Take 1 tablet (200 mg total) by mouth every 12 (twelve) hours. Patient not taking: Reported on 06/26/2018 02/20/17   Robbie Lis, MD  Cholecalciferol (VITAMIN D3) 1.25 MG (50000 UT) CAPS Take 1 capsule by mouth once a week.  05/07/18   [provider]  cyclobenzaprine (FLEXERIL) 5 MG tablet Take 5 mg by mouth 3 (three) times daily as needed for muscle spasms.  03/30/18   [provider]  docusate (COLACE) 50 MG/5ML liquid Take 10 mLs (100 mg total) by mouth 2 (two) times daily as needed for mild constipation. Patient not taking: Reported on 06/26/2018 02/20/17   Robbie Lis, MD  famotidine (PEPCID) 20 MG tablet Take 1 tablet (20 mg total) by mouth 2 (two) times daily. Patient not taking: Reported on 06/26/2018 02/20/17   Robbie Lis, MD  feeding supplement, ENSURE ENLIVE, (ENSURE ENLIVE) LIQD Take 237 mLs by mouth 2 (two) times daily between meals. Patient not taking: Reported on 06/26/2018 02/20/17   Robbie Lis, MD  guaiFENesin (ROBITUSSIN) 100 MG/5ML SOLN Take 20 mLs (400 mg total) by mouth 2 (two) times daily. Patient not taking: Reported on 06/26/2018 02/20/17   Robbie Lis, MD  ibandronate (BONIVA) 150 MG tablet Take 150 mg by mouth every 30 (thirty) days.  06/13/18   [provider]  ipratropium-albuterol (DUONEB) 0.5-2.5 (3) MG/3ML SOLN Take 3 mLs by nebulization 3 (three) times daily. Patient not taking: Reported on 06/26/2018 02/20/17   Robbie Lis, MD    Oxycodone HCl 10 MG TABS Take 1 tablet (10 mg total) by mouth every 6 (six) hours as needed. Patient not taking: Reported on 06/26/2018 02/20/17   Robbie Lis, MD    Family History Family History  Problem Relation Age of Onset  . Asthma Maternal Grandmother   . Coronary artery disease Mother        dysrrhythmia  . Drug abuse Brother   . Heart Problems Father   . Cancer Neg Hx     Social History Social History   Tobacco Use  . Smoking status: Former Smoker    Last attempt  to quit: 08/19/1978    Years since quitting: 39.8  . Smokeless tobacco: Never Used  . Tobacco comment: 25 years ago as of 2012  Substance Use Topics  . Alcohol use: Yes    Alcohol/week: 9.0 standard drinks    Types: 9 Glasses of wine per week    Comment: wine with dinner   . Drug use: No     Allergies   Patient has no known allergies.   Review of Systems Review of Systems  Constitutional: Negative for chills and fever.  HENT: Negative for ear pain and sore throat.   Eyes: Negative for pain and visual disturbance.  Respiratory: Negative for cough and shortness of breath.   Cardiovascular: Negative for chest pain and palpitations.  Gastrointestinal: Positive for diarrhea. Negative for abdominal pain and vomiting.  Genitourinary: Negative for dysuria and hematuria.  Musculoskeletal: Negative for arthralgias, back pain and myalgias.  Skin: Negative for color change and rash.  Neurological: Negative for seizures, syncope and headaches.  All other systems reviewed and are negative.    Physical Exam Updated Vital Signs  ED Triage Vitals  Enc Vitals Group     BP 06/26/18 2128 128/76     Pulse Rate 06/26/18 2128 91     Resp 06/26/18 2128 17     Temp 06/26/18 2128 98.4 F (36.9 C)     Temp Source 06/26/18 2128 Oral     SpO2 06/26/18 2128 99 %     Weight --      Height --      Head Circumference --      Peak Flow --      Pain Score 06/26/18 2136 0     Pain Loc --      Pain Edu? --       Excl. in [place]? --     Physical Exam  Constitutional: She appears well-developed and well-nourished. No distress.  HENT:  Head: Normocephalic and atraumatic.  Eyes: Pupils are equal, round, and reactive to light. Conjunctivae and EOM are normal.  Neck: Normal range of motion. Neck supple.  Cardiovascular: Normal rate, regular rhythm, normal heart sounds and intact distal pulses.  No murmur heard. Pulmonary/Chest: Effort normal and breath sounds normal. No respiratory distress.  Abdominal: Soft. She exhibits no distension. There is no tenderness.  Musculoskeletal: Normal range of motion. She exhibits no edema.  Neurological: She is alert.  Skin: Skin is warm and dry.  Psychiatric: She has a normal mood and affect.  Nursing note and vitals reviewed.    ED Treatments / Results  Labs (all labs ordered are listed, but only abnormal results are displayed) Labs Reviewed  CBC WITH DIFFERENTIAL/PLATELET - Abnormal; Notable for the following components:      Result Value   WBC 15.2 (*)    Neutro Abs 12.5 (*)    Abs Immature Granulocytes 0.08 (*)    All other components within normal limits  BASIC METABOLIC PANEL - Abnormal; Notable for the following components:   Glucose, Bld 116 (*)    GFR calc non Af Amer 53 (*)    All other components within normal limits    EKG None  Radiology No results found.  Procedures Procedures (including critical care time)  Medications Ordered in ED Medications  sodium chloride 0.9 % bolus 1,000 mL (0 mLs Intravenous Stopped 06/27/18 0001)     Initial Impression / Assessment and Plan / ED Course  I have reviewed the triage vital signs and the nursing notes.  Pertinent labs & imaging results that were available during my care of the patient were reviewed by me and considered in my medical decision making (see chart for details).     Yolanda [name] is an 84 year old female with history of dementia who presents to the ED with diarrhea.   Patient with unremarkable vitals.  Patient is here with family who states that patient was given MiraLAX treatment today due to severe constipation.  She has had multiple watery stools today and felt mildly lightheaded and was sent due to concern for dehydration.  Patient with oxygen in the high 80s low 90s and family states that patient with normal oxygen between 86 and 92%.  She had bad chest infection about a year ago and is a former smoker and is no longer on any home oxygen and states that her oxygen status is around this range.  Patient is overall well-appearing.  Clear breath sounds.  No signs of respiratory distress.  No abdominal tenderness on exam.  She does feel mildly lightheaded but denies any blood in her stool.  Lab work showed some mild leukocytosis but otherwise no significant electrolyte abnormality, kidney injury, anemia.  Patient was given lactated Ringer bolus and felt improved.  Patient usually with creatinine of around 0.6 and mildly elevated but still within normal limits.  Suspect mild dehydration.  Recommend that they titrate MiraLAX to affect and likely hold on any further treatments until stool is more firm.  Patient was discharged from ED in good condition.  Family understands return precautions.  This chart was dictated using voice recognition software.  Despite best efforts to proofread,  errors can occur which can change the documentation meaning.   Final Clinical Impressions(s) / ED Diagnoses   Final diagnoses:  Dehydration  Diarrhea, unspecified type    ED Discharge Orders    None       Lennice Sites, DO 06/27/18 0120

## 2018-06-26 NOTE — ED Notes (Signed)
[facility] called for transportation  

## 2018-06-29 ENCOUNTER — Other Ambulatory Visit: Payer: Self-pay

## 2018-06-29 ENCOUNTER — Emergency Department (HOSPITAL_COMMUNITY): Payer: Commercial Managed Care - PPO

## 2018-06-29 ENCOUNTER — Encounter (HOSPITAL_COMMUNITY): Payer: Self-pay

## 2018-06-29 ENCOUNTER — Inpatient Hospital Stay (HOSPITAL_COMMUNITY)
Admission: EM | Admit: 2018-06-29 | Discharge: 2018-07-05 | DRG: 871 | Disposition: A | Discharge status: Skilled Nursing Facility | Payer: Commercial Managed Care - PPO | Source: Skilled Nursing Facility | Attending: Internal Medicine | Admitting: Internal Medicine

## 2018-06-29 DIAGNOSIS — Z813 Family history of other psychoactive substance abuse and dependence: Secondary | ICD-10-CM | POA: Diagnosis not present

## 2018-06-29 DIAGNOSIS — K219 Gastro-esophageal reflux disease without esophagitis: Secondary | ICD-10-CM | POA: Diagnosis present

## 2018-06-29 DIAGNOSIS — R918 Other nonspecific abnormal finding of lung field: Secondary | ICD-10-CM

## 2018-06-29 DIAGNOSIS — F0390 Unspecified dementia without behavioral disturbance: Secondary | ICD-10-CM | POA: Diagnosis not present

## 2018-06-29 DIAGNOSIS — J90 Pleural effusion, not elsewhere classified: Secondary | ICD-10-CM | POA: Diagnosis not present

## 2018-06-29 DIAGNOSIS — R0902 Hypoxemia: Secondary | ICD-10-CM | POA: Diagnosis not present

## 2018-06-29 DIAGNOSIS — R040 Epistaxis: Secondary | ICD-10-CM | POA: Diagnosis not present

## 2018-06-29 DIAGNOSIS — A419 Sepsis, unspecified organism: Principal | ICD-10-CM | POA: Diagnosis present

## 2018-06-29 DIAGNOSIS — J440 Chronic obstructive pulmonary disease with acute lower respiratory infection: Secondary | ICD-10-CM | POA: Diagnosis present

## 2018-06-29 DIAGNOSIS — J84112 Idiopathic pulmonary fibrosis: Secondary | ICD-10-CM | POA: Diagnosis not present

## 2018-06-29 DIAGNOSIS — F329 Major depressive disorder, single episode, unspecified: Secondary | ICD-10-CM | POA: Diagnosis present

## 2018-06-29 DIAGNOSIS — J189 Pneumonia, unspecified organism: Secondary | ICD-10-CM | POA: Diagnosis present

## 2018-06-29 DIAGNOSIS — G894 Chronic pain syndrome: Secondary | ICD-10-CM | POA: Diagnosis present

## 2018-06-29 DIAGNOSIS — E876 Hypokalemia: Secondary | ICD-10-CM | POA: Diagnosis present

## 2018-06-29 DIAGNOSIS — Z825 Family history of asthma and other chronic lower respiratory diseases: Secondary | ICD-10-CM | POA: Diagnosis not present

## 2018-06-29 DIAGNOSIS — J9601 Acute respiratory failure with hypoxia: Secondary | ICD-10-CM | POA: Diagnosis present

## 2018-06-29 DIAGNOSIS — Z8249 Family history of ischemic heart disease and other diseases of the circulatory system: Secondary | ICD-10-CM

## 2018-06-29 DIAGNOSIS — I351 Nonrheumatic aortic (valve) insufficiency: Secondary | ICD-10-CM | POA: Diagnosis not present

## 2018-06-29 DIAGNOSIS — I5181 Takotsubo syndrome: Secondary | ICD-10-CM | POA: Diagnosis present

## 2018-06-29 DIAGNOSIS — Z87891 Personal history of nicotine dependence: Secondary | ICD-10-CM | POA: Diagnosis not present

## 2018-06-29 DIAGNOSIS — K5790 Diverticulosis of intestine, part unspecified, without perforation or abscess without bleeding: Secondary | ICD-10-CM | POA: Diagnosis present

## 2018-06-29 DIAGNOSIS — F05 Delirium due to known physiological condition: Secondary | ICD-10-CM | POA: Diagnosis not present

## 2018-06-29 DIAGNOSIS — I5041 Acute combined systolic (congestive) and diastolic (congestive) heart failure: Secondary | ICD-10-CM | POA: Diagnosis present

## 2018-06-29 DIAGNOSIS — R0602 Shortness of breath: Secondary | ICD-10-CM | POA: Diagnosis present

## 2018-06-29 DIAGNOSIS — Z86010 Personal history of colonic polyps: Secondary | ICD-10-CM

## 2018-06-29 DIAGNOSIS — Z9013 Acquired absence of bilateral breasts and nipples: Secondary | ICD-10-CM | POA: Diagnosis not present

## 2018-06-29 DIAGNOSIS — Y95 Nosocomial condition: Secondary | ICD-10-CM | POA: Diagnosis present

## 2018-06-29 DIAGNOSIS — I080 Rheumatic disorders of both mitral and aortic valves: Secondary | ICD-10-CM | POA: Diagnosis present

## 2018-06-29 DIAGNOSIS — R1310 Dysphagia, unspecified: Secondary | ICD-10-CM | POA: Diagnosis not present

## 2018-06-29 DIAGNOSIS — F0391 Unspecified dementia with behavioral disturbance: Secondary | ICD-10-CM | POA: Diagnosis present

## 2018-06-29 DIAGNOSIS — J810 Acute pulmonary edema: Secondary | ICD-10-CM | POA: Diagnosis not present

## 2018-06-29 DIAGNOSIS — I340 Nonrheumatic mitral (valve) insufficiency: Secondary | ICD-10-CM | POA: Diagnosis not present

## 2018-06-29 HISTORY — DX: Pyothorax without fistula: J86.9

## 2018-06-29 HISTORY — DX: Gastro-esophageal reflux disease without esophagitis: K21.9

## 2018-06-29 LAB — URINALYSIS, ROUTINE W REFLEX MICROSCOPIC
BACTERIA UA: NONE SEEN
Bilirubin Urine: NEGATIVE
Glucose, UA: NEGATIVE mg/dL
Hgb urine dipstick: NEGATIVE
KETONES UR: 20 mg/dL — AB
Leukocytes, UA: NEGATIVE
Nitrite: NEGATIVE
PH: 5 (ref 5.0–8.0)
Protein, ur: 30 mg/dL — AB
SPECIFIC GRAVITY, URINE: 1.026 (ref 1.005–1.030)

## 2018-06-29 LAB — COMPREHENSIVE METABOLIC PANEL
ALT: 22 U/L (ref 0–44)
AST: 30 U/L (ref 15–41)
Albumin: 3.7 g/dL (ref 3.5–5.0)
Alkaline Phosphatase: 61 U/L (ref 38–126)
Anion gap: 9 (ref 5–15)
BUN: 25 mg/dL — AB (ref 8–23)
CHLORIDE: 101 mmol/L (ref 98–111)
CO2: 29 mmol/L (ref 22–32)
CREATININE: 0.8 mg/dL (ref 0.44–1.00)
Calcium: 9.7 mg/dL (ref 8.9–10.3)
GFR calc Af Amer: >60 mL/min (ref >60)
GLUCOSE: 145 mg/dL — AB (ref 70–99)
Potassium: 3.4 mmol/L — ABNORMAL LOW (ref 3.5–5.1)
Sodium: 139 mmol/L (ref 135–145)
Total Bilirubin: 0.9 mg/dL (ref 0.3–1.2)
Total Protein: 6.7 g/dL (ref 6.5–8.1)

## 2018-06-29 LAB — CBC WITH DIFFERENTIAL/PLATELET
Abs Immature Granulocytes: 0.1 10*3/uL — ABNORMAL HIGH (ref 0.00–0.07)
Basophils Absolute: 0 10*3/uL (ref 0.0–0.1)
Basophils Relative: 0 %
EOS ABS: 0 10*3/uL (ref 0.0–0.5)
Eosinophils Relative: 0 %
HEMATOCRIT: 35.8 % — AB (ref 36.0–46.0)
Hemoglobin: 11.2 g/dL — ABNORMAL LOW (ref 12.0–15.0)
Immature Granulocytes: 1 %
LYMPHS ABS: 0.9 10*3/uL (ref 0.7–4.0)
LYMPHS PCT: 5 %
MCH: 31.5 pg (ref 26.0–34.0)
MCHC: 31.3 g/dL (ref 30.0–36.0)
MCV: 100.8 fL — AB (ref 80.0–100.0)
Monocytes Absolute: 1.5 10*3/uL — ABNORMAL HIGH (ref 0.1–1.0)
Monocytes Relative: 9 %
Neutro Abs: 14.2 10*3/uL — ABNORMAL HIGH (ref 1.7–7.7)
Neutrophils Relative %: 85 %
Platelets: 302 10*3/uL (ref 150–400)
RBC: 3.55 MIL/uL — ABNORMAL LOW (ref 3.87–5.11)
RDW: 14.1 % (ref 11.5–15.5)
WBC: 16.7 10*3/uL — AB (ref 4.0–10.5)
nRBC: 0 % (ref 0.0–0.2)

## 2018-06-29 LAB — I-STAT CG4 LACTIC ACID, ED: LACTIC ACID, VENOUS: 1.6 mmol/L (ref 0.5–1.9)

## 2018-06-29 MED ORDER — TRAZODONE HCL 50 MG PO TABS
50.0000 mg | ORAL_TABLET | Freq: Every day | ORAL | Status: DC
  Administered 2018-06-29 – 2018-07-04 (×6): 50 mg via ORAL
  Filled 2018-06-29 (×6): qty 1

## 2018-06-29 MED ORDER — DONEPEZIL HCL 10 MG PO TABS
10.0000 mg | ORAL_TABLET | Freq: Every day | ORAL | Status: DC
  Administered 2018-06-29 – 2018-07-04 (×6): 10 mg via ORAL
  Filled 2018-06-29 (×3): qty 1
  Filled 2018-06-29 (×2): qty 2
  Filled 2018-06-29 (×2): qty 1

## 2018-06-29 MED ORDER — OXYCODONE ER 18 MG PO C12A: 1.0000 | EXTENDED_RELEASE_CAPSULE | Freq: Two times a day (BID) | ORAL | Status: DC

## 2018-06-29 MED ORDER — SODIUM CHLORIDE 0.9 % IV SOLN
INTRAVENOUS | Status: DC | PRN
  Administered 2018-06-29: 250 mL via INTRAVENOUS

## 2018-06-29 MED ORDER — SODIUM CHLORIDE 0.9 % IV BOLUS (SEPSIS)
1000.0000 mL | Freq: Once | INTRAVENOUS | Status: AC
  Administered 2018-06-29: 1000 mL via INTRAVENOUS

## 2018-06-29 MED ORDER — OXYCODONE HCL 5 MG PO TABS: 15.0000 mg | ORAL_TABLET | ORAL | Status: DC

## 2018-06-29 MED ORDER — NALOXEGOL OXALATE 12.5 MG PO TABS
12.5000 mg | ORAL_TABLET | Freq: Every day | ORAL | Status: DC
  Administered 2018-06-30 – 2018-07-05 (×6): 12.5 mg via ORAL
  Filled 2018-06-29 (×6): qty 1

## 2018-06-29 MED ORDER — POTASSIUM CHLORIDE 10 MEQ/100ML IV SOLN
10.0000 meq | INTRAVENOUS | Status: AC
  Administered 2018-06-29 – 2018-06-30 (×2): 10 meq via INTRAVENOUS
  Filled 2018-06-29 (×2): qty 100

## 2018-06-29 MED ORDER — OXYCODONE HCL ER 10 MG PO T12A: 20.0000 mg | EXTENDED_RELEASE_TABLET | Freq: Two times a day (BID) | ORAL | Status: DC

## 2018-06-29 MED ORDER — SODIUM CHLORIDE 0.9 % IV SOLN
1.0000 g | INTRAVENOUS | Status: DC
  Administered 2018-06-30 – 2018-07-01 (×2): 1 g via INTRAVENOUS
  Filled 2018-06-29 (×2): qty 1

## 2018-06-29 MED ORDER — ENOXAPARIN SODIUM 40 MG/0.4ML ~~LOC~~ SOLN
40.0000 mg | SUBCUTANEOUS | Status: DC
  Administered 2018-06-29 – 2018-06-30 (×2): 40 mg via SUBCUTANEOUS
  Filled 2018-06-29 (×2): qty 0.4

## 2018-06-29 MED ORDER — ACETAMINOPHEN 500 MG PO TABS
1000.0000 mg | ORAL_TABLET | Freq: Once | ORAL | Status: AC
  Administered 2018-06-29: 1000 mg via ORAL
  Filled 2018-06-29: qty 2

## 2018-06-29 MED ORDER — IPRATROPIUM-ALBUTEROL 0.5-2.5 (3) MG/3ML IN SOLN
3.0000 mL | Freq: Four times a day (QID) | RESPIRATORY_TRACT | Status: DC
  Administered 2018-06-29: 3 mL via RESPIRATORY_TRACT
  Filled 2018-06-29 (×2): qty 3

## 2018-06-29 MED ORDER — LORAZEPAM 2 MG/ML IJ SOLN
0.5000 mg | Freq: Once | INTRAMUSCULAR | Status: AC
  Administered 2018-06-29: 0.5 mg via INTRAVENOUS
  Filled 2018-06-29: qty 1

## 2018-06-29 MED ORDER — VANCOMYCIN HCL IN DEXTROSE 1-5 GM/200ML-% IV SOLN
1000.0000 mg | Freq: Once | INTRAVENOUS | Status: AC
  Administered 2018-06-29: 1000 mg via INTRAVENOUS
  Filled 2018-06-29: qty 200

## 2018-06-29 MED ORDER — VANCOMYCIN HCL IN DEXTROSE 1-5 GM/200ML-% IV SOLN
1000.0000 mg | INTRAVENOUS | Status: DC
  Administered 2018-07-01: 1000 mg via INTRAVENOUS
  Filled 2018-06-29 (×2): qty 200

## 2018-06-29 MED ORDER — SODIUM CHLORIDE 0.9 % IV SOLN
2.0000 g | Freq: Once | INTRAVENOUS | Status: AC
  Administered 2018-06-29: 2 g via INTRAVENOUS
  Filled 2018-06-29: qty 2

## 2018-06-29 MED ORDER — FLUOXETINE HCL 20 MG PO CAPS
20.0000 mg | ORAL_CAPSULE | Freq: Every day | ORAL | Status: DC
  Administered 2018-06-30 – 2018-07-05 (×6): 20 mg via ORAL
  Filled 2018-06-29 (×6): qty 1

## 2018-06-29 MED ORDER — DULOXETINE HCL 20 MG PO CPEP
20.0000 mg | ORAL_CAPSULE | Freq: Two times a day (BID) | ORAL | Status: DC
  Administered 2018-06-30 – 2018-07-05 (×10): 20 mg via ORAL
  Filled 2018-06-29 (×12): qty 1

## 2018-06-29 MED ORDER — OXYCODONE HCL ER 20 MG PO T12A
20.0000 mg | EXTENDED_RELEASE_TABLET | Freq: Two times a day (BID) | ORAL | Status: DC
  Administered 2018-06-29 – 2018-07-05 (×12): 20 mg via ORAL
  Filled 2018-06-29 (×9): qty 1
  Filled 2018-06-29: qty 2
  Filled 2018-06-29 (×2): qty 1

## 2018-06-29 MED ORDER — IBANDRONATE SODIUM 150 MG PO TABS: 150.0000 mg | ORAL_TABLET | ORAL | Status: DC

## 2018-06-29 MED ORDER — CYCLOBENZAPRINE HCL 5 MG PO TABS
5.0000 mg | ORAL_TABLET | Freq: Three times a day (TID) | ORAL | Status: DC | PRN
  Administered 2018-06-29: 5 mg via ORAL
  Filled 2018-06-29: qty 1

## 2018-06-29 MED ORDER — SODIUM CHLORIDE 0.9 % IV BOLUS (SEPSIS)
250.0000 mL | Freq: Once | INTRAVENOUS | Status: AC
  Administered 2018-06-29: 250 mL via INTRAVENOUS

## 2018-06-29 MED ORDER — SODIUM CHLORIDE 0.9 % IV BOLUS (SEPSIS)
500.0000 mL | Freq: Once | INTRAVENOUS | Status: AC
  Administered 2018-06-29: 500 mL via INTRAVENOUS

## 2018-06-29 MED ORDER — POTASSIUM CHLORIDE 10 MEQ/100ML IV SOLN
10.0000 meq | INTRAVENOUS | Status: DC
  Administered 2018-06-29: 10 meq via INTRAVENOUS
  Filled 2018-06-29: qty 100

## 2018-06-29 NOTE — Progress Notes (Signed)
Pharmacy Antibiotic Note  Yolanda [name] is a 84 y.o. female admitted on 06/29/2018 with medical history significant of nursing home resident, dementia, CVA admitted with complaints of shortness of breath cough and fever and chills..  Pharmacy has been consulted for vancomycin and cefepime dosing for pneumonia.  Plan: Vancomycin gm IV x 1 in ED then 1gm q36h (AUC 482.4, Scr 0.8) Cefepime 2gm IV x 1 in ED then 1gm q24h Follow renal function, cultures and clinical course  Height: 5\' 3"  (160 cm) IBW/kg (Calculated) : 52.4  Temp (24hrs), Avg:99.7 F (37.6 C), Min:98.8 F (37.1 C), Max:101.4 F (38.6 C)  Recent Labs  Lab 06/26/18 2208 06/29/18 1545 06/29/18 1555  WBC 15.2*  --  16.7*  CREATININE 0.97  --  0.80  LATICACIDVEN  --  1.60  --     CrCl cannot be calculated (Unknown ideal weight.).    No Known Allergies  Antimicrobials this admission: 11/11 vanc >> 11/11 cefepime >> Dose adjustments this admission:   Microbiology results: 11/11 BCx:   Thank you for allowing pharmacy to be a part of this patient's care.  Dolly Rias RPh 06/29/2018, 6:22 PM Pager [phone]

## 2018-06-29 NOTE — Progress Notes (Signed)
PHARMACIST - PHYSICIAN COMMUNICATION  CONCERNING: P&T Medication Policy Regarding Oral Bisphosphonates  RECOMMENDATION: Your order for alendronate (Fosamax), ibandronate (Boniva), or risedronate (Actonel) has been discontinued at this time.  If the patient's post-hospital medical condition warrants safe use of this class of drugs, please resume the pre-hospital regimen upon discharge.  DESCRIPTION:  Alendronate (Fosamax), ibandronate (Boniva), and risedronate (Actonel) can cause severe esophageal erosions in patients who are unable to remain upright at least 30 minutes after taking this medication.   Since brief interruptions in therapy are thought to have minimal impact on bone mineral density, the [facility] has established that bisphosphonate orders should be routinely discontinued during hospitalization.   To override this safety policy and permit administration of Boniva, Fosamax, or Actonel in the hospital, prescribers must write "DO NOT HOLD" in the comments section when placing the order for this class of medications.  Netta Cedars, PharmD, BCPS 06/29/2018@8 :07 PM

## 2018-06-29 NOTE — ED Notes (Signed)
Pt started having increased work of breathing and treatment was given

## 2018-06-29 NOTE — ED Notes (Signed)
ED TO INPATIENT HANDOFF REPORT  Name/Age/Gender Yolanda [name] 84 y.o. female  Code Status    Code Status Orders  (From admission, onward)         Start     Ordered   06/29/18 [place]  Full code  Continuous     06/29/18 1828        Code Status History    Date Active Date Inactive Code Status Order ID Comments User Context   02/07/2017 1726 02/20/2017 1630 Full Code [id number]  Verlee Monte, MD Inpatient      Home/SNF/Other Rehab  Chief Complaint shob  Level of Care/Admitting Diagnosis ED Disposition    ED Disposition Condition [place] [facility]: [facility] [[id number]]  Level of Care: Med-Surg [16]  Diagnosis: HCAP (healthcare-associated pneumonia) [[id number]]  Admitting Physician: Georgette Shell [[id number]]  Attending Physician: Georgette Shell [[id number]]  Estimated length of stay: 3 - 4 days  Certification:: I certify this patient will need inpatient services for at least 2 midnights  PT Class (Do Not Modify): Inpatient [101]  PT Acc Code (Do Not Modify): Private [1]       Medical History Past Medical History:  Diagnosis Date  . Adenomatous colon polyp    tubular  . Anxiety disorder    panic attacks, PMH of. Dr  Janna Arch  . 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 Diverticulosis   . Fibroids    Lupron shots  . High altitude sickness 2008  . Premature menopause    due to Lupron    Allergies No Known Allergies  IV Location/Drains/Wounds Patient Lines/Drains/Airways Status   Active Line/Drains/Airways    Name:   Placement date:   Placement time:   Site:   Days:   Peripheral IV 06/29/18 Left Forearm   06/29/18    1551    Forearm   less than 1   Peripheral IV 06/29/18 Right Forearm   06/29/18    1552    Forearm   less than 1   External Urinary Catheter   06/29/18    1541    -   less than 1          Labs/Imaging Results for orders placed or performed during the  hospital encounter of 06/29/18 (from the past 48 hour(s))  I-Stat CG4 Lactic Acid, ED     Status: None   Collection Time: 06/29/18  3:45 PM  Result Value Ref Range   Lactic Acid, Venous 1.60 0.5 - 1.9 mmol/L  Comprehensive metabolic panel     Status: Abnormal   Collection Time: 06/29/18  3:55 PM  Result Value Ref Range   Sodium 139 135 - 145 mmol/L   Potassium 3.4 (L) 3.5 - 5.1 mmol/L   Chloride 101 98 - 111 mmol/L   CO2 29 22 - 32 mmol/L   Glucose, Bld 145 (H) 70 - 99 mg/dL   BUN 25 (H) 8 - 23 mg/dL   Creatinine, Ser 0.80 0.44 - 1.00 mg/dL   Calcium 9.7 8.9 - 10.3 mg/dL   Total Protein 6.7 6.5 - 8.1 g/dL   Albumin 3.7 3.5 - 5.0 g/dL   AST 30 15 - 41 U/L   ALT 22 0 - 44 U/L   Alkaline Phosphatase 61 38 - 126 U/L   Total Bilirubin 0.9 0.3 - 1.2 mg/dL   GFR calc non Af Amer >60 >60 mL/min   GFR calc Af Amer >  60 >60 mL/min    Comment: (NOTE) The eGFR has been calculated using the CKD EPI equation. This calculation has not been validated in all clinical situations. eGFR's persistently <60 mL/min signify possible Chronic Kidney Disease.    Anion gap 9 5 - 15    Comment: Performed at [facility], [place] [address], [place], [place] [address]  CBC WITH DIFFERENTIAL     Status: Abnormal   Collection Time: 06/29/18  3:55 PM  Result Value Ref Range   WBC 16.7 (H) 4.0 - 10.5 K/uL   RBC 3.55 (L) 3.87 - 5.11 MIL/uL   Hemoglobin 11.2 (L) 12.0 - 15.0 g/dL   HCT 35.8 (L) 36.0 - 46.0 %   MCV 100.8 (H) 80.0 - 100.0 fL   MCH 31.5 26.0 - 34.0 pg   MCHC 31.3 30.0 - 36.0 g/dL   RDW 14.1 11.5 - 15.5 %   Platelets 302 150 - 400 K/uL   nRBC 0.0 0.0 - 0.2 %   Neutrophils Relative % 85 %   Neutro Abs 14.2 (H) 1.7 - 7.7 K/uL   Lymphocytes Relative 5 %   Lymphs Abs 0.9 0.7 - 4.0 K/uL   Monocytes Relative 9 %   Monocytes Absolute 1.5 (H) 0.1 - 1.0 K/uL   Eosinophils Relative 0 %   Eosinophils Absolute 0.0 0.0 - 0.5 K/uL   Basophils Relative 0 %   Basophils Absolute 0.0 0.0 -  0.1 K/uL   Immature Granulocytes 1 %   Abs Immature Granulocytes 0.10 (H) 0.00 - 0.07 K/uL    Comment: Performed at [facility], [place] [address], [place], [place] [address]  Urinalysis, Routine w reflex microscopic     Status: Abnormal   Collection Time: 06/29/18  3:55 PM  Result Value Ref Range   Color, Urine AMBER (A) YELLOW    Comment: BIOCHEMICALS MAY BE AFFECTED BY COLOR   APPearance CLEAR CLEAR   Specific Gravity, Urine 1.026 1.005 - 1.030   pH 5.0 5.0 - 8.0   Glucose, UA NEGATIVE NEGATIVE mg/dL   Hgb urine dipstick NEGATIVE NEGATIVE   Bilirubin Urine NEGATIVE NEGATIVE   Ketones, ur 20 (A) NEGATIVE mg/dL   Protein, ur 30 (A) NEGATIVE mg/dL   Nitrite NEGATIVE NEGATIVE   Leukocytes, UA NEGATIVE NEGATIVE   RBC / HPF 0-5 0 - 5 RBC/hpf   WBC, UA 0-5 0 - 5 WBC/hpf   Bacteria, UA NONE SEEN NONE SEEN   Squamous Epithelial / LPF 0-5 0 - 5    Comment: Performed at [facility], [place] [address] [place],  [address]   Dg Chest 2 View  Result Date: 06/29/2018 CLINICAL DATA:  Increasing shortness of breath EXAM: CHEST - 2 VIEW COMPARISON:  05/20/2018 FINDINGS: Cardiac shadow is enlarged but stable. Stimulator leads are again seen and stable. The lungs are well aerated bilaterally. Bilateral small effusions are noted. Increasing parenchymal densities are seen bilaterally particularly in the right upper lobe and left mid lung. No significant vascular congestion is noted. No acute bony abnormality is noted. IMPRESSION: Bilateral small effusions and patchy parenchymal opacities likely representing early infiltrate. Electronically Signed   By: Inez Catalina M.D.   On: 06/29/2018 15:22   None  Pending Labs Unresulted Labs (From admission, onward)    Start     Ordered   06/30/18 0500  CBC  Tomorrow morning,   R     06/29/18 1813   06/30/18 0500  Comprehensive metabolic panel  Tomorrow  morning,   R     06/29/18 1813   06/29/18 1450  Blood Culture  (routine x 2)  BLOOD CULTURE X 2,   STAT     06/29/18 1454          Vitals/Pain Today's Vitals   06/29/18 1801 06/29/18 1818 06/29/18 1819 06/29/18 1900  BP: 139/81   (!) 156/84  Pulse: 90   94  Resp: (!) 28   (!) 34  Temp:   (!) 100.9 F (38.3 C)   TempSrc:   Rectal   SpO2: 93%   92%  Height:      PainSc:  0-No pain      Isolation Precautions No active isolations  Medications Medications  naloxegol oxalate (MOVANTIK) tablet 12.5 mg (has no administration in time range)  oxyCODONE (OXYCONTIN) 12 hr tablet 20 mg (20 mg Oral Given 06/29/18 1825)  ipratropium-albuterol (DUONEB) 0.5-2.5 (3) MG/3ML nebulizer solution 3 mL (3 mLs Nebulization Given 06/29/18 1905)  cyclobenzaprine (FLEXERIL) tablet 5 mg (has no administration in time range)  donepezil (ARICEPT) tablet 10 mg (has no administration in time range)  DULoxetine (CYMBALTA) DR capsule 20 mg (has no administration in time range)  FLUoxetine (PROZAC) capsule 20 mg (20 mg Oral Not Given 06/29/18 1823)  ibandronate (BONIVA) tablet 150 mg (150 mg Oral Not Given 06/29/18 1824)  traZODone (DESYREL) tablet 50 mg (has no administration in time range)  ceFEPIme (MAXIPIME) 1 g in sodium chloride 0.9 % 100 mL IVPB (has no administration in time range)  vancomycin (VANCOCIN) IVPB 1000 mg/200 mL premix (has no administration in time range)  potassium chloride 10 mEq in 100 mL IVPB (has no administration in time range)  enoxaparin (LOVENOX) injection 40 mg (has no administration in time range)  sodium chloride 0.9 % bolus 1,000 mL (0 mLs Intravenous Stopped 06/29/18 1748)    And  sodium chloride 0.9 % bolus 500 mL (0 mLs Intravenous Stopped 06/29/18 1820)    And  sodium chloride 0.9 % bolus 250 mL (0 mLs Intravenous Stopped 06/29/18 1820)  vancomycin (VANCOCIN) IVPB 1000 mg/200 mL premix ( Intravenous Stopped 06/29/18 1814)  ceFEPIme (MAXIPIME) 2 g in sodium chloride 0.9 % 100 mL IVPB ( Intravenous Stopped 06/29/18 1636)   acetaminophen (TYLENOL) tablet 1,000 mg (1,000 mg Oral Given 06/29/18 1558)    Mobility non-ambulatory currently

## 2018-06-29 NOTE — ED Triage Notes (Signed)
EMS reports from [facility], called out for Increased SOB. EMS reports wheezing on arrival. Seen Friday for for Diarrhea caused by taking too much Miralax and elevated WBC  BP 150/116 HR 115 RR 20 Sp02 95 w Nebulizer   5mg  Albuterol enroute

## 2018-06-29 NOTE — ED Notes (Signed)
Transport called for pt.

## 2018-06-29 NOTE — H&P (Signed)
History and Physical    Yolanda [name] MWN:[id number] DOB: Mar 09, 1934 DOA: 06/29/2018  PCP: Javier Glazier, MD Patient coming from: Nursing home  Chief Complaint: Shortness of breath and cough  HPI: Yolanda [name] is a 84 y.o. female with medical history significant of nursing home resident, dementia, CVA admitted with complaints of shortness of breath cough and fever and chills.  Patient came into the ER 2 days prior to this admission with diarrhea with dehydration and was given IV fluids and sent back.  Patient is not on oxygen at home currently she is on 4 L of oxygen.  Her saturations above 92%.  She denies any chest pain.  Denies any nausea vomiting or diarrhea or constipation at this time.  She denies any urinary complaints.    ED Course: received vancomycin and zosyn.  Temp of 101.4, blood pressure 139/81 pulse is 90 respiration 28 sats 93% on 5 L.  Sodium 139 potassium 3.4 BUN 25 creatinine 0.80 LFTs normal lactic acid 1.60.  White count 16.7 hemoglobin 11.2 platelet count 302.  Chest x-ray showed bilateral small effusions and patchy parenchymal opacities likely representing early infiltrate.  Review of Systems: See HPI.    Past Medical History:  Diagnosis Date  . Adenomatous colon polyp    tubular  . Anxiety disorder    panic attacks, PMH of. Dr  Janna Arch  . 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 Diverticulosis   . Fibroids    Lupron shots  . High altitude sickness 2008  . Premature menopause    due to Lupron    Past Surgical History:  Procedure Laterality Date  . BREAST LUMPECTOMY  1984   radiation , R breast  . CESAREAN SECTION     X 2  . colonoscopy with polypectomy  2011   Dr  Delfin Edis  . LAMINECTOMY     X3 L-Sspine  . MASTECTOMY  1990   R breast  . MASTECTOMY  1996   L    Social History   Socioeconomic History  . Marital status: Single    Spouse name: Not on file  . Number of children: Not on  file  . Years of education: Not on file  . Highest education level: Not on file  Occupational History  . Occupation: Scientist, product/process development: [employer]  . Financial resource strain: Not on file  . Food insecurity:    Worry: Not on file    Inability: Not on file  . Transportation needs:    Medical: Not on file    Non-medical: Not on file  Tobacco Use  . Smoking status: Former Smoker    Last attempt to quit: 08/19/1978    Years since quitting: 39.8  . Smokeless tobacco: Never Used  . Tobacco comment: 25 years ago as of 2012  Substance and Sexual Activity  . Alcohol use: Yes    Alcohol/week: 9.0 standard drinks    Types: 9 Glasses of wine per week    Comment: wine with dinner   . Drug use: No  . Sexual activity: Never  Lifestyle  . Physical activity:    Days per week: Not on file    Minutes per session: Not on file  . Stress: Not on file  Relationships  . Social connections:    Talks on phone: Not on file    Gets together: Not on file  Attends religious service: Not on file    Active member of club or organization: Not on file    Attends meetings of clubs or organizations: Not on file    Relationship status: Not on file  . Intimate partner violence:    Fear of current or ex partner: Not on file    Emotionally abused: Not on file    Physically abused: Not on file    Forced sexual activity: Not on file  Other Topics Concern  . Not on file  Social History Narrative   epworth sleepiness scale = 8 (11/24/15)    No Known Allergies  Family History  Problem Relation Age of Onset  . Asthma Maternal Grandmother   . Coronary artery disease Mother        dysrrhythmia  . Drug abuse Brother   . Heart Problems Father   . Cancer Neg Hx       Prior to Admission medications   Medication Sig Start Date End Date Taking? Authorizing Provider  Cholecalciferol (VITAMIN D3) 1.25 MG (50000 UT) CAPS Take 1 capsule by mouth once a week.  05/07/18  Yes [provider]  docusate (COLACE) 50 MG/5ML liquid Take 10 mLs (100 mg total) by mouth 2 (two) times daily as needed for mild constipation. 02/20/17  Yes Robbie Lis, MD  donepezil (ARICEPT) 10 MG tablet Take 10 mg by mouth at bedtime.  06/19/18  Yes [provider]  DULoxetine (CYMBALTA) 20 MG capsule Take 20 mg by mouth 2 (two) times daily.  06/19/18  Yes [provider]  famotidine (PEPCID) 20 MG tablet Take 1 tablet (20 mg total) by mouth 2 (two) times daily. 02/20/17  Yes Robbie Lis, MD  FLUoxetine (PROZAC) 20 MG capsule Take 20 mg by mouth daily.   Yes [provider]  folic acid (FOLVITE) 1 MG tablet Take 1 tablet (1 mg total) by mouth daily. 02/20/17  Yes Robbie Lis, MD  ibandronate (BONIVA) 150 MG tablet Take 150 mg by mouth every 30 (thirty) days.  06/13/18  Yes [provider]  MOVANTIK 12.5 MG TABS tablet Take 12.5 mg by mouth daily.  06/25/18  Yes [provider]  Multiple Vitamin (MULTIVITAMIN WITH MINERALS) TABS tablet Take 1 tablet by mouth daily. 02/20/17  Yes Robbie Lis, MD  naproxen sodium (ALEVE) 220 MG tablet Take 440 mg by mouth 2 (two) times daily as needed (pain).   Yes [provider]  polyethylene glycol (MIRALAX / GLYCOLAX) packet Take 17 g by mouth daily as needed for mild constipation.   Yes [provider]  thiamine 100 MG tablet Take 1 tablet (100 mg total) by mouth daily. 02/20/17  Yes Robbie Lis, MD  traZODone (DESYREL) 50 MG tablet Take 1 tablet (50 mg total) by mouth at bedtime. 02/20/17  Yes Robbie Lis, MD  XTAMPZA ER 18 MG C12A Take 1 tablet by mouth 2 (two) times daily.  06/24/18  Yes [provider]  acetaminophen (TYLENOL) 325 MG tablet Take 2 tablets (650 mg total) by mouth every 6 (six) hours as needed for mild pain (or Fever >/= 101). Patient not taking: Reported on 06/26/2018 02/20/17   Robbie Lis, MD  acetaminophen (TYLENOL) 500 MG tablet Take 1,000 mg by mouth every 6 (six)  hours as needed for moderate pain.    [provider]  albuterol (PROVENTIL) (2.5 MG/3ML) 0.083% nebulizer solution Take 3 mLs (2.5 mg total) by nebulization every 3 (three)  hours as needed for wheezing or shortness of breath. Patient not taking: Reported on 06/26/2018 02/20/17   Robbie Lis, MD  cefpodoxime (VANTIN) 200 MG tablet Take 1 tablet (200 mg total) by mouth every 12 (twelve) hours. Patient not taking: Reported on 06/26/2018 02/20/17   Robbie Lis, MD  cyclobenzaprine (FLEXERIL) 5 MG tablet Take 5 mg by mouth 3 (three) times daily as needed for muscle spasms.  03/30/18   [provider]  feeding supplement, ENSURE ENLIVE, (ENSURE ENLIVE) LIQD Take 237 mLs by mouth 2 (two) times daily between meals. Patient not taking: Reported on 06/29/2018 02/20/17   Robbie Lis, MD  guaiFENesin (ROBITUSSIN) 100 MG/5ML SOLN Take 20 mLs (400 mg total) by mouth 2 (two) times daily. Patient not taking: Reported on 06/26/2018 02/20/17   Robbie Lis, MD  ipratropium-albuterol (DUONEB) 0.5-2.5 (3) MG/3ML SOLN Take 3 mLs by nebulization 3 (three) times daily. Patient not taking: Reported on 06/29/2018 02/20/17   Robbie Lis, MD  Oxycodone HCl 10 MG TABS Take 1 tablet (10 mg total) by mouth every 6 (six) hours as needed. Patient not taking: Reported on 06/29/2018 02/20/17   Robbie Lis, MD    Physical Exam: Vitals:   06/29/18 1438 06/29/18 1541 06/29/18 1600 06/29/18 1801  BP: (!) 153/75  (!) 153/76 139/81  Pulse: (!) 107  (!) 103 90  Resp: 18  (!) 30 (!) 28  Temp: 98.8 F (37.1 C) (!) 101.4 F (38.6 C)    TempSrc: Oral Rectal    SpO2: 91%  92% 93%  Height:         . General:  Appears calm and comfortable . Eyes:  PERRL, EOMI, normal lids, iris . ENT:  grossly normal hearing, lips & tongue, mmm . Neck:  no LAD, masses or thyromegaly . Cardiovascular: RRR, no m/r/g. No LE edema.  Marland Kitchen Respiratory: Scattered rhonchi bilaterally, no w/r/r. Normal respiratory effort. . Abdomen:  soft, ntnd, NABS . Skin:  no rash or induration seen on limited exam . Musculoskeletal:  grossly normal tone BUE/BLE, good ROM, no bony abnormality . Psychiatric:  grossly normal mood and affect, speech fluent and appropriate, AOx3 . Neurologic:  CN 2-12 grossly intact, moves all extremities in coordinated fashion, sensation intact  Labs on Admission: I have personally reviewed following labs and imaging studies  CBC: Recent Labs  Lab 06/26/18 2208 06/29/18 1555  WBC 15.2* 16.7*  NEUTROABS 12.5* 14.2*  HGB 12.4 11.2*  HCT 39.3 35.8*  MCV 99.7 100.8*  PLT 345 678   Basic Metabolic Panel: Recent Labs  Lab 06/26/18 2208 06/29/18 1555  NA 140 139  K 3.9 3.4*  CL 101 101  CO2 29 29  GLUCOSE 116* 145*  BUN 19 25*  CREATININE 0.97 0.80  CALCIUM 9.9 9.7   GFR: CrCl cannot be calculated (Unknown ideal weight.). Liver Function Tests: Recent Labs  Lab 06/29/18 1555  AST 30  ALT 22  ALKPHOS 61  BILITOT 0.9  PROT 6.7  ALBUMIN 3.7   No results for input(s): LIPASE, AMYLASE in the last 168 hours. No results for input(s): AMMONIA in the last 168 hours. Coagulation Profile: No results for input(s): INR, PROTIME in the last 168 hours. Cardiac Enzymes: No results for input(s): CKTOTAL, CKMB, CKMBINDEX, TROPONINI in the last 168 hours. BNP (last 3 results) No results for input(s): PROBNP in the last 8760 hours. HbA1C: No results for input(s): HGBA1C in the last 72 hours. CBG: No results for input(s): GLUCAP  in the last 168 hours. Lipid Profile: No results for input(s): CHOL, HDL, LDLCALC, TRIG, CHOLHDL, LDLDIRECT in the last 72 hours. Thyroid Function Tests: No results for input(s): TSH, T4TOTAL, FREET4, T3FREE, THYROIDAB in the last 72 hours. Anemia Panel: No results for input(s): VITAMINB12, FOLATE, FERRITIN, TIBC, IRON, RETICCTPCT in the last 72 hours. Urine analysis:    Component Value Date/Time   COLORURINE AMBER (A) 02/07/2017 1159   APPEARANCEUR HAZY (A)  02/07/2017 1159   LABSPEC 1.020 02/07/2017 1159   PHURINE 5.0 02/07/2017 1159   GLUCOSEU NEGATIVE 02/07/2017 1159   HGBUR MODERATE (A) 02/07/2017 1159   BILIRUBINUR NEGATIVE 02/07/2017 1159   BILIRUBINUR neg 01/23/2012 1445   KETONESUR 5 (A) 02/07/2017 1159   PROTEINUR 30 (A) 02/07/2017 1159   UROBILINOGEN 0.2 01/23/2012 1445   NITRITE NEGATIVE 02/07/2017 1159   LEUKOCYTESUR NEGATIVE 02/07/2017 1159    Creatinine Clearance: CrCl cannot be calculated (Unknown ideal weight.).  Sepsis Labs: @LABRCNTIP (procalcitonin:4,lacticidven:4) )No results found for this or any previous visit (from the past 240 hour(s)).   Radiological Exams on Admission: Dg Chest 2 View  Result Date: 06/29/2018 CLINICAL DATA:  Increasing shortness of breath EXAM: CHEST - 2 VIEW COMPARISON:  05/20/2018 FINDINGS: Cardiac shadow is enlarged but stable. Stimulator leads are again seen and stable. The lungs are well aerated bilaterally. Bilateral small effusions are noted. Increasing parenchymal densities are seen bilaterally particularly in the right upper lobe and left mid lung. No significant vascular congestion is noted. No acute bony abnormality is noted. IMPRESSION: Bilateral small effusions and patchy parenchymal opacities likely representing early infiltrate. Electronically Signed   By: Inez Catalina M.D.   On: 06/29/2018 15:22    EKG: Independently reviewed.   Assessment/Plan Active Problems:   * No active hospital problems. *    1] healthcare associated pneumonia patient criteria for sepsis with tachycardia tachypnea tachycardia leukocytosis and fever.  Started on Vanco and cefepime will continue.  Follow-up blood cultures.  2] chronic pain syndrome continue Movantik and oxy Contin ER 20 mg twice a day equaling for xtampa .  3] dementia on Aricept  4] depression on Cymbalta and Prozac.  Severity of Illness: The appropriate patient status for this patient is INPATIENT. Inpatient status is judged to be  reasonable and necessary in order to provide the required intensity of service to ensure the patient's safety. The patient's presenting symptoms, physical exam findings, and initial radiographic and laboratory data in the context of their chronic comorbidities is felt to place them at high risk for further clinical deterioration. Furthermore, it is not anticipated that the patient will be medically stable for discharge from the hospital within 2 midnights of admission. The following factors support the patient status of inpatient.   " The patient's presenting symptoms include .  Fever cough shortness of breath " The worrisome physical exam findings include tachypnea tachycardia febrile leukocytosis " The initial radiographic and laboratory data are worrisome because of hypoxia dependent on 5 L of oxygen " The chronic co-morbidities include dementia, stroke   * I certify that at the point of admission it is my clinical judgment that the patient will require inpatient hospital care spanning beyond 2 midnights from the point of admission due to high intensity of service, high risk for further deterioration and high frequency of surveillance required.     Estimated body mass index is 19.91 kg/m as calculated from the following:   Height as of this encounter: 5\' 3"  (1.6 m).   Weight as of  02/20/17: 51 kg.    DVT prophylaxis: Lovenox Code Status: Full code Family Communication: Discussed with patient's son Disposition Plan: Pending clinical improvement Consults called: None Admission status: Inpatient   Georgette Shell MD [facility]  If 7PM-7AM, please contact night-coverage [email or web address] Password [facility]  06/29/2018, 6:07 PM

## 2018-06-29 NOTE — ED Provider Notes (Signed)
[place] DEPT Provider Note   CSN: [id number] Arrival date & time: 06/29/18  1327     History   Chief Complaint Chief Complaint  Patient presents with  . Shortness of Breath    HPI Yolanda [name] is a 84 y.o. female.  Yolanda [name] is a 84 y.o. Female with a history of prior brainstem stroke, uterine fibroids, anxiety, asthma, chronic pain syndrome, who presents to the emergency department from [facility] assisted living facility for evaluation of cough, shortness of breath and fevers.  Symptoms started yesterday but shortness of breath significantly increased today.  EMS was called out and noted wheezing, on arrival patient requiring 4 L nasal cannula, no oxygen requirements at baseline.  She reports cough intermittently productive of sputum.  She denies associated chest pain.  No abdominal pain, nausea or vomiting.  She has not taken anything to treat her symptoms prior to arrival, did receive 5 mg of albuterol in route with EMS which seemed to improve her breathing some.  No history of COPD.  Seen in the emergency department 2 days ago for evaluation of diarrhea and dehydration, likely due to MiraLAX use and was provided IV fluids and sent back to facility.     Past Medical History:  Diagnosis Date  . Adenomatous colon polyp    tubular  . Anxiety disorder    panic attacks, PMH of. Dr  Janna Arch  . 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 Diverticulosis   . Fibroids    Lupron shots  . High altitude sickness 2008  . Premature menopause    due to Lupron    Patient Active Problem List   Diagnosis Date Noted  . Empyema lung ([place])   . Pleural effusion on left   . Acute respiratory failure with hypoxemia ([place])   . Hypoxia   . Leukocytosis   . Severe protein-calorie malnutrition ([place])   . Acute bronchitis 02/07/2017  . ETOH abuse 02/07/2017  . Fall 02/07/2017  . Coccyx pain 02/07/2017  .  Abnormal EKG 11/24/2015  . Pre-operative cardiovascular examination 11/24/2015  . Non-compliant behavior 06/23/2015  . ARTHRALGIA 05/14/2010  . SIGMOID POLYP 10/10/2009  . CARPAL TUNNEL SYNDROME 05/24/2009  . FASTING HYPERGLYCEMIA 05/24/2009  . ASTHMA 05/19/2008  . GERD 05/19/2008  . ELEVATED BLOOD PRESSURE WITHOUT DIAGNOSIS OF HYPERTENSION 05/19/2008  . TRIGGER FINGER, RIGHT MIDDLE 10/05/2007  . 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 BREAST LUMPECTOMY  1984   radiation , R breast  . CESAREAN SECTION     X 2  . colonoscopy with polypectomy  2011   Dr  Delfin Edis  . LAMINECTOMY     X3 L-Sspine  . MASTECTOMY  1990   R breast  . MASTECTOMY  1996   L     OB History   None      Home Medications    Prior to Admission medications   Medication Sig Start Date End Date Taking? Authorizing Provider  Cholecalciferol (VITAMIN D3) 1.25 MG (50000 UT) CAPS Take 1 capsule by mouth once a week.  05/07/18  Yes [provider]  docusate (COLACE) 50 MG/5ML liquid Take 10 mLs (100 mg total) by mouth 2 (two) times daily as needed for mild constipation. 02/20/17  Yes Robbie Lis, MD  donepezil (ARICEPT) 10 MG tablet Take 10 mg by mouth at bedtime.  06/19/18  Yes [provider]  DULoxetine (CYMBALTA) 20 MG capsule Take 20 mg by mouth 2 (two) times daily.  06/19/18  Yes [provider]  famotidine (PEPCID) 20 MG tablet Take 1 tablet (20 mg total) by mouth 2 (two) times daily. 02/20/17  Yes Robbie Lis, MD  FLUoxetine (PROZAC) 20 MG capsule Take 20 mg by mouth daily.   Yes [provider]  folic acid (FOLVITE) 1 MG tablet Take 1 tablet (1 mg total) by mouth daily. 02/20/17  Yes Robbie Lis, MD  ibandronate (BONIVA) 150 MG tablet Take 150 mg by mouth every 30 (thirty) days.  06/13/18  Yes [provider]  MOVANTIK 12.5 MG TABS tablet Take 12.5 mg by mouth daily.  06/25/18  Yes [provider]  Multiple Vitamin (MULTIVITAMIN WITH MINERALS) TABS tablet Take 1 tablet by mouth daily. 02/20/17  Yes Robbie Lis, MD  naproxen sodium (ALEVE) 220 MG tablet Take 440 mg by mouth 2 (two) times daily as needed (pain).   Yes [provider]  polyethylene glycol (MIRALAX / GLYCOLAX) packet Take 17 g by mouth daily as needed for mild constipation.   Yes [provider]  thiamine 100 MG tablet Take 1 tablet (100 mg total) by mouth daily. 02/20/17  Yes Robbie Lis, MD  traZODone (DESYREL) 50 MG tablet Take 1 tablet (50 mg total) by mouth at bedtime. 02/20/17  Yes Robbie Lis, MD  XTAMPZA ER 18 MG C12A Take 1 tablet by mouth 2 (two) times daily.  06/24/18  Yes [provider]  acetaminophen (TYLENOL) 325 MG tablet Take 2 tablets (650 mg total) by mouth every 6 (six) hours as needed for mild pain (or Fever >/= 101). Patient not taking: Reported on 06/26/2018 02/20/17   Robbie Lis, MD  acetaminophen (TYLENOL) 500 MG tablet Take 1,000 mg by mouth every 6 (six) hours as needed for moderate pain.    [provider]  albuterol (PROVENTIL) (2.5 MG/3ML) 0.083% nebulizer solution Take 3 mLs (2.5 mg total) by nebulization every 3 (three) hours as needed for wheezing or shortness of breath. Patient not taking: Reported on 06/26/2018 02/20/17   Robbie Lis, MD  cefpodoxime (VANTIN) 200 MG tablet Take 1 tablet (200 mg total) by mouth every 12 (twelve) hours. Patient not taking: Reported on 06/26/2018 02/20/17   Robbie Lis, MD  cyclobenzaprine (FLEXERIL) 5 MG tablet Take 5 mg by mouth 3 (three) times daily as needed for muscle spasms.  03/30/18   [provider]  feeding supplement, ENSURE ENLIVE, (ENSURE ENLIVE) LIQD Take 237 mLs by mouth 2 (two) times daily between meals. Patient not taking: Reported on 06/29/2018 02/20/17   Robbie Lis, MD  guaiFENesin (ROBITUSSIN) 100 MG/5ML SOLN Take 20 mLs (400 mg total) by mouth 2 (two) times daily. Patient not  taking: Reported on 06/26/2018 02/20/17   Robbie Lis, MD  ipratropium-albuterol (DUONEB) 0.5-2.5 (3) MG/3ML SOLN Take 3 mLs by nebulization 3 (three) times daily. Patient not taking: Reported on 06/29/2018 02/20/17   Robbie Lis, MD  Oxycodone HCl 10 MG TABS Take 1 tablet (10 mg total) by mouth every 6 (six) hours as needed. Patient not taking: Reported on 06/29/2018 02/20/17   Robbie Lis, MD    Family History Family History  Problem Relation Age of Onset  . Asthma Maternal Grandmother   . Coronary artery disease Mother        dysrrhythmia  . Drug abuse Brother   .  Heart Problems Father   . Cancer Neg Hx     Social History Social History   Tobacco Use  . Smoking status: Former Smoker    Last attempt to quit: 08/19/1978    Years since quitting: 39.8  . Smokeless tobacco: Never Used  . Tobacco comment: 25 years ago as of 2012  Substance Use Topics  . Alcohol use: Yes    Alcohol/week: 9.0 standard drinks    Types: 9 Glasses of wine per week    Comment: wine with dinner   . Drug use: No     Allergies   Patient has no known allergies.   Review of Systems Review of Systems  Constitutional: Positive for chills and fever.  HENT: Negative.   Eyes: Negative for visual disturbance.  Respiratory: Positive for cough, shortness of breath and wheezing. Negative for chest tightness.   Cardiovascular: Negative for chest pain and leg swelling.  Gastrointestinal: Negative for abdominal pain, nausea and vomiting.  Genitourinary: Negative for dysuria.  Musculoskeletal: Negative for arthralgias and myalgias.  Neurological: Negative for dizziness, syncope and light-headedness.  All other systems reviewed and are negative.    Physical Exam Updated Vital Signs BP (!) 153/75   Pulse (!) 107   Temp (!) 101.4 F (41.7 C)   Resp 18   Ht 5\' 3"  (1.6 m)   SpO2 91%   BMI 19.91 kg/m   Physical Exam  Constitutional: She is oriented to person, place, and time. She appears  well-developed and well-nourished. No distress.  HENT:  Head: Normocephalic and atraumatic.  Mouth/Throat: Oropharynx is clear and moist.  Eyes: Right eye exhibits no discharge. Left eye exhibits no discharge.  Neck: Normal range of motion. Neck supple. No JVD present. No tracheal deviation present.  Cardiovascular: Regular rhythm, normal heart sounds and intact distal pulses. Exam reveals no gallop and no friction rub.  No murmur heard. Tachycardia  Pulmonary/Chest: No respiratory distress.  Patient mildly tachypneic with slightly increased respiratory effort, but able to speak in full sentences.  On auscultation there are scattered wheezes throughout bilateral lung fields with some decreased air movement.  Abdominal: Soft. Bowel sounds are normal. She exhibits no distension and no mass. There is no tenderness. There is no guarding.  Respirations equal and unlabored, patient able to speak in full sentences, lungs clear to auscultation bilaterally  Musculoskeletal:       Right lower leg: Normal. She exhibits no tenderness and no edema.       Left lower leg: Normal. She exhibits no tenderness and no edema.  Neurological: She is alert and oriented to person, place, and time. Coordination normal.  Skin: Skin is warm and dry. Capillary refill takes less than 2 seconds. She is not diaphoretic.  Psychiatric: She has a normal mood and affect. Her behavior is normal.  Nursing note and vitals reviewed.    ED Treatments / Results  Labs (all labs ordered are listed, but only abnormal results are displayed) Labs Reviewed  COMPREHENSIVE METABOLIC PANEL - Abnormal; Notable for the following components:      Result Value   Potassium 3.4 (*)    Glucose, Bld 145 (*)    BUN 25 (*)    All other components within normal limits  CBC WITH DIFFERENTIAL/PLATELET - Abnormal; Notable for the following components:   WBC 16.7 (*)    RBC 3.55 (*)    Hemoglobin 11.2 (*)    HCT 35.8 (*)    MCV 100.8 (*)  Neutro Abs 14.2 (*)    Monocytes Absolute 1.5 (*)    Abs Immature Granulocytes 0.10 (*)    All other components within normal limits  URINALYSIS, ROUTINE W REFLEX MICROSCOPIC - Abnormal; Notable for the following components:   Color, Urine AMBER (*)    Ketones, ur 20 (*)    Protein, ur 30 (*)    All other components within normal limits  CULTURE, BLOOD (ROUTINE X 2)  CULTURE, BLOOD (ROUTINE X 2)  CBC  COMPREHENSIVE METABOLIC PANEL  I-STAT CG4 LACTIC ACID, ED    EKG None  Radiology Dg Chest 2 View  Result Date: 06/29/2018 CLINICAL DATA:  Increasing shortness of breath EXAM: CHEST - 2 VIEW COMPARISON:  05/20/2018 FINDINGS: Cardiac shadow is enlarged but stable. Stimulator leads are again seen and stable. The lungs are well aerated bilaterally. Bilateral small effusions are noted. Increasing parenchymal densities are seen bilaterally particularly in the right upper lobe and left mid lung. No significant vascular congestion is noted. No acute bony abnormality is noted. IMPRESSION: Bilateral small effusions and patchy parenchymal opacities likely representing early infiltrate. Electronically Signed   By: Inez Catalina M.D.   On: 06/29/2018 15:22    Procedures .Critical Care Performed by: Jacqlyn Larsen, PA-C Authorized by: Jacqlyn Larsen, PA-C   Critical care provider statement:    Critical care time (minutes):  45   Critical care time was exclusive of:  Separately billable procedures and treating other patients   Critical care was necessary to treat or prevent imminent or life-threatening deterioration of the following conditions:  Respiratory failure and sepsis   Critical care was time spent personally by me on the following activities:  Discussions with consultants, evaluation of patient's response to treatment, examination of patient, ordering and performing treatments and interventions, ordering and review of laboratory studies, ordering and review of radiographic studies, pulse  oximetry, re-evaluation of patient's condition, obtaining history from patient or surrogate and review of old charts   I assumed direction of critical care for this patient from another provider in my specialty: no     (including critical care time)  Medications Ordered in ED Medications  0.9 %  sodium chloride infusion (250 mLs Intravenous New Bag/Given 06/29/18 2010)  potassium chloride 10 mEq in 100 mL IVPB (10 mEq Intravenous New Bag/Given 06/29/18 2143)  sodium chloride 0.9 % bolus 1,000 mL (0 mLs Intravenous Stopped 06/29/18 1748)    And  sodium chloride 0.9 % bolus 500 mL (0 mLs Intravenous Stopped 06/29/18 1820)    And  sodium chloride 0.9 % bolus 250 mL (0 mLs Intravenous Stopped 06/29/18 1820)  vancomycin (VANCOCIN) IVPB 1000 mg/200 mL premix ( Intravenous Stopped 06/29/18 1814)  ceFEPIme (MAXIPIME) 2 g in sodium chloride 0.9 % 100 mL IVPB ( Intravenous Stopped 06/29/18 1636)  acetaminophen (TYLENOL) tablet 1,000 mg (1,000 mg Oral Given 06/29/18 1558)     Initial Impression / Assessment and Plan / ED Course  I have reviewed the triage vital signs and the nursing notes.  Pertinent labs & imaging results that were available during my care of the patient were reviewed by me and considered in my medical decision making (see chart for details).  Presents to the emergency department for evaluation of 2 days of worsening shortness of breath and cough.  On arrival patient hypoxic to 89 on room air after nebulizer, and placed on 4 L nasal cannula.  Tachycardic, and febrile to 101.4,  Hyper- normotensive, mildly tachypneic.  On exam she  has scattered wheezes and some decreased breath sounds throughout bilateral lung fields.  At this time patient meets sepsis criteria with concern for pneumonia as source.  Code sepsis initiated, patient started on 30 cc/kg fluid bolus and started on cefepime and Zosyn to treat for healthcare associated pneumonia since patient resides in skilled nursing  facility.  Lactic acid is not significantly elevated and blood cultures collected, cytosis of 16.7 with left shift, no acute electrolyte derangements requiring intervention, normal renal and liver function.  Analysis without signs of infection.  Chest x-ray shows multiple patchy infiltrates, likely an early multifocal pneumonia.  Patient continues to require 4 L nasal cannula, feel she required admission for continued IV antibiotics given pneumonia with sepsis.  Discussed with Dr. Zigmund Daniel with [facility] who will see and admit the patient.  Patient discussed with Dr. Melina Copa, who saw patient as well and agrees with plan.  Final Clinical Impressions(s) / ED Diagnoses   Final diagnoses:  HCAP (healthcare-associated pneumonia)  Sepsis due to pneumonia [facility])  Hypoxia    ED Discharge Orders    None       Janet Berlin 06/30/18 0007    Hayden Rasmussen, MD 06/30/18 [phone]

## 2018-06-29 NOTE — ED Notes (Signed)
Bed: WA15 Expected date:  Expected time:  Means of arrival:  Comments: EMS-SOB 

## 2018-06-29 NOTE — Progress Notes (Signed)
A consult was received from an ED physician for vancomycin and cefepime per pharmacy dosing.  The patient's profile has been reviewed for ht/wt/allergies/indication/available labs.   A one time order has been placed for cefepime 2gm and vancomycin 1gm.    Further antibiotics/pharmacy consults should be ordered by admitting physician if indicated.                       Thank you, Dolly Rias RPh 06/29/2018, 3:01 PM Pager [phone]

## 2018-06-30 LAB — CBC
HEMATOCRIT: 31.2 % — AB (ref 36.0–46.0)
HEMOGLOBIN: 9.6 g/dL — AB (ref 12.0–15.0)
MCH: 32.1 pg (ref 26.0–34.0)
MCHC: 30.8 g/dL (ref 30.0–36.0)
MCV: 104.3 fL — AB (ref 80.0–100.0)
Platelets: 250 10*3/uL (ref 150–400)
RBC: 2.99 MIL/uL — ABNORMAL LOW (ref 3.87–5.11)
RDW: 14.4 % (ref 11.5–15.5)
WBC: 12.9 10*3/uL — ABNORMAL HIGH (ref 4.0–10.5)
nRBC: 0 % (ref 0.0–0.2)

## 2018-06-30 LAB — COMPREHENSIVE METABOLIC PANEL
ALBUMIN: 2.7 g/dL — AB (ref 3.5–5.0)
ALT: 19 U/L (ref 0–44)
ANION GAP: 4 — AB (ref 5–15)
AST: 24 U/L (ref 15–41)
Alkaline Phosphatase: 48 U/L (ref 38–126)
BILIRUBIN TOTAL: 0.8 mg/dL (ref 0.3–1.2)
BUN: 24 mg/dL — ABNORMAL HIGH (ref 8–23)
CALCIUM: 8.7 mg/dL — AB (ref 8.9–10.3)
CO2: 29 mmol/L (ref 22–32)
Chloride: 106 mmol/L (ref 98–111)
Creatinine, Ser: 0.77 mg/dL (ref 0.44–1.00)
GLUCOSE: 105 mg/dL — AB (ref 70–99)
POTASSIUM: 3.8 mmol/L (ref 3.5–5.1)
Sodium: 139 mmol/L (ref 135–145)
TOTAL PROTEIN: 5.5 g/dL — AB (ref 6.5–8.1)

## 2018-06-30 MED ORDER — ALBUTEROL SULFATE (2.5 MG/3ML) 0.083% IN NEBU
2.5000 mg | INHALATION_SOLUTION | Freq: Four times a day (QID) | RESPIRATORY_TRACT | Status: DC
  Administered 2018-06-30 – 2018-07-03 (×10): 2.5 mg via RESPIRATORY_TRACT
  Filled 2018-06-30 (×12): qty 3

## 2018-06-30 MED ORDER — ALBUTEROL SULFATE (2.5 MG/3ML) 0.083% IN NEBU: 2.5000 mg | INHALATION_SOLUTION | RESPIRATORY_TRACT | Status: DC | PRN

## 2018-06-30 MED ORDER — IPRATROPIUM-ALBUTEROL 0.5-2.5 (3) MG/3ML IN SOLN
3.0000 mL | Freq: Three times a day (TID) | RESPIRATORY_TRACT | Status: DC
  Administered 2018-06-30: 3 mL via RESPIRATORY_TRACT
  Filled 2018-06-30: qty 3

## 2018-06-30 MED ORDER — LORAZEPAM 2 MG/ML IJ SOLN
0.5000 mg | Freq: Four times a day (QID) | INTRAMUSCULAR | Status: DC | PRN
  Administered 2018-06-30 – 2018-07-04 (×5): 0.5 mg via INTRAVENOUS
  Filled 2018-06-30 (×6): qty 1

## 2018-06-30 NOTE — Progress Notes (Signed)
Pt is increasingly confused/anxious. Unable to tell me her name. Pt caregiver states pts mental status has noticeably declined since arriving here yesterday.   Sats were ranging in the 80's on 4L [place]. Turned up to 5L and sats increased to low 90's.    Pt states trouble swallowing. Caregiver states this began this morning.   On call dr paged and notified.

## 2018-06-30 NOTE — Progress Notes (Signed)
PROGRESS NOTE    Yolanda [name]  JOA:[id number] DOB: [birth year] DOA: 06/29/2018 PCP: Javier Glazier, MD Brief Narrative:  84 y.o. female with medical history significant of nursing home resident, dementia, CVA admitted with complaints of shortness of breath cough and fever and chills.  Patient came into the ER 2 days prior to this admission with diarrhea with dehydration and was given IV fluids and sent back.  Patient is not on oxygen at home currently she is on 4 L of oxygen.  Her saturations above 92%.  She denies any chest pain.  Denies any nausea vomiting or diarrhea or constipation at this time.  She denies any urinary complaints.    ED Course: received vancomycin and zosyn.  Temp of 101.4, blood pressure 139/81 pulse is 90 respiration 28 sats 93% on 5 L.  Sodium 139 potassium 3.4 BUN 25 creatinine 0.80 LFTs normal lactic acid 1.60.  White count 16.7 hemoglobin 11.2 platelet count 302.  Chest x-ray showed bilateral small effusions and patchy parenchymal opacities likely representing early infiltrate. Assessment & Plan:   Active Problems:   HCAP (healthcare-associated pneumonia)   Dementia without behavioral disturbance ([place])   Hypokalemia   #1 acute hypoxic respiratory failure secondary to healthcare associated pneumonia patient admitted with fever leukocytosis tachycardia and tachypnea fever and shortness of breath.  Patient lives in assisted living facility.  She was started on Vanco and cefepime.  MRSA PCR pending.  She is still dependent on 4 to 5 L of oxygen this morning.  She had a temp of 101.4 on admission and now is afebrile.  Leukocytosis improved from 16.7-12.9.  Follow final blood cultures.  Out of bed as tolerated.  PT consult.  Nebulizer treatments continued.  This patient needs ongoing treatment with IV antibiotics since she is persistently hypoxic though she has improved since yesterday.  #2 chronic pain syndrome patient restarted on Movantik and OxyContin ER instead  of extent but since we do not have that.  #3 dementia with behaviors on Aricept she is requiring Ativan intermittently as she is getting agitated and pulling out IV lines and oxygen off.  #4 depression continue Cymbalta and Prozac.      Estimated body mass index is 19.91 kg/m as calculated from the following:   Height as of this encounter: 5\' 3"  (1.6 m).   Weight as of 02/20/17: 51 kg.  DVT prophylaxis: Lovenox Code Status: Full code Family Communication discussed with caregiver in the room Disposition Plan: Pending clinical improvement  Consultants: None   Procedures: None Antimicrobials: Vanco and cefepime  Subjective: Patient resting in bed does not appear to be in any distress mild confusion noted not able to follow commands and she is extremely hard of hearing.   Objective: Vitals:   06/30/18 0637 06/30/18 0857 06/30/18 0901 06/30/18 0902  BP: (!) 153/75     Pulse: (!) 101     Resp: 20     Temp: 98.7 F (37.1 C)     TempSrc: Oral     SpO2: 99% 100% 100% 98%  Height:        Intake/Output Summary (Last 24 hours) at 06/30/2018 0921 Last data filed at 06/30/2018 6010 Gross per 24 hour  Intake 2726.08 ml  Output 150 ml  Net 2576.08 ml   There were no vitals filed for this visit.  Examination:  General exam: Appears calm and comfortable  Respiratory system: Scattered rhonchi bilaterally auscultation. Respiratory effort normal. Cardiovascular system: S1 & S2 heard, RRR. No JVD,  murmurs, rubs, gallops or clicks. No pedal edema. Gastrointestinal system: Abdomen is nondistended, soft and nontender. No organomegaly or masses felt. Normal bowel sounds heard. Central nervous system: Confusion noted.  She is awake not oriented to place. Extremities: Symmetric 5 x 5 power. Skin: No rashes, lesions or ulcers     Data Reviewed: I have personally reviewed following labs and imaging studies  CBC: Recent Labs  Lab 06/26/18 2208 06/29/18 1555 06/30/18 0316  WBC  15.2* 16.7* 12.9*  NEUTROABS 12.5* 14.2*  --   HGB 12.4 11.2* 9.6*  HCT 39.3 35.8* 31.2*  MCV 99.7 100.8* 104.3*  PLT 345 302 010   Basic Metabolic Panel: Recent Labs  Lab 06/26/18 2208 06/29/18 1555 06/30/18 0316  NA 140 139 139  K 3.9 3.4* 3.8  CL 101 101 106  CO2 29 29 29   GLUCOSE 116* 145* 105*  BUN 19 25* 24*  CREATININE 0.97 0.80 0.77  CALCIUM 9.9 9.7 8.7*   GFR: CrCl cannot be calculated (Unknown ideal weight.). Liver Function Tests: Recent Labs  Lab 06/29/18 1555 06/30/18 0316  AST 30 24  ALT 22 19  ALKPHOS 61 48  BILITOT 0.9 0.8  PROT 6.7 5.5*  ALBUMIN 3.7 2.7*   No results for input(s): LIPASE, AMYLASE in the last 168 hours. No results for input(s): AMMONIA in the last 168 hours. Coagulation Profile: No results for input(s): INR, PROTIME in the last 168 hours. Cardiac Enzymes: No results for input(s): CKTOTAL, CKMB, CKMBINDEX, TROPONINI in the last 168 hours. BNP (last 3 results) No results for input(s): PROBNP in the last 8760 hours. HbA1C: No results for input(s): HGBA1C in the last 72 hours. CBG: No results for input(s): GLUCAP in the last 168 hours. Lipid Profile: No results for input(s): CHOL, HDL, LDLCALC, TRIG, CHOLHDL, LDLDIRECT in the last 72 hours. Thyroid Function Tests: No results for input(s): TSH, T4TOTAL, FREET4, T3FREE, THYROIDAB in the last 72 hours. Anemia Panel: No results for input(s): VITAMINB12, FOLATE, FERRITIN, TIBC, IRON, RETICCTPCT in the last 72 hours. Sepsis Labs: Recent Labs  Lab 06/29/18 1545  LATICACIDVEN 1.60    No results found for this or any previous visit (from the past 240 hour(s)).       Radiology Studies: Dg Chest 2 View  Result Date: 06/29/2018 CLINICAL DATA:  Increasing shortness of breath EXAM: CHEST - 2 VIEW COMPARISON:  05/20/2018 FINDINGS: Cardiac shadow is enlarged but stable. Stimulator leads are again seen and stable. The lungs are well aerated bilaterally. Bilateral small effusions are  noted. Increasing parenchymal densities are seen bilaterally particularly in the right upper lobe and left mid lung. No significant vascular congestion is noted. No acute bony abnormality is noted. IMPRESSION: Bilateral small effusions and patchy parenchymal opacities likely representing early infiltrate. Electronically Signed   By: Inez Catalina M.D.   On: 06/29/2018 15:22        Scheduled Meds: . donepezil  10 mg Oral QHS  . DULoxetine  20 mg Oral BID  . enoxaparin (LOVENOX) injection  40 mg Subcutaneous Q24H  . FLUoxetine  20 mg Oral Daily  . ipratropium-albuterol  3 mL Nebulization TID  . naloxegol oxalate  12.5 mg Oral Daily  . oxyCODONE  20 mg Oral Q12H  . traZODone  50 mg Oral QHS   Continuous Infusions: . sodium chloride 10 mL/hr at 06/30/18 0623  . ceFEPime (MAXIPIME) IV    . [START ON 07/01/2018] vancomycin       LOS: 1 day  Georgette Shell, MD [facility]  If 7PM-7AM, please contact night-coverage [email or web address] Password [email or web address] 06/30/2018, 9:21 AM

## 2018-07-01 ENCOUNTER — Inpatient Hospital Stay (HOSPITAL_COMMUNITY): Payer: Commercial Managed Care - PPO

## 2018-07-01 ENCOUNTER — Encounter (HOSPITAL_COMMUNITY): Payer: Self-pay | Admitting: Pulmonary Disease

## 2018-07-01 DIAGNOSIS — R918 Other nonspecific abnormal finding of lung field: Secondary | ICD-10-CM

## 2018-07-01 DIAGNOSIS — R1310 Dysphagia, unspecified: Secondary | ICD-10-CM

## 2018-07-01 DIAGNOSIS — J810 Acute pulmonary edema: Secondary | ICD-10-CM

## 2018-07-01 DIAGNOSIS — I340 Nonrheumatic mitral (valve) insufficiency: Secondary | ICD-10-CM

## 2018-07-01 DIAGNOSIS — J189 Pneumonia, unspecified organism: Secondary | ICD-10-CM

## 2018-07-01 DIAGNOSIS — J84112 Idiopathic pulmonary fibrosis: Secondary | ICD-10-CM

## 2018-07-01 DIAGNOSIS — I351 Nonrheumatic aortic (valve) insufficiency: Secondary | ICD-10-CM

## 2018-07-01 LAB — CBC
HEMATOCRIT: 32.7 % — AB (ref 36.0–46.0)
Hemoglobin: 9.8 g/dL — ABNORMAL LOW (ref 12.0–15.0)
MCH: 31.6 pg (ref 26.0–34.0)
MCHC: 30 g/dL (ref 30.0–36.0)
MCV: 105.5 fL — ABNORMAL HIGH (ref 80.0–100.0)
Platelets: 258 10*3/uL (ref 150–400)
RBC: 3.1 MIL/uL — ABNORMAL LOW (ref 3.87–5.11)
RDW: 14.2 % (ref 11.5–15.5)
WBC: 11 10*3/uL — AB (ref 4.0–10.5)
nRBC: 0 % (ref 0.0–0.2)

## 2018-07-01 LAB — COMPREHENSIVE METABOLIC PANEL
ALBUMIN: 2.9 g/dL — AB (ref 3.5–5.0)
ALT: 30 U/L (ref 0–44)
ANION GAP: 5 (ref 5–15)
AST: 28 U/L (ref 15–41)
Alkaline Phosphatase: 62 U/L (ref 38–126)
BILIRUBIN TOTAL: 1 mg/dL (ref 0.3–1.2)
BUN: 19 mg/dL (ref 8–23)
CO2: 30 mmol/L (ref 22–32)
Calcium: 9.1 mg/dL (ref 8.9–10.3)
Chloride: 105 mmol/L (ref 98–111)
Creatinine, Ser: 0.68 mg/dL (ref 0.44–1.00)
GFR calc Af Amer: >60 mL/min (ref >60)
GFR calc non Af Amer: >60 mL/min (ref >60)
GLUCOSE: 102 mg/dL — AB (ref 70–99)
POTASSIUM: 3.9 mmol/L (ref 3.5–5.1)
Sodium: 140 mmol/L (ref 135–145)
TOTAL PROTEIN: 5.7 g/dL — AB (ref 6.5–8.1)

## 2018-07-01 LAB — SEDIMENTATION RATE: SED RATE: 63 mm/h — AB (ref 0–22)

## 2018-07-01 LAB — ECHOCARDIOGRAM COMPLETE: Height: 63 in

## 2018-07-01 LAB — BRAIN NATRIURETIC PEPTIDE: B Natriuretic Peptide: 862.1 pg/mL — ABNORMAL HIGH (ref 0.0–100.0)

## 2018-07-01 LAB — PROCALCITONIN: Procalcitonin: 0.11 ng/mL

## 2018-07-01 MED ORDER — ALBUTEROL SULFATE (2.5 MG/3ML) 0.083% IN NEBU: 2.5000 mg | INHALATION_SOLUTION | RESPIRATORY_TRACT | Status: DC | PRN

## 2018-07-01 MED ORDER — PHENYLEPHRINE HCL 0.5 % NA SOLN
2.0000 [drp] | NASAL | Status: DC | PRN
  Administered 2018-07-01: 2 [drp] via NASAL
  Filled 2018-07-01: qty 15

## 2018-07-01 MED ORDER — ALBUTEROL SULFATE (2.5 MG/3ML) 0.083% IN NEBU: 2.5000 mg | INHALATION_SOLUTION | Freq: Four times a day (QID) | RESPIRATORY_TRACT | Status: DC

## 2018-07-01 MED ORDER — DOCUSATE SODIUM 100 MG PO CAPS
100.0000 mg | ORAL_CAPSULE | Freq: Two times a day (BID) | ORAL | Status: DC
  Administered 2018-07-01 – 2018-07-05 (×9): 100 mg via ORAL
  Filled 2018-07-01 (×9): qty 1

## 2018-07-01 NOTE — Progress Notes (Signed)
RT came to administer 0800 breathing tx; patient requested to eat at this time and no distress noted. RT will continue to monitor patient.

## 2018-07-01 NOTE — Progress Notes (Addendum)
PROGRESS NOTE    Yolanda [name]  BMW:[id number] DOB: 12/11/1933 DOA: 06/29/2018 PCP: Javier Glazier, MD   Brief Narrative:  84 y.o. female from [facility] independent living facility with past medical history of dementia, CVA was admitted to hospital with complaints of shortness of breath, cough, fever and chills.  Patient came into the ER 2 days prior to this admission with diarrhea with dehydration and was given IV fluids and sent back to the facility.  Patient is not on oxygen at home and has been requiring 4 to 5 L with oxygen saturation of 94%.    In the ED, received vancomycin and zosyn.  Temp of 101.4, blood pressure 139/81 pulse is 90 respiration 28 sats 93% on 5 L.  Sodium 139 potassium 3.4 BUN 25 creatinine 0.80 LFTs normal lactic acid 1.60.  White count 16.7 hemoglobin 11.2 platelet count 302.  Chest x-ray showed bilateral small effusions and patchy parenchymal opacities likely representing early infiltrate.   Patient was then admitted to the hospital and following conditions were addressed during hospitalization.  Assessment & Plan:   Active Problems:   HCAP (healthcare-associated pneumonia)   Dementia without behavioral disturbance ([place])   Hypokalemia  Acute hypoxic respiratory failure secondary to bilateral pneumonia.  Patient had a fever, leukocytosis, tachycardia and tachypnea on presentation.  Patient is from independent living facility..  On vancomycin and cefepime, will continue with that.  Patient is still requiring 4 to 5 L of oxygen. T-max of 98.3 F.  leukocytosis has trended down.  Continue to follow blood cultures.  Physical therapy has been consulted.  Continue on nebulizer treatment.  Seen by PCCM.  Will follow recommendations.  Will likely need home oxygen on discharge.  2D echocardiogram.  Pro-calcitonin of 0.1.  BNP 862.  Pulmonary recommending CT chest without contrast.  Chronic pain syndrome  on Movantik and OxyContin ER    Dementia with behavioral  issues Continue on Aricept.    Hypokalemia.  Replenished and improved.  History of depression  continue on Cymbalta and Prozac.  DVT prophylaxis: Lovenox  Code Status: Full code  Family Communication: I spoke with the patient's caregiver at bedside and the son phone and updated them about the clinical condition of the patient and further plans.  Disposition Plan:  ALF/SNF pending clinical improvement.  Likely in 2 to 3 days.  Continue to wean oxygen if possible.  Consultants: PCCM  Procedures: None  Antimicrobials: Vanco and cefepime 11/11>  Subjective:   Patient states that she feels okay.  She does have some cough and dyspnea.  Denies any nausea vomiting.  Has not had a bowel movement in couple of days.  States that she has difficulty swallowing.  Objective: Vitals:   06/30/18 2055 06/30/18 2130 06/30/18 2200 07/01/18 0415  BP: (!) 150/75   140/73  Pulse: (!) 102   94  Resp: 16   (!) 22  Temp: 97.7 F (36.5 C)   98.2 F (36.8 C)  TempSrc: Oral   Oral  SpO2: 95% (!) 87% 93% 92%  Height:        Intake/Output Summary (Last 24 hours) at 07/01/2018 0936 Last data filed at 07/01/2018 0900 Gross per 24 hour  Intake 337.32 ml  Output 475 ml  Net -137.68 ml   Vitals:   06/30/18 2200 07/01/18 0415  BP:  140/73  Pulse:  94  Resp:  (!) 22  Temp:  98.2 F (36.8 C)  SpO2: 93% 92%   Physical Examination:  General exam:  Appears calm and comfortable ,Not in distress.  Nasal cannula 5 L/min. HEENT:PERRL,Oral mucosa moist Respiratory system: Scattered rhonchi bilaterally with diminished breath sounds. Cardiovascular system: S1 & S2 heard, RRR.  Gastrointestinal system: Abdomen is nondistended, soft and nontender. No organomegaly or masses felt. Normal bowel sounds heard.  External urinary catheter in place. Central nervous system: Awake and communicative.  Disoriented Dementia..  No focal neurological deficits. Extremities: No edema, no clubbing ,no cyanosis, distal  peripheral pulses palpable. Skin: No rashes, lesions or ulcers,no icterus ,no pallor MSK: Normal muscle bulk,tone ,power  Data Reviewed: I have personally reviewed following labs and imaging studies  CBC: Recent Labs  Lab 06/26/18 2208 06/29/18 1555 06/30/18 0316 07/01/18 0324  WBC 15.2* 16.7* 12.9* 11.0*  NEUTROABS 12.5* 14.2*  --   --   HGB 12.4 11.2* 9.6* 9.8*  HCT 39.3 35.8* 31.2* 32.7*  MCV 99.7 100.8* 104.3* 105.5*  PLT 345 302 250 585   Basic Metabolic Panel: Recent Labs  Lab 06/26/18 2208 06/29/18 1555 06/30/18 0316 07/01/18 0324  NA 140 139 139 140  K 3.9 3.4* 3.8 3.9  CL 101 101 106 105  CO2 29 29 29 30   GLUCOSE 116* 145* 105* 102*  BUN 19 25* 24* 19  CREATININE 0.97 0.80 0.77 0.68  CALCIUM 9.9 9.7 8.7* 9.1   GFR: CrCl cannot be calculated (Unknown ideal weight.). Liver Function Tests: Recent Labs  Lab 06/29/18 1555 06/30/18 0316 07/01/18 0324  AST 30 24 28   ALT 22 19 30   ALKPHOS 61 48 62  BILITOT 0.9 0.8 1.0  PROT 6.7 5.5* 5.7*  ALBUMIN 3.7 2.7* 2.9*   No results for input(s): LIPASE, AMYLASE in the last 168 hours. No results for input(s): AMMONIA in the last 168 hours. Coagulation Profile: No results for input(s): INR, PROTIME in the last 168 hours. Cardiac Enzymes: No results for input(s): CKTOTAL, CKMB, CKMBINDEX, TROPONINI in the last 168 hours. BNP (last 3 results) No results for input(s): PROBNP in the last 8760 hours. HbA1C: No results for input(s): HGBA1C in the last 72 hours. CBG: No results for input(s): GLUCAP in the last 168 hours. Lipid Profile: No results for input(s): CHOL, HDL, LDLCALC, TRIG, CHOLHDL, LDLDIRECT in the last 72 hours. Thyroid Function Tests: No results for input(s): TSH, T4TOTAL, FREET4, T3FREE, THYROIDAB in the last 72 hours. Anemia Panel: No results for input(s): VITAMINB12, FOLATE, FERRITIN, TIBC, IRON, RETICCTPCT in the last 72 hours. Sepsis Labs: Recent Labs  Lab 06/29/18 1545  LATICACIDVEN 1.60     Recent Results (from the past 240 hour(s))  Blood Culture (routine x 2)     Status: None (Preliminary result)   Collection Time: 06/29/18  3:52 PM  Result Value Ref Range Status   Specimen Description   Final    BLOOD LEFT FOREARM Performed at [facility], [place] [address], [place], [place] [address]    Special Requests   Final    BOTTLES DRAWN AEROBIC AND ANAEROBIC Blood Culture adequate volume Performed at [facility] [address], [place], [place] [address]    Culture   Final    NO GROWTH 2 DAYS Performed at [facility] [address], [place], [place] [address]    Report Status PENDING  Incomplete  Blood Culture (routine x 2)     Status: None (Preliminary result)   Collection Time: 06/29/18  3:55 PM  Result Value Ref Range Status   Specimen Description   Final    BLOOD  RIGHT FOREARM Performed at [facility], [place] [address] [place], [place] [address]    Special Requests   Final    BOTTLES DRAWN AEROBIC ONLY Blood Culture adequate volume Performed at [facility] [address] [place], [place] [address]    Culture   Final    NO GROWTH 2 DAYS Performed at [facility] [address], [place], [place] [address]    Report Status PENDING  Incomplete      Radiology Studies: Dg Chest 2 View  Result Date: 06/29/2018 CLINICAL DATA:  Increasing shortness of breath EXAM: CHEST - 2 VIEW COMPARISON:  05/20/2018 FINDINGS: Cardiac shadow is enlarged but stable. Stimulator leads are again seen and stable. The lungs are well aerated bilaterally. Bilateral small effusions are noted. Increasing parenchymal densities are seen bilaterally particularly in the right upper lobe and left mid lung. No significant vascular congestion is noted. No acute bony abnormality is noted. IMPRESSION: Bilateral small effusions and patchy parenchymal opacities likely representing early infiltrate.  Electronically Signed   By: Inez Catalina M.D.   On: 06/29/2018 15:22     Scheduled Meds: . albuterol  2.5 mg Nebulization QID  . donepezil  10 mg Oral QHS  . DULoxetine  20 mg Oral BID  . enoxaparin (LOVENOX) injection  40 mg Subcutaneous Q24H  . FLUoxetine  20 mg Oral Daily  . naloxegol oxalate  12.5 mg Oral Daily  . oxyCODONE  20 mg Oral Q12H  . traZODone  50 mg Oral QHS   Continuous Infusions: . sodium chloride Stopped (06/30/18 0918)  . ceFEPime (MAXIPIME) IV 1 g (06/30/18 1937)  . vancomycin 1,000 mg (07/01/18 0409)     LOS: 2 days    Flora Lipps, MD [facility]  If 7PM-7AM, please contact night-coverage [email or web address] Password [facility] 07/01/2018, 9:36 AM

## 2018-07-01 NOTE — Progress Notes (Signed)
Order for swallow evaluation received, RN reports pt taking medications without swallowing difficulties and reports pt now has a nose bleed.  Will follow up next date am for swallow evaluation.   Luanna Salk, [facility] SLP Acute Rehab Services Pager [phone] Office [phone]

## 2018-07-01 NOTE — Consult Note (Addendum)
NAME:  Yolanda [name], MRN:  [id number], DOB:  05-Dec-1933, LOS: 2 ADMISSION DATE:  06/29/2018, CONSULTATION DATE:  07/01/18 REFERRING MD:  Dr. Zigmund Daniel / [facility], CHIEF COMPLAINT:  Shortness of Breath   Brief History   84 y/o F who presented to [facility] ER on 11/11 with reports of SOB from her Assisted Living. She had recently been seen (11/8) in the ER for diarrhea related to too much Miralax.   The patient was febrile to 101.4, tachycardic with elevated WBC (16.7) on presentation.  Admitted for sepsis physiology with possible pulmonary source with patchy parenchymal opacities.    History of present illness   84 y/o F, former smoker (quit ~ 30 years ago, 1ppd smoker) who presented to [facility] ER on 11/11 with reports of SOB from her Assisted Living. She had recently been seen (11/8) in the ER for diarrhea related to too much Miralax (pt accidentally drank the whole bottle). At that time she was treated with IVF and sent back to the SNF.    She returned to the ER on 11/11 with reports of shortness of breath that began on the same day.  Her family reports she has been having increased SOB for the last two months.  She has to stop 2-3 times from her room to the dining room to rest and catch her breath. Additionally, she has been noted to have audible wheezing particularly when she eats.     On admit the patient was febrile to 101.4, tachycardic with elevated WBC (16.7). Admitted for sepsis physiology with possible pulmonary source with patchy parenchymal opacities.  The patient was pan cultured and treated with IV antibiotics empirically for suspected HCAP.  She developed increasing confusion and worsening hypoxemia with saturations to the 87 on 4L.  O2 increased to 5L late PM (11/12).    PCCM called for evaluation am 11/13.    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 rate (!) 22, height '5\' 3"'$  (1.6 m), SpO2 92 %.        Intake/Output Summary (Last 24 hours) at 07/01/2018 0907 Last data filed at 07/01/2018 0900 Gross per 24 hour  Intake 337.32 ml  Output 475 ml  Net -137.68 ml   There were no vitals filed for this visit.  Examination: General: frail elderly female lying in bed HEENT: MM pink/moist, nose bleeding upon entering the room  Neuro: Awake, alert, speech clear, mild short memory deficits but long term memory appears to be intact CV: s1s2 rrr, no m/r/g PULM: even/non-labored, lungs bilaterally with bibasilar crackles  LJ:QGBE, non-tender, bsx4 active  Extremities: warm/dry, no edema  Skin: no rashes, RLE   Resolved Hospital Problem list     Assessment & Plan:   Acute Hypoxemic Respiratory Failure  -in setting of suspected HCAP, possible aspiration and suspected underlying emphysema P: Wean O2 for sats 88-95% Assess CT chest without contrast to assess infiltrates / scarring  Pulmonary hygiene- IS, mobilize  PT consult  Assess SLP  evaluation > noted she eats very quickly and large volume at a time as well / may be related to dementia    Pulmonary Infiltrates -suspected HCAP, possible aspiration  P: Abx as above  CT imaging as above  Follow fever curve / WBC trend  Minimize narcotics as able as may contribute to aspiration risk  Assess BNP, ESR   Sepsis  -suspected pulmonary source P: Continue IV antibiotics as ordered Assess PCT  Pending review, consider discontinue vancomycin   Agitated Delirium superimposed on Dementia  P: Limit sedating medications as  able     Best practice:  Diet: As tolerated Pain/Anxiety/Delirium protocol (if indicated): n/a VAP protocol (if indicated): n/a DVT prophylaxis:  GI prophylaxis: n/a Glucose control: per primary  Mobility: As tolerated  Code Status: Full Code  Family Communication: Son updated at bedside by NP & Dr. Nelda Marseille Disposition: Medical floor  Labs   CBC: Recent Labs  Lab 06/26/18 2208 06/29/18 1555 06/30/18 0316 07/01/18 0324  WBC 15.2* 16.7* 12.9* 11.0*  NEUTROABS 12.5* 14.2*  --   --   HGB 12.4 11.2* 9.6* 9.8*  HCT 39.3 35.8* 31.2* 32.7*  MCV 99.7 100.8* 104.3* 105.5*  PLT 345 302 250 637    Basic Metabolic Panel: Recent Labs  Lab 06/26/18 2208 06/29/18 1555 06/30/18 0316 07/01/18 0324  NA 140 139 139 140  K 3.9 3.4* 3.8 3.9  CL 101 101 106 105  CO2 '29 29 29 30  '$ GLUCOSE 116* 145* 105* 102*  BUN 19 25* 24* 19  CREATININE 0.97 0.80 0.77 0.68  CALCIUM 9.9 9.7 8.7* 9.1   GFR: CrCl cannot be calculated (Unknown ideal weight.). Recent Labs  Lab 06/26/18 2208 06/29/18 1545 06/29/18 1555 06/30/18 0316 07/01/18 0324  WBC 15.2*  --  16.7* 12.9* 11.0*  LATICACIDVEN  --  1.60  --   --   --     Liver Function Tests: Recent Labs  Lab 06/29/18 1555 06/30/18 0316 07/01/18 0324  AST '30 24 28  '$ ALT '22 19 30  '$ ALKPHOS 61 48 62  BILITOT 0.9 0.8 1.0  PROT 6.7 5.5* 5.7*  ALBUMIN 3.7 2.7* 2.9*   No results for input(s): LIPASE, AMYLASE in the last 168 hours. No results for input(s): AMMONIA in the last 168 hours.  ABG    Component Value Date/Time   PHART 7.395 02/11/2017 1548   PCO2ART 53.2 (H) 02/11/2017 1548   PO2ART 128 (H) 02/11/2017 1548   HCO3 31.9 (H) 02/11/2017 1548   O2SAT 98.7 02/11/2017 1548     Coagulation Profile: No results for input(s): INR, PROTIME in the last 168 hours.  Cardiac Enzymes: No results for input(s): CKTOTAL, CKMB, CKMBINDEX, TROPONINI in the last 168 hours.  HbA1C: Hgb A1c MFr Bld  Date/Time Value Ref Range Status    02/07/2017 12:21 PM 6.2 (H) 4.8 - 5.6 % Final    Comment:    (NOTE)         Pre-diabetes: 5.7 - 6.4         Diabetes: >6.4         Glycemic control for adults with diabetes: <7.0   07/29/2011 08:41 AM 5.3 4.6 - 6.5 % Final    Comment:    Glycemic Control Guidelines for People with Diabetes:Non Diabetic:  <6%Goal of Therapy: <7%Additional Action Suggested:  >8%     CBG: No results for input(s): GLUCAP in the last 168 hours.  Review of Systems: Positives in [place]  Gen: Denies fever, chills, weight change,  fatigue, night sweats HEENT: Denies blurred vision, double vision, hearing loss, tinnitus, sinus congestion, rhinorrhea, sore throat, neck stiffness, dysphagia PULM: Denies shortness of breath, dry cough, sputum production, hemoptysis, wheezing CV: Denies chest pain, edema, orthopnea, paroxysmal nocturnal dyspnea, palpitations GI: Denies abdominal pain, nausea, vomiting, diarrhea, hematochezia, melena, constipation, change in bowel habits GU: Denies dysuria, hematuria, polyuria, oliguria, urethral discharge Endocrine: Denies hot or cold intolerance, polyuria, polyphagia or appetite change Derm: Denies rash, dry skin, scaling or peeling skin change Heme: Denies easy bruising, bleeding, bleeding gums Neuro: Denies headache, numbness, weakness, slurred speech, loss of memory or consciousness   Past Medical History  She,  has a past medical history of Adenomatous colon polyp, Anxiety disorder, Brain stem stroke syndrome (1983), Diverticulosis, Fibroids, High altitude sickness (2008), and Premature menopause.   Surgical History    Past Surgical History:  Procedure Laterality Date  . BREAST LUMPECTOMY  1984   radiation , R breast  . CESAREAN SECTION     X 2  . colonoscopy with polypectomy  2011   Dr  Delfin Edis  . LAMINECTOMY     X3 L-Sspine  . MASTECTOMY  1990   R breast  . MASTECTOMY  1996   L     Social History   reports that she quit smoking about 39 years ago. She  has never used smokeless tobacco. She reports that she drinks about 9.0 standard drinks of alcohol per week. She reports that she does not use drugs.   Family History   Her family history includes Asthma in her maternal grandmother; Coronary artery disease in her mother; Drug abuse in her brother; Heart Problems in her father. There is no history of Cancer.   Allergies No Known Allergies   Home Medications  Prior to Admission medications   Medication Sig Start Date End Date Taking? Authorizing Provider  Cholecalciferol (VITAMIN D3) 1.25 MG (50000 UT) CAPS Take 1 capsule by mouth once a week.  05/07/18  Yes [provider]  docusate (COLACE) 50 MG/5ML liquid Take 10 mLs (100 mg total) by mouth 2 (two) times daily as needed for mild constipation. 02/20/17  Yes Robbie Lis, MD  donepezil (ARICEPT) 10 MG tablet Take 10 mg by mouth at bedtime.  06/19/18  Yes [provider]  DULoxetine (CYMBALTA) 20 MG capsule Take 20 mg by mouth 2 (two) times daily.  06/19/18  Yes [provider]  famotidine (PEPCID) 20 MG tablet Take 1 tablet (20 mg total) by mouth 2 (two) times daily. 02/20/17  Yes Robbie Lis, MD  FLUoxetine (PROZAC) 20 MG capsule Take 20 mg by mouth daily.   Yes [provider]  folic acid (FOLVITE) 1 MG tablet Take 1 tablet (1 mg total) by mouth daily. 02/20/17  Yes Robbie Lis, MD  ibandronate (BONIVA) 150 MG tablet Take 150 mg by mouth every 30 (thirty) days.  06/13/18  Yes [provider]  MOVANTIK 12.5 MG TABS tablet Take 12.5 mg by mouth daily.  06/25/18  Yes [provider]  Multiple Vitamin (MULTIVITAMIN WITH MINERALS) TABS tablet Take 1 tablet by mouth daily. 02/20/17  Yes Robbie Lis, MD  naproxen sodium (ALEVE) 220 MG tablet Take 440 mg by mouth 2 (two) times daily as needed (pain).   Yes [provider]  polyethylene glycol (MIRALAX / GLYCOLAX) packet Take 17 g by mouth daily as needed for mild constipation.   Yes  [provider]  thiamine 100 MG tablet Take 1  tablet (100 mg total) by mouth daily. 02/20/17  Yes Robbie Lis, MD  traZODone (DESYREL) 50 MG tablet Take 1 tablet (50 mg total) by mouth at bedtime. 02/20/17  Yes Robbie Lis, MD  XTAMPZA ER 18 MG C12A Take 1 tablet by mouth 2 (two) times daily.  06/24/18  Yes [provider]  acetaminophen (TYLENOL) 325 MG tablet Take 2 tablets (650 mg total) by mouth every 6 (six) hours as needed for mild pain (or Fever >/= 101). Patient not taking: Reported on 06/26/2018 02/20/17   Robbie Lis, MD  acetaminophen (TYLENOL) 500 MG tablet Take 1,000 mg by mouth every 6 (six) hours as needed for moderate pain.    [provider]  albuterol (PROVENTIL) (2.5 MG/3ML) 0.083% nebulizer solution Take 3 mLs (2.5 mg total) by nebulization every 3 (three) hours as needed for wheezing or shortness of breath. Patient not taking: Reported on 06/26/2018 02/20/17   Robbie Lis, MD  cefpodoxime (VANTIN) 200 MG tablet Take 1 tablet (200 mg total) by mouth every 12 (twelve) hours. Patient not taking: Reported on 06/26/2018 02/20/17   Robbie Lis, MD  cyclobenzaprine (FLEXERIL) 5 MG tablet Take 5 mg by mouth 3 (three) times daily as needed for muscle spasms.  03/30/18   [provider]  feeding supplement, ENSURE ENLIVE, (ENSURE ENLIVE) LIQD Take 237 mLs by mouth 2 (two) times daily between meals. Patient not taking: Reported on 06/29/2018 02/20/17   Robbie Lis, MD  guaiFENesin (ROBITUSSIN) 100 MG/5ML SOLN Take 20 mLs (400 mg total) by mouth 2 (two) times daily. Patient not taking: Reported on 06/26/2018 02/20/17   Robbie Lis, MD  ipratropium-albuterol (DUONEB) 0.5-2.5 (3) MG/3ML SOLN Take 3 mLs by nebulization 3 (three) times daily. Patient not taking: Reported on 06/29/2018 02/20/17   Robbie Lis, MD  Oxycodone HCl 10 MG TABS Take 1 tablet (10 mg total) by mouth every 6 (six) hours as needed. Patient not taking: Reported on 06/29/2018 02/20/17    Robbie Lis, MD          Noe Gens, NP-C [facility] Pgr: [phone] or if no answer [phone] 07/01/2018, 9:07 AM  Attending Note:  84 year old female with PMH of COPD and empyema who presents to PCCM with acute hypoxemic respiratory failure.  On exam, upper airway wheezes noted after eating breakfast.  I reviewed CXR myself, pulmonary edema and scaring noted.  Discussed with PCCM-NP.   Hypoxemia:             - Titrate O2 for sat of 88-92%             - Will need CSW to arrange for home O2  Fibrosis:             - CT of the chest without contrast             - ESR/CRP  HCAP:             - Vanc             - Cefepime             - F/u on culture             - Check PCT  Acute pulmonary edema:             - Echo             - BNP  Deconditioning:             -  PT             - From hospital to rehab  Dysphagia concern             - SLP  PCCM will continue to follow.  Patient seen and examined, agree with above note.  I dictated the care and orders written for this patient under my direction.  Rush Farmer, [place]

## 2018-07-01 NOTE — Progress Notes (Signed)
Patient is a resident at [facility]. The facility will accept the patient in SNF if skilled level of care is required at discharge.   Patient will need insurance authorization prior to admitting.  CSW will continue to follow for needs.   Kathrin Greathouse, Marlinda Mike, MSW Clinical Social Worker  [phone] 07/01/2018  8:43 AM

## 2018-07-01 NOTE — Progress Notes (Signed)
  Echocardiogram 2D Echocardiogram has been performed.  Jennette Dubin 07/01/2018, 2:58 PM

## 2018-07-02 DIAGNOSIS — J810 Acute pulmonary edema: Secondary | ICD-10-CM | POA: Diagnosis present

## 2018-07-02 DIAGNOSIS — E876 Hypokalemia: Secondary | ICD-10-CM

## 2018-07-02 DIAGNOSIS — J9601 Acute respiratory failure with hypoxia: Secondary | ICD-10-CM | POA: Diagnosis present

## 2018-07-02 DIAGNOSIS — J90 Pleural effusion, not elsewhere classified: Secondary | ICD-10-CM | POA: Diagnosis present

## 2018-07-02 DIAGNOSIS — I5041 Acute combined systolic (congestive) and diastolic (congestive) heart failure: Secondary | ICD-10-CM

## 2018-07-02 DIAGNOSIS — F0390 Unspecified dementia without behavioral disturbance: Secondary | ICD-10-CM

## 2018-07-02 LAB — MAGNESIUM: Magnesium: 1.7 mg/dL (ref 1.7–2.4)

## 2018-07-02 LAB — BASIC METABOLIC PANEL
ANION GAP: 5 (ref 5–15)
BUN: 16 mg/dL (ref 8–23)
CO2: 33 mmol/L — AB (ref 22–32)
Calcium: 9 mg/dL (ref 8.9–10.3)
Chloride: 101 mmol/L (ref 98–111)
Creatinine, Ser: 0.64 mg/dL (ref 0.44–1.00)
GLUCOSE: 146 mg/dL — AB (ref 70–99)
POTASSIUM: 3.6 mmol/L (ref 3.5–5.1)
Sodium: 139 mmol/L (ref 135–145)

## 2018-07-02 LAB — CBC
HCT: 32.1 % — ABNORMAL LOW (ref 36.0–46.0)
Hemoglobin: 9.6 g/dL — ABNORMAL LOW (ref 12.0–15.0)
MCH: 31.7 pg (ref 26.0–34.0)
MCHC: 29.9 g/dL — ABNORMAL LOW (ref 30.0–36.0)
MCV: 105.9 fL — AB (ref 80.0–100.0)
NRBC: 0 % (ref 0.0–0.2)
Platelets: 274 10*3/uL (ref 150–400)
RBC: 3.03 MIL/uL — AB (ref 3.87–5.11)
RDW: 13.9 % (ref 11.5–15.5)
WBC: 10.2 10*3/uL (ref 4.0–10.5)

## 2018-07-02 LAB — MRSA PCR SCREENING: MRSA by PCR: NEGATIVE

## 2018-07-02 LAB — PROCALCITONIN: Procalcitonin: 0.1 ng/mL

## 2018-07-02 MED ORDER — METHYLPREDNISOLONE SODIUM SUCC 40 MG IJ SOLR
40.0000 mg | Freq: Three times a day (TID) | INTRAMUSCULAR | Status: DC
  Administered 2018-07-02: 40 mg via INTRAVENOUS
  Filled 2018-07-02: qty 1

## 2018-07-02 MED ORDER — SODIUM CHLORIDE 0.9 % IV SOLN
1.0000 g | Freq: Two times a day (BID) | INTRAVENOUS | Status: DC
  Administered 2018-07-02: 1 g via INTRAVENOUS
  Filled 2018-07-02 (×2): qty 1

## 2018-07-02 MED ORDER — AMOXICILLIN-POT CLAVULANATE 875-125 MG PO TABS
1.0000 | ORAL_TABLET | Freq: Two times a day (BID) | ORAL | Status: DC
  Administered 2018-07-02 – 2018-07-05 (×7): 1 via ORAL
  Filled 2018-07-02 (×7): qty 1

## 2018-07-02 MED ORDER — POTASSIUM CHLORIDE CRYS ER 20 MEQ PO TBCR
20.0000 meq | EXTENDED_RELEASE_TABLET | Freq: Every day | ORAL | Status: DC
  Administered 2018-07-02: 20 meq via ORAL
  Filled 2018-07-02: qty 1

## 2018-07-02 MED ORDER — SACUBITRIL-VALSARTAN 24-26 MG PO TABS
1.0000 | ORAL_TABLET | Freq: Two times a day (BID) | ORAL | Status: DC
  Administered 2018-07-03: 1 via ORAL
  Filled 2018-07-02 (×2): qty 1

## 2018-07-02 MED ORDER — FUROSEMIDE 10 MG/ML IJ SOLN
20.0000 mg | Freq: Two times a day (BID) | INTRAMUSCULAR | Status: DC
  Administered 2018-07-02: 20 mg via INTRAVENOUS
  Filled 2018-07-02: qty 2

## 2018-07-02 MED ORDER — LISINOPRIL 5 MG PO TABS
2.5000 mg | ORAL_TABLET | Freq: Every day | ORAL | Status: DC
  Administered 2018-07-02: 2.5 mg via ORAL
  Filled 2018-07-02: qty 1

## 2018-07-02 MED ORDER — FUROSEMIDE 10 MG/ML IJ SOLN
40.0000 mg | Freq: Four times a day (QID) | INTRAMUSCULAR | Status: DC
  Administered 2018-07-02 – 2018-07-03 (×6): 40 mg via INTRAVENOUS
  Filled 2018-07-02 (×7): qty 4

## 2018-07-02 MED ORDER — METHYLPREDNISOLONE SODIUM SUCC 40 MG IJ SOLR
40.0000 mg | Freq: Two times a day (BID) | INTRAMUSCULAR | Status: DC
  Administered 2018-07-02 – 2018-07-03 (×2): 40 mg via INTRAVENOUS
  Filled 2018-07-02 (×2): qty 1

## 2018-07-02 NOTE — Progress Notes (Signed)
PT Cancellation Note  Patient Details Name: Yolanda [name] MRN: [id number] DOB: 06-24-1934   Cancelled Treatment:    Reason Eval/Treat Not Completed: Medical issues which prohibited therapy. Patient moved to Tele. Will check back another time.    Claretha Cooper 07/02/2018, 3:34 PM [place] Pager [phone] Office [phone]

## 2018-07-02 NOTE — Evaluation (Addendum)
Clinical/Bedside Swallow Evaluation Patient Details  Name: Yolanda [name] MRN: [id number] Date of Birth: 05/27/1934  Today's Date: 07/02/2018 Time: SLP Start Time (ACUTE ONLY): 1249 SLP Stop Time (ACUTE ONLY): 1310 SLP Time Calculation (min) (ACUTE ONLY): 21 min  Past Medical History:  Past Medical History:  Diagnosis Date  . Adenomatous colon polyp    tubular  . Anxiety disorder    panic attacks, PMH of. Dr  Janna Arch  . 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 Diverticulosis   . Empyema ([place])    Left   . Fibroids    Lupron shots  . GERD (gastroesophageal reflux disease)   . High altitude sickness 2008  . Premature menopause    due to Lupron   Past Surgical History:  Past Surgical History:  Procedure Laterality Date  . BREAST LUMPECTOMY  1984   radiation , R breast  . CESAREAN SECTION     X 2  . colonoscopy with polypectomy  2011   Dr  Delfin Edis  . LAMINECTOMY     X3 L-Sspine  . MASTECTOMY  1990   R breast  . MASTECTOMY  1996   L   HPI:  84 yo female adm to [facility] with pna, has h/o dementia- resides at independent living center with 24 hour caregiver.  Swallow eval ordered.      Assessment / Plan / Recommendation Clinical Impression  Patient presents with CN deficits impacting palatal elevation *deviates to right* concerning for possible vagus and glossopharyngeal nerve involvement.  Son states pt has prior stroke in her 56s but denies this impacting her swallowing.  Of note, she did have brainstem stroke syndrome per hx in this note.  Pt did not pass the 3 ounce water test due to requiring rest break.  No overt indications of aspiration observed.  Son reports pt with frequent coughing with solids yesterday - pt denies reflux/esophageal hx.    Advised pt/son to findings/recommendations to mitigate aspiration risk including order softer foods (not chicken breast or pork loin as son reports problems with meat).   Per family, pt  has not had pneumonia (until now), lost weight or required heimlich manuever.    Recommend continue diet with precautions.  Will follow up next date for MBS.  MBS to be done next date given pt brainstem stroke syndrome and vagus nerve/glossopharyngeal nerve concerns.    Phoned and left message with Hoyle Sauer to relay to son/pt regarding plan.    SLP Visit Diagnosis: Dysphagia, unspecified (R13.10)    Aspiration Risk  Mild aspiration risk    Diet Recommendation Regular;Thin liquid   Liquid Administration via: Cup;Straw Medication Administration: Whole meds with puree Supervision: Full supervision/cueing for compensatory strategies Compensations: Slow rate;Small sips/bites;Other (Comment)(start meals with liquids) Postural Changes: Seated upright at 90 degrees;Remain upright for at least 30 minutes after po intake    Other  Recommendations Oral Care Recommendations: Oral care BID   Follow up Recommendations        Frequency and Duration min 1 x/week  1 week       Prognosis Prognosis for Safe Diet Advancement: Fair Barriers to Reach Goals: Cognitive deficits      Swallow Study   General Date of Onset: 07/02/18 HPI: 84 yo female adm to [facility] with pna, has h/o dementia- resides at independent living center with 24 hour caregiver.  Swallow eval ordered.    Type of Study: Bedside Swallow Evaluation Diet Prior to this Study:  Regular;Thin liquids Temperature Spikes Noted: No Respiratory Status: Nasal cannula History of Recent Intubation: No Behavior/Cognition: Alert;Cooperative;Pleasant mood Oral Care Completed by SLP: Yes Oral Cavity - Dentition: Dentures, top;Dentures, bottom Vision: Functional for self-feeding Self-Feeding Abilities: Able to feed self Patient Positioning: Upright in bed Baseline Vocal Quality: Low vocal intensity Volitional Cough: Weak Volitional Swallow: Able to elicit    Oral/Motor/Sensory Function Overall Oral Motor/Sensory Function: Other  (comment)(palatal deviation to right upon phonation)   Ice Chips Ice chips: Not tested   Thin Liquid Thin Liquid: Impaired Presentation: Straw Pharyngeal  Phase Impairments: Throat Clearing - Immediate Other Comments: throat clear x1/4 boluses, pt did NOT pass the 3 ounce water test due to her stopping    Nectar Thick Nectar Thick Liquid: Not tested   Honey Thick Honey Thick Liquid: Not tested   Puree Puree: Not tested   Solid     Solid: Within functional limits Presentation: Vivi Ferns 07/02/2018,2:42 PM  Luanna Salk, [place] Pager [phone] Office [phone]

## 2018-07-02 NOTE — Progress Notes (Addendum)
NAME:  Yolanda [name], MRN:  [id number], DOB:  11-14-1933, LOS: 3 ADMISSION DATE:  06/29/2018, CONSULTATION DATE:  07/01/18 REFERRING MD:  Dr. Zigmund Daniel / [facility], CHIEF COMPLAINT:  Shortness of Breath   Brief History   84 y/o F who presented to [facility] ER on 11/11 with reports of SOB from her Assisted Living. She had recently been seen (11/8) in the ER for diarrhea related to too much Miralax.   The patient was febrile to 101.4, tachycardic with elevated WBC (16.7) on presentation.  Admitted for sepsis physiology with possible pulmonary source with patchy parenchymal opacities.    Past Medical History  Brainstem CVA secondary to occlusion of congenital vascular anomaly, diverticulosis, fibroids, anxiety, left empyema, ETOH abuse, GERD  Significant Hospital Events   11/11  Admit with reports of SOB  Consults:  11/13 PCCM   Procedures:    Significant Diagnostic Tests:    Micro Data:  BCx2 11/11 >>   Antimicrobials:  Vanco 11/11 >>  Cefepime 11/11 >>    Interim History / Subjective:  RN reports pt had episode of respiratory distress this am > required increase in O2 to 6L, elevated JVD / wheezing.  Recovered with sitting upright, ativan.   Objective   Blood pressure (!) 154/70, pulse (!) 106, temperature 98.3 F (36.8 C), temperature source Oral, resp. rate (!) 26, height 5\' 3"  (1.6 m), SpO2 93 %.        Intake/Output Summary (Last 24 hours) at 07/02/2018 1148 Last data filed at 07/02/2018 1000 Gross per 24 hour  Intake 1267.81 ml  Output 400 ml  Net 867.81 ml   There were no vitals filed for this visit.  Examination: General: elderly female lying in bed in NAD HEENT: MM pink/moist, jvd+ Neuro: sleeping peacefully, no distress, family at bedside  CV: s1s2 rrr, no m/r/g PULM: even/non-labored, scattered wheezing anterior / referred upper airway noise, diminished R base XL:KGMW, non-tender, bsx4 active  Extremities: warm/dry, no significant peripheral edema  Skin: no  rashes or lesions  Resolved Hospital Problem list     Assessment & Plan:   Acute Hypoxemic Respiratory Failure  -in setting of suspected HCAP, possible aspiration and underlying emphysema / scarring and CHF P: Wean O2 for sats 88-95% Pulmonary hygiene -IS, mobilize  PT efforts  SLP following Continue abx for 7 days Follow fever curve / WBC trend Decrease steroids > feel wheezing is upper airway + CHF.  Taper to off over next 24-48 hours  Takotsubo Cardiomyopathy  P: Recommend Cardiology consultation, discussed with primary MD Will cautiously trial low dose diuretics > lasix 20 mg IV Q12 Begin ACE-I (lisinopril 2.5 mg QD) Transfer to Tele monitoring   Moderate Right Pleural Effusion  -secondary to above  P: Trial diuresis, if no response in effusion will consider sampling / thora  SIRS Physiology  -in setting of Takosubo  P: Transition abx to Augmentin (reduce sodium load) Discontinue vanco   Agitated Delirium superimposed on Dementia  P: Limit sedating medications as able  Goals of Care P: Discussed at bedside with son.  Agree CPR is not likely in her best interest under any circumstances but mechanical ventilation could be considered if reversible process. Will place order for LCB.    Best practice:  Diet: As tolerated Pain/Anxiety/Delirium protocol (if indicated): n/a VAP protocol (if indicated): n/a DVT prophylaxis:  GI prophylaxis: n/a Glucose control: per primary  Mobility: As tolerated  Code Status: Full Code  Family Communication: Son updated at bedside on plan  of care. Disposition: Transition to telemetry floor  Labs   CBC: Recent Labs  Lab 06/26/18 2208 06/29/18 1555 06/30/18 0316 07/01/18 0324 07/02/18 0334  WBC 15.2* 16.7* 12.9* 11.0* 10.2  NEUTROABS 12.5* 14.2*  --   --   --   HGB 12.4 11.2* 9.6* 9.8* 9.6*  HCT 39.3 35.8* 31.2* 32.7* 32.1*  MCV 99.7 100.8* 104.3* 105.5* 105.9*  PLT 345 302 250 258 403    Basic Metabolic  Panel: Recent Labs  Lab 06/26/18 2208 06/29/18 1555 06/30/18 0316 07/01/18 0324 07/02/18 0334  NA 140 139 139 140 139  K 3.9 3.4* 3.8 3.9 3.6  CL 101 101 106 105 101  CO2 29 29 29 30  33*  GLUCOSE 116* 145* 105* 102* 146*  BUN 19 25* 24* 19 16  CREATININE 0.97 0.80 0.77 0.68 0.64  CALCIUM 9.9 9.7 8.7* 9.1 9.0  MG  --   --   --   --  1.7   GFR: CrCl cannot be calculated (Unknown ideal weight.). Recent Labs  Lab 06/29/18 1545 06/29/18 1555 06/30/18 0316 07/01/18 0324 07/01/18 1058 07/02/18 0334  PROCALCITON  --   --   --   --  0.11 0.10  WBC  --  16.7* 12.9* 11.0*  --  10.2  LATICACIDVEN 1.60  --   --   --   --   --     Liver Function Tests: Recent Labs  Lab 06/29/18 1555 06/30/18 0316 07/01/18 0324  AST 30 24 28   ALT 22 19 30   ALKPHOS 61 48 62  BILITOT 0.9 0.8 1.0  PROT 6.7 5.5* 5.7*  ALBUMIN 3.7 2.7* 2.9*   No results for input(s): LIPASE, AMYLASE in the last 168 hours. No results for input(s): AMMONIA in the last 168 hours.  ABG    Component Value Date/Time   PHART 7.395 02/11/2017 1548   PCO2ART 53.2 (H) 02/11/2017 1548   PO2ART 128 (H) 02/11/2017 1548   HCO3 31.9 (H) 02/11/2017 1548   O2SAT 98.7 02/11/2017 1548     Coagulation Profile: No results for input(s): INR, PROTIME in the last 168 hours.  Cardiac Enzymes: No results for input(s): CKTOTAL, CKMB, CKMBINDEX, TROPONINI in the last 168 hours.  HbA1C: Hgb A1c MFr Bld  Date/Time Value Ref Range Status  02/07/2017 12:21 PM 6.2 (H) 4.8 - 5.6 % Final    Comment:    (NOTE)         Pre-diabetes: 5.7 - 6.4         Diabetes: >6.4         Glycemic control for adults with diabetes: <7.0   07/29/2011 08:41 AM 5.3 4.6 - 6.5 % Final    Comment:    Glycemic Control Guidelines for People with Diabetes:Non Diabetic:  <6%Goal of Therapy: <7%Additional Action Suggested:  >8%     CBG: No results for input(s): GLUCAP in the last 168 hours.  Noe Gens, NP-C [facility] Pgr:  [phone] or if no answer [phone] 07/02/2018, 11:48 AM  Attending Note:  84 year old female with takasobu cardiomyopathy who presents with respiratory failure due to CHF.  On exam, diffuse crackles noted.  I reviewed chest CT myself, effusion noted with fibrosis.  Discussed with [facility]-MD and PCCM-NP.  Pleural effusion:  - D/C thora  - Active diureses  Fibrosis:  - Taper steroids quickly  Wheezing is cardiac, treat as below  CHF:  - Lasix  - Low dose ACE  - Low dose beta  blockers  - Strict I/O  GOC: discussed case at length with son, LCB with intubation only and short term intubation only  PCCM will sign off, please call back if needed  Patient seen and examined, agree with above note.  I dictated the care and orders written for this patient under my direction.  Rush Farmer, [place]

## 2018-07-02 NOTE — Progress Notes (Signed)
I have reviewed and concur with this student's documentation.   

## 2018-07-02 NOTE — Progress Notes (Addendum)
PROGRESS NOTE    LINDA GRIMMER  XBD:[id number] DOB: 21-Mar-1934 DOA: 06/29/2018 PCP: Javier Glazier, MD   Brief Narrative:  84 y.o. female from [place] independent living facility with past medical history of dementia, CVA was admitted to hospital with complaints of shortness of breath, cough, fever and chills.  Patient came into the ER 2 days prior to this admission with diarrhea with dehydration and was given IV fluids and sent back to the facility.  Patient is not on oxygen at home. In the ED, received vancomycin and zosyn.  Temp of 101.4, blood pressure 139/81 pulse is 90 respiration 28 sats 93% on 5 L.  Sodium 139 potassium 3.4 BUN 25 creatinine 0.80 LFTs normal lactic acid 1.60.  White count 16.7 hemoglobin 11.2 platelet count 302.  Chest x-ray showed bilateral small effusions and patchy parenchymal opacities likely representing early infiltrate.  She was seen by pulmonary and a CT scan of the chest was performed which showed multifocal pneumonia.    Assessment & Plan:   Active Problems:   HCAP (healthcare-associated pneumonia)   Dementia without behavioral disturbance ([place])   Hypokalemia  Acute hypoxic respiratory failure secondary to bilateral pneumonia.  Patient had a fever, leukocytosis, tachycardia and tachypnea on presentation.  Patient is from independent living facility.  On vancomycin and cefepime.  CT scan of the chest showed multifocal pneumonia with right-sided moderate pleural effusion.  Consider ultrasound-guided thoracocentesis.  Patient is still requiring 5 L of oxygen. T-max of 98.8 F.  leukocytosis has trended down.  Negative blood cultures 2 days.  continue nebulizer and add IV steroids.  Seen by PCCM. Will likely need home oxygen on discharge.  2D echocardiogram.  Pro-calcitonin of 0.1.  BNP 862.  We will add IV steroids for wheezing.  Right sided moderate pleural effusion.  Consider ultrasound-guided diagnostic and therapeutic tapping today.  Chronic pain  syndrome  on Movantik and OxyContin ER    Dementia with behavioral issues Continue on Aricept.  Low-dose Ativan for anxiety.  Hypokalemia.  Replenished and improved.  History of depression  continue on Cymbalta and Prozac.  DVT prophylaxis: Lovenox  Code Status: Full code  Family Communication: Spoke with the patient's caregiver at bedside. Unable to reach patient's son.  Disposition Plan:  ALF/SNF pending clinical improvement.  Likely in 2 to 3 days.  Continue to wean oxygen if possible.  Follow pulmonary recommendations.  Consultants: PCCM  Procedures: None  Antimicrobials: Vanco and cefepime 11/11>  Subjective:   Patient states that she is not feeling well.  She has more wheezing and in distress.  Caregiver at bedside states that she is more agitated and confused today.  Objective: Vitals:   07/01/18 2028 07/01/18 2059 07/02/18 0630 07/02/18 0815  BP:  136/70 (!) 147/73   Pulse:  100 90   Resp:  20 18   Temp:  98.8 F (37.1 C) 98.8 F (37.1 C)   TempSrc:  Oral Oral   SpO2: 95% 97% 98% 96%  Height:        Intake/Output Summary (Last 24 hours) at 07/02/2018 0845 Last data filed at 07/02/2018 0647 Gross per 24 hour  Intake 1433.98 ml  Output 500 ml  Net 933.98 ml   Vitals:   07/02/18 0630 07/02/18 0815  BP: (!) 147/73   Pulse: 90   Resp: 18   Temp: 98.8 F (37.1 C)   SpO2: 98% 96%   Physical Examination:  General exam: Appears calm and agitated in mild respiratory distress.  Nasal  cannula 5 L/min. HEENT:PERRL,Oral mucosa moist Respiratory system: Scattered rhonchi bilaterally with diminished breath sounds. Cardiovascular system: S1 & S2 heard, RRR.  Gastrointestinal system: Abdomen is nondistended, soft and nontender. No organomegaly or masses felt. Normal bowel sounds heard.  External urinary catheter in place. Central nervous system: Awake and communicative.  Disoriented and confused Dementia..  No focal neurological deficits. Extremities: No  edema, no clubbing ,no cyanosis, distal peripheral pulses palpable. Skin: No rashes, lesions or ulcers,no icterus ,no pallor MSK: Normal muscle bulk,tone ,power  Data Reviewed: I have personally reviewed following labs and imaging studies  CBC: Recent Labs  Lab 06/26/18 2208 06/29/18 1555 06/30/18 0316 07/01/18 0324 07/02/18 0334  WBC 15.2* 16.7* 12.9* 11.0* 10.2  NEUTROABS 12.5* 14.2*  --   --   --   HGB 12.4 11.2* 9.6* 9.8* 9.6*  HCT 39.3 35.8* 31.2* 32.7* 32.1*  MCV 99.7 100.8* 104.3* 105.5* 105.9*  PLT 345 302 250 258 604   Basic Metabolic Panel: Recent Labs  Lab 06/26/18 2208 06/29/18 1555 06/30/18 0316 07/01/18 0324 07/02/18 0334  NA 140 139 139 140 139  K 3.9 3.4* 3.8 3.9 3.6  CL 101 101 106 105 101  CO2 29 29 29 30  33*  GLUCOSE 116* 145* 105* 102* 146*  BUN 19 25* 24* 19 16  CREATININE 0.97 0.80 0.77 0.68 0.64  CALCIUM 9.9 9.7 8.7* 9.1 9.0  MG  --   --   --   --  1.7   GFR: CrCl cannot be calculated (Unknown ideal weight.). Liver Function Tests: Recent Labs  Lab 06/29/18 1555 06/30/18 0316 07/01/18 0324  AST 30 24 28   ALT 22 19 30   ALKPHOS 61 48 62  BILITOT 0.9 0.8 1.0  PROT 6.7 5.5* 5.7*  ALBUMIN 3.7 2.7* 2.9*   No results for input(s): LIPASE, AMYLASE in the last 168 hours. No results for input(s): AMMONIA in the last 168 hours. Coagulation Profile: No results for input(s): INR, PROTIME in the last 168 hours. Cardiac Enzymes: No results for input(s): CKTOTAL, CKMB, CKMBINDEX, TROPONINI in the last 168 hours. BNP (last 3 results) No results for input(s): PROBNP in the last 8760 hours. HbA1C: No results for input(s): HGBA1C in the last 72 hours. CBG: No results for input(s): GLUCAP in the last 168 hours. Lipid Profile: No results for input(s): CHOL, HDL, LDLCALC, TRIG, CHOLHDL, LDLDIRECT in the last 72 hours. Thyroid Function Tests: No results for input(s): TSH, T4TOTAL, FREET4, T3FREE, THYROIDAB in the last 72 hours. Anemia Panel: No  results for input(s): VITAMINB12, FOLATE, FERRITIN, TIBC, IRON, RETICCTPCT in the last 72 hours. Sepsis Labs: Recent Labs  Lab 06/29/18 1545 07/01/18 1058 07/02/18 0334  PROCALCITON  --  0.11 0.10  LATICACIDVEN 1.60  --   --     Recent Results (from the past 240 hour(s))  Blood Culture (routine x 2)     Status: None (Preliminary result)   Collection Time: 06/29/18  3:52 PM  Result Value Ref Range Status   Specimen Description   Final    BLOOD LEFT FOREARM Performed at [facility] [address] [place], [place] [address]    Special Requests   Final    BOTTLES DRAWN AEROBIC AND ANAEROBIC Blood Culture adequate volume Performed at [facility] [address] [place], [place] [address]    Culture   Final    NO GROWTH 2 DAYS Performed at [facility] [address] [place], [place] [address]  Report Status PENDING  Incomplete  Blood Culture (routine x 2)     Status: None (Preliminary result)   Collection Time: 06/29/18  3:55 PM  Result Value Ref Range Status   Specimen Description   Final    BLOOD RIGHT FOREARM Performed at [facility] [address] [place], [place] [address]    Special Requests   Final    BOTTLES DRAWN AEROBIC ONLY Blood Culture adequate volume Performed at [facility] [address], [place], [place] [address]    Culture   Final    NO GROWTH 2 DAYS Performed at [facility] [address] [place], [place] [address]    Report Status PENDING  Incomplete      Radiology Studies: Ct Chest Wo Contrast  Result Date: 07/01/2018 CLINICAL DATA:  84 year old female with shortness of breath EXAM: CT CHEST WITHOUT CONTRAST TECHNIQUE: Multidetector CT imaging of the chest was performed following the standard protocol without IV contrast. COMPARISON:  A CT scan of the chest 02/13/2017 FINDINGS: Cardiovascular: Limited evaluation in the absence of intravenous  contrast. Cardiomegaly. Marked enlargement of the main pulmonary artery which measures approximately 3.5 cm in transverse diameter. Similarly, the ascending aorta is ectatic at 3.6 cm in diameter. Calcifications present along the aorta and coronary arteries. No pericardial effusion. Mediastinum/Nodes: Limited evaluation in the absence of intravenous contrast. No definitively enlarged adenopathy or mediastinal mass. Lungs/Pleura: Moderate layering right pleural effusion. Trace left pleural effusion. Advanced centrilobular pulmonary emphysema. Diffuse ground-glass attenuation airspace opacity throughout the right upper lobe and the superior segment of the right lower lobe. The asymmetry of the findings suggests pneumonia. Bilateral atelectasis. No evidence of pneumothorax. No suspicious mass or nodule. Upper Abdomen: No acute abnormality in the visualized upper abdomen. Musculoskeletal: Epidural spinal stimulator. Advanced left shoulder joint osteoarthritis. Mild right shoulder joint osteoarthritis. Stable T11 and T12 compression fractures. New compression fracture of the inferior endplate of T8 with approximately 20% height loss compared to 02/13/2017. IMPRESSION: 1. Diffuse airspace opacity throughout the right upper lobe and the superior segment of the right lower lobe concerning for multifocal pneumonia. Asymmetric pulmonary edema is a less likely consideration. 2. Moderate layering right pleural effusion. 3. Advanced centrilobular pulmonary emphysema. 4. Cardiomegaly. 5. Pulmonary artery enlargement suggesting underlying pulmonary arterial hypertension. 6. Coronary artery calcifications. 7. Trace left pleural effusion. 8. Age indeterminate T8 compression fracture is a new finding compared to 02/13/2017. 9. Stable chronic T11 and T12 compression fractures. Aortic Atherosclerosis (ICD10-I70.0) and Emphysema (ICD10-J43.9). Electronically Signed   By: Jacqulynn Cadet M.D.   On: 07/01/2018 19:24   Dg Chest Port 1  View  Result Date: 07/01/2018 CLINICAL DATA:  Increasing shortness of breath this morning EXAM: PORTABLE CHEST 1 VIEW COMPARISON:  PA and lateral chest x-ray of June 29, 2018 FINDINGS: The lungs are less well inflated today. The interstitial markings have increased bilaterally greatest on the right. The hemidiaphragms are now obscured. The heart is top-normal in size. The pulmonary vascularity is indistinct. There is calcification in the wall of the aortic arch. There are degenerative changes of the left shoulder. IMPRESSION: Worsening interstitial densities bilaterally most compatible with asymmetric pulmonary edema though pneumonia could produce similar findings. Small bilateral pleural effusions have increased. Thoracic aortic atherosclerosis. Electronically Signed   By: David  Martinique M.D.   On: 07/01/2018 12:59     Scheduled Meds: . albuterol  2.5 mg Nebulization QID  . docusate sodium  100 mg Oral BID  . donepezil  10 mg Oral QHS  . DULoxetine  20 mg Oral BID  . FLUoxetine  20 mg Oral Daily  . naloxegol oxalate  12.5 mg Oral Daily  . oxyCODONE  20 mg Oral Q12H  . traZODone  50 mg Oral QHS   Continuous Infusions: . sodium chloride Stopped (06/30/18 0918)  . ceFEPime (MAXIPIME) IV    . vancomycin 1,000 mg (07/01/18 0409)     LOS: 3 days    Flora Lipps, MD [facility] If 7PM-7AM, please contact night-coverage [email or web address] Password [email or web address] 07/02/2018, 8:45 AM

## 2018-07-02 NOTE — Consult Note (Addendum)
Cardiology Consultation:   Patient ID: Yolanda [name] MRN: [id number]; DOB: [birth year]  Admit date: 06/29/2018 Date of Consult: 07/02/2018  Primary Care Provider: Javier Glazier, MD Primary Cardiologist: Pixie Casino, MD Primary Electrophysiologist:  None    Patient Profile:   Yolanda [name] is a 84 y.o. 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 Louanne [name].  History of Present Illness:   Ms. Leland was seen by Dr. Debara [name] in 11/2015 for abnormal EKG and preop risk evaluation prior to dental procedure.  EKG findings were felt to be related to her mastectomy and a short distance between her EKG leads and her heart, not LVH as previously indicated.  An echocardiogram done on 11/27/2015 showed only mild LVH, normal LV systolic function with EF 41-74%, grade 1 diastolic dysfunction and mild aortic regurgitation.  Ms. Levels was admitted to [facility] long hospital on 06/29/2018 from assisted living with complaints of shortness of breath.  (She had been recently seen in the ED on 06/26/2018 for diarrhea related to too much MiraLAX.) On this admission she was found to be febrile at 101.4, tachycardic and had leukocytosis.  She was admitted for sepsis with possible pulmonary source, suspected HCAP, possible aspiration and underlying emphysema/scarring and CHF. She has been started on IV antibiotics.  Echocardiogram shows reduction in LV systolic function with EF 30-35% with akinesis of the mid-apicalanteroseptal, inferior, and apical myocardium, down from previous echo in 2017. She has just been started on lasix, low dose BB and ACE-I.   Upon my assessment the patient has the giggles. She is  oriented to self but cannot tell me where she is or why she is here. She is unable to answer questions about chest pain or breathing. She is lying almost flat for EKG without difficulty breathing.  Currently she is comfortable. She has a long time friend with her that is able to answer some questions. The patient apparently used to drink alcohol regularly, unknown how much. She was hospitalized for a fall about a year and a half ago and has not been drinking alcohol since then. She does not smoke.   She lives at an assisted living facility in independent living section but has around the clock hired care to assist with ADLs. She has 3 sons.   Past Medical History:  Diagnosis Date  . Adenomatous colon polyp    tubular  . Anxiety disorder    panic attacks, PMH of. Dr  Yolanda [name]  . 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 Diverticulosis   . Empyema ([place])    Left   . Fibroids    Lupron shots  . GERD (gastroesophageal reflux disease)   . High altitude sickness 2008  . Premature menopause    due to Lupron    Past Surgical History:  Procedure Laterality Date  . BREAST LUMPECTOMY  1984   radiation , R breast  . CESAREAN SECTION     X 2  . colonoscopy with polypectomy  2011   Dr  Yolanda [name]  . LAMINECTOMY     X3 L-Sspine  . MASTECTOMY  1990   R breast  . MASTECTOMY  1996   L  Home Medications:  Prior to Admission medications   Medication Sig Start Date End Date Taking? Authorizing Provider  Cholecalciferol (VITAMIN D3) 1.25 MG (50000 UT) CAPS Take 1 capsule by mouth once a week.  05/07/18  Yes [provider]  docusate (COLACE) 50 MG/5ML liquid Take 10 mLs (100 mg total) by mouth 2 (two) times daily as needed for mild constipation. 02/20/17  Yes Robbie Lis, MD  donepezil (ARICEPT) 10 MG tablet Take 10 mg by mouth at bedtime.  06/19/18  Yes [provider]  DULoxetine (CYMBALTA) 20 MG capsule Take 20 mg by mouth 2 (two)  times daily.  06/19/18  Yes [provider]  famotidine (PEPCID) 20 MG tablet Take 1 tablet (20 mg total) by mouth 2 (two) times daily. 02/20/17  Yes Robbie Lis, MD  FLUoxetine (PROZAC) 20 MG capsule Take 20 mg by mouth daily.   Yes [provider]  folic acid (FOLVITE) 1 MG tablet Take 1 tablet (1 mg total) by mouth daily. 02/20/17  Yes Robbie Lis, MD  ibandronate (BONIVA) 150 MG tablet Take 150 mg by mouth every 30 (thirty) days.  06/13/18  Yes [provider]  MOVANTIK 12.5 MG TABS tablet Take 12.5 mg by mouth daily.  06/25/18  Yes [provider]  Multiple Vitamin (MULTIVITAMIN WITH MINERALS) TABS tablet Take 1 tablet by mouth daily. 02/20/17  Yes Robbie Lis, MD  naproxen sodium (ALEVE) 220 MG tablet Take 440 mg by mouth 2 (two) times daily as needed (pain).   Yes [provider]  polyethylene glycol (MIRALAX / GLYCOLAX) packet Take 17 g by mouth daily as needed for mild constipation.   Yes [provider]  thiamine 100 MG tablet Take 1 tablet (100 mg total) by mouth daily. 02/20/17  Yes Robbie Lis, MD  traZODone (DESYREL) 50 MG tablet Take 1 tablet (50 mg total) by mouth at bedtime. 02/20/17  Yes Robbie Lis, MD  XTAMPZA ER 18 MG C12A Take 1 tablet by mouth 2 (two) times daily.  06/24/18  Yes [provider]  acetaminophen (TYLENOL) 325 MG tablet Take 2 tablets (650 mg total) by mouth every 6 (six) hours as needed for mild pain (or Fever >/= 101). Patient not taking: Reported on 06/26/2018 02/20/17   Robbie Lis, MD  acetaminophen (TYLENOL) 500 MG tablet Take 1,000 mg by mouth every 6 (six) hours as needed for moderate pain.    [provider]  albuterol (PROVENTIL) (2.5 MG/3ML) 0.083% nebulizer solution Take 3 mLs (2.5 mg total) by nebulization every 3 (three) hours as needed for wheezing or shortness of breath. Patient not taking: Reported on 06/26/2018 02/20/17   Robbie Lis, MD  cefpodoxime (VANTIN) 200 MG tablet  Take 1 tablet (200 mg total) by mouth every 12 (twelve) hours. Patient not taking: Reported on 06/26/2018 02/20/17   Robbie Lis, MD  cyclobenzaprine (FLEXERIL) 5 MG tablet Take 5 mg by mouth 3 (three) times daily as needed for muscle spasms.  03/30/18   [provider]  feeding supplement, ENSURE ENLIVE, (ENSURE ENLIVE) LIQD Take 237 mLs by mouth 2 (two) times daily between meals. Patient not taking: Reported on 06/29/2018 02/20/17   Robbie Lis, MD  guaiFENesin (ROBITUSSIN) 100 MG/5ML SOLN Take 20 mLs (400 mg total) by mouth 2 (two) times daily. Patient not taking: Reported on 06/26/2018 02/20/17   Robbie Lis, MD  ipratropium-albuterol (DUONEB) 0.5-2.5 (3) MG/3ML SOLN Take 3 mLs by nebulization 3 (  three) times daily. Patient not taking: Reported on 06/29/2018 02/20/17   Robbie Lis, MD  Oxycodone HCl 10 MG TABS Take 1 tablet (10 mg total) by mouth every 6 (six) hours as needed. Patient not taking: Reported on 06/29/2018 02/20/17   Robbie Lis, MD    Inpatient Medications: Scheduled Meds: . albuterol  2.5 mg Nebulization QID  . amoxicillin-clavulanate  1 tablet Oral Q12H  . docusate sodium  100 mg Oral BID  . donepezil  10 mg Oral QHS  . DULoxetine  20 mg Oral BID  . FLUoxetine  20 mg Oral Daily  . furosemide  20 mg Intravenous Q12H  . lisinopril  2.5 mg Oral Daily  . methylPREDNISolone (SOLU-MEDROL) injection  40 mg Intravenous Q12H  . naloxegol oxalate  12.5 mg Oral Daily  . oxyCODONE  20 mg Oral Q12H  . potassium chloride  20 mEq Oral Daily  . traZODone  50 mg Oral QHS   Continuous Infusions: . sodium chloride Stopped (06/30/18 0918)   PRN Meds: sodium chloride, albuterol, cyclobenzaprine, LORazepam  Allergies:   No Known Allergies  Social History:   Social History   Socioeconomic History  . Marital status: Single    Spouse name: Not on file  . Number of children: Not on file  . Years of education: Not on file  . Highest education level: Not on file    Occupational History  . Occupation: Scientist, product/process development: [employer]  . Financial resource strain: Not on file  . Food insecurity:    Worry: Not on file    Inability: Not on file  . Transportation needs:    Medical: Not on file    Non-medical: Not on file  Tobacco Use  . Smoking status: Former Smoker    Last attempt to quit: 08/19/1978    Years since quitting: 39.8  . Smokeless tobacco: Never Used  . Tobacco comment: 25 years ago as of 2012  Substance and Sexual Activity  . Alcohol use: Yes    Alcohol/week: 9.0 standard drinks    Types: 9 Glasses of wine per week    Comment: wine with dinner   . Drug use: No  . Sexual activity: Never  Lifestyle  . Physical activity:    Days per week: Not on file    Minutes per session: Not on file  . Stress: Not on file  Relationships  . Social connections:    Talks on phone: Not on file    Gets together: Not on file    Attends religious service: Not on file    Active member of club or organization: Not on file    Attends meetings of clubs or organizations: Not on file    Relationship status: Not on file  . Intimate partner violence:    Fear of current or ex partner: Not on file    Emotionally abused: Not on file    Physically abused: Not on file    Forced sexual activity: Not on file  Other Topics Concern  . Not on file  Social History Narrative   epworth sleepiness scale = 8 (11/24/15)    Family History:    Family History  Problem Relation Age of Onset  . Asthma Maternal Grandmother   . Coronary artery disease Mother        dysrrhythmia  . Drug abuse Brother   . Heart Problems Father   . Cancer Neg Hx  ROS:  Please see the history of present illness.   All other ROS reviewed and negative.     Physical Exam/Data:   Vitals:   07/02/18 0630 07/02/18 0815 07/02/18 0955 07/02/18 1058  BP: (!) 147/73  (!) 154/70   Pulse: 90  (!) 106   Resp: 18  (!) 26   Temp: 98.8 F (37.1 C)  98.3 F  (36.8 C)   TempSrc: Oral  Oral   SpO2: 98% 96% 92% 93%  Height:        Intake/Output Summary (Last 24 hours) at 07/02/2018 1348 Last data filed at 07/02/2018 1000 Gross per 24 hour  Intake 1267.81 ml  Output 400 ml  Net 867.81 ml   There were no vitals filed for this visit. Body mass index is 19.91 kg/m.  General:  Thin elderly female, in no acute distress HEENT: normal Lymph: no adenopathy Neck: + JVD Endocrine:  No thryomegaly Vascular: No carotid bruits; FA pulses 2+ bilaterally  Cardiac:  normal S1, S2; RRR; no murmur  Lungs:  clear to auscultation bilaterally, no wheezing, rhonchi or rales  Abd: soft, nontender, no hepatomegaly  Ext: no edema Musculoskeletal:  No deformities Skin: warm and dry  Neuro:  Oriented to self only, residual left sided weakness Psych:  Normal affect   EKG:  No EKG done. I will order one.  Telemetry:  Telemetry was personally reviewed and demonstrates:  Sinus rhythm ~100 bpm  Relevant CV Studies:  Echocardiogram 07/01/18 Study Conclusions - Left ventricle: The cavity size was normal. Wall thickness was   increased in a pattern of mild LVH. Systolic function was   moderately to severely reduced. The estimated ejection fraction   was in the range of 30% to 35%. There is akinesis of the   mid-apicalanteroseptal, inferior, and apical myocardium. Doppler   parameters are consistent with abnormal left ventricular   relaxation (grade 1 diastolic dysfunction). - Aortic valve: There was moderate regurgitation. - Mitral valve: There was mild regurgitation. - Pulmonary arteries: Systolic pressure was moderately increased.   PA peak pressure: 54 mm Hg (S).  Impressions: - Akinesis of the distal anterior, inferior and apical walls with   overall moderate to severe LV dysfunction; consider takotsubo   cardiomyopathy; mild diastolic dysfunction; moderate AI; mild MR;   mild TR; moderate pulmonary hypertension.   Echocardiogram  11/27/2015 Study Conclusions - Left ventricle: The cavity size was normal. Wall thickness was   increased in a pattern of mild LVH. Systolic function was normal.   The estimated ejection fraction was in the range of 60% to 65%.   Doppler parameters are consistent with abnormal left ventricular   relaxation (grade 1 diastolic dysfunction). - Aortic valve: Moderately calcified annulus. There was mild   regurgitation.  Laboratory Data:  Chemistry Recent Labs  Lab 06/30/18 0316 07/01/18 0324 07/02/18 0334  NA 139 140 139  K 3.8 3.9 3.6  CL 106 105 101  CO2 29 30 33*  GLUCOSE 105* 102* 146*  BUN 24* 19 16  CREATININE 0.77 0.68 0.64  CALCIUM 8.7* 9.1 9.0  GFRNONAA >60 >60 >60  GFRAA >60 >60 >60  ANIONGAP 4* 5 5    Recent Labs  Lab 06/29/18 1555 06/30/18 0316 07/01/18 0324  PROT 6.7 5.5* 5.7*  ALBUMIN 3.7 2.7* 2.9*  AST 30 24 28   ALT 22 19 30   ALKPHOS 61 48 62  BILITOT 0.9 0.8 1.0   Hematology Recent Labs  Lab 06/30/18 0316 07/01/18 0324 07/02/18 0334  WBC 12.9* 11.0* 10.2  RBC 2.99* 3.10* 3.03*  HGB 9.6* 9.8* 9.6*  HCT 31.2* 32.7* 32.1*  MCV 104.3* 105.5* 105.9*  MCH 32.1 31.6 31.7  MCHC 30.8 30.0 29.9*  RDW 14.4 14.2 13.9  PLT 250 258 274   Cardiac EnzymesNo results for input(s): TROPONINI in the last 168 hours. No results for input(s): TROPIPOC in the last 168 hours.  BNP Recent Labs  Lab 07/01/18 1058  BNP 862.1*    DDimer No results for input(s): DDIMER in the last 168 hours.  Radiology/Studies:  Dg Chest 2 View  Result Date: 06/29/2018 CLINICAL DATA:  Increasing shortness of breath EXAM: CHEST - 2 VIEW COMPARISON:  05/20/2018 FINDINGS: Cardiac shadow is enlarged but stable. Stimulator leads are again seen and stable. The lungs are well aerated bilaterally. Bilateral small effusions are noted. Increasing parenchymal densities are seen bilaterally particularly in the right upper lobe and left mid lung. No significant vascular congestion is noted. No  acute bony abnormality is noted. IMPRESSION: Bilateral small effusions and patchy parenchymal opacities likely representing early infiltrate. Electronically Signed   By: Inez Catalina M.D.   On: 06/29/2018 15:22   Ct Chest Wo Contrast  Result Date: 07/01/2018 CLINICAL DATA:  84 year old female with shortness of breath EXAM: CT CHEST WITHOUT CONTRAST TECHNIQUE: Multidetector CT imaging of the chest was performed following the standard protocol without IV contrast. COMPARISON:  A CT scan of the chest 02/13/2017 FINDINGS: Cardiovascular: Limited evaluation in the absence of intravenous contrast. Cardiomegaly. Marked enlargement of the main pulmonary artery which measures approximately 3.5 cm in transverse diameter. Similarly, the ascending aorta is ectatic at 3.6 cm in diameter. Calcifications present along the aorta and coronary arteries. No pericardial effusion. Mediastinum/Nodes: Limited evaluation in the absence of intravenous contrast. No definitively enlarged adenopathy or mediastinal mass. Lungs/Pleura: Moderate layering right pleural effusion. Trace left pleural effusion. Advanced centrilobular pulmonary emphysema. Diffuse ground-glass attenuation airspace opacity throughout the right upper lobe and the superior segment of the right lower lobe. The asymmetry of the findings suggests pneumonia. Bilateral atelectasis. No evidence of pneumothorax. No suspicious mass or nodule. Upper Abdomen: No acute abnormality in the visualized upper abdomen. Musculoskeletal: Epidural spinal stimulator. Advanced left shoulder joint osteoarthritis. Mild right shoulder joint osteoarthritis. Stable T11 and T12 compression fractures. New compression fracture of the inferior endplate of T8 with approximately 20% height loss compared to 02/13/2017. IMPRESSION: 1. Diffuse airspace opacity throughout the right upper lobe and the superior segment of the right lower lobe concerning for multifocal pneumonia. Asymmetric pulmonary edema  is a less likely consideration. 2. Moderate layering right pleural effusion. 3. Advanced centrilobular pulmonary emphysema. 4. Cardiomegaly. 5. Pulmonary artery enlargement suggesting underlying pulmonary arterial hypertension. 6. Coronary artery calcifications. 7. Trace left pleural effusion. 8. Age indeterminate T8 compression fracture is a new finding compared to 02/13/2017. 9. Stable chronic T11 and T12 compression fractures. Aortic Atherosclerosis (ICD10-I70.0) and Emphysema (ICD10-J43.9). Electronically Signed   By: Jacqulynn Cadet M.D.   On: 07/01/2018 19:24   Dg Chest Port 1 View  Result Date: 07/01/2018 CLINICAL DATA:  Increasing shortness of breath this morning EXAM: PORTABLE CHEST 1 VIEW COMPARISON:  PA and lateral chest x-ray of June 29, 2018 FINDINGS: The lungs are less well inflated today. The interstitial markings have increased bilaterally greatest on the right. The hemidiaphragms are now obscured. The heart is top-normal in size. The pulmonary vascularity is indistinct. There is calcification in the wall of the aortic [name]. There are degenerative changes of the left  shoulder. IMPRESSION: Worsening interstitial densities bilaterally most compatible with asymmetric pulmonary edema though pneumonia could produce similar findings. Small bilateral pleural effusions have increased. Thoracic aortic atherosclerosis. Electronically Signed   By: David  Martinique M.D.   On: 07/01/2018 12:59    Assessment and Plan:   1. Acute combined systolic and diastolic heart failure -Patient was admitted with respiratory illness.  Found to have reduced EF on echocardiogram 30-35%, down from 60-65% in 2017.  Coronary calcifications noted on chest CT. -BNP was 862.1 -Chest x-ray shows worsening interstitial densities bilaterally most compatible with asymmetric pulmonary edema though pneumonia could produce similar findings.  Small bilateral pleural effusions have increased. -She had initial IV fluids given  for respiratory illness. Pt has been started on IV lasix 20 mg BID with first dose today at 12:34. Only minimal urine output documented. Her I&O is overall positive 2.7L. No weights documented.  -Low dose BB and ACE-I have been initiated -She does not look significantly volume overloaded although she does have JVD.  -Continue to diurese and optimize medical therapy.  Renal function is currently normal.   -Could possibly be stress-induced, Takotsubo cardiomyopathy or tachycardia induced as current baseline HR is 100 bpm. She has had radiation therapy for breast cancer but this was in the late 1990's and she had normal echo in 2017.  -Ideally would like to do R&L heart cath to evaluate her newly reduced LV function but not sure she is a good candidate given her dementia and poor memory.  -Strict intake and output and daily weights -Dr. Meda Coffee to see with further recommendations.   2. Hypoxic respiratory failure secondary to bilateral pneumonia -Patient admitted with fever, leukocytosis, tachycardia and tachypnea. -Management per primary team, on IV antibiotics, nebulizer therapy and IV steroids -CT of the chest showed multifocal pneumonia with right-sided moderate pleural effusion.  Per PCCM, will try diuretics prior to considering thoracentesis. -Still requiring oxygen supplementation  3. Anemia -Microcytic anemia with Hgb 9.6.  -Recommend evaluation -Hx of alcohol use in the past, none in 1.5 years per her friend.  4. Aortic Regurgitation -Moderate aortic regurgitation with mild mitral regurgitation on echo done yesterday.  For questions or updates, please contact [place] Please consult [email or web address] for contact info under   Signed, Daune Perch, NP  07/02/2018 1:48 PM   The patient was seen, examined and discussed with Daune Perch, NP-C and I agree with the above.   84 y.o. 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 Debara [name] in 2017 for preoperative evaluation, who is being seen today for the evaluation of new CHF. The patient was admitted with progressively worsening dyspnea over 2 to 3 weeks, she lives in a nursing home and walks with a walker at baseline, however had to stop in the last week as she was only able to perform few steps before she has to stop.  She denies any chest pain at any point.   Ms. Hutt was admitted to [facility] long hospital on 06/29/2018 with progressively worsening dyspnea, fever and cough.  She was started on therapy for pneumonia.   In 2017 her LVEF was 60 to 65%, echocardiogram performed yesterday shows reduction in LV systolic function with EF 30-35% with akinesis of the mid-apicalanteroseptal, inferior, and apical myocardium, down from previous echo in  2017. She has just been started on lasix, low dose BB and ACE-I.  She currently feels short of breath at rest and denies any chest pain.  On physical exam her JVDs are elevated up to her jaws, she has crackles in bilateral lungs throughout the lung fields, S1-S2 no significant murmur, good peripheral pulses with no edema in her legs. She has normal creatinine, she is macrocytic anemia, BNP elevated at 862.  Assessment and plan: Acute combined systolic diastolic CHF with significant fluid overload, I would increase Lasix to 40 mg every 6 hours IV, we will monitor I's and O's, weight potassium and creatinine closely.  Given the fact that patient has underlying dementia, she is a poor functional status, history of alcohol abuse with macrocytic anemia she is a poor candidate in addition she has never experienced chest pain and therefore that I would avoid cardiac catheterization, in this patient and treat medically for heart failure.  I  will discontinue her lisinopril and start Entresto 26/24 twice daily and uptitrate as tolerated by blood pressure, given acute heart failure I will hold off on beta-blockers.  Ena Dawley, MD 07/02/2018

## 2018-07-02 NOTE — Evaluation (Signed)
SLP Cancellation Note  Patient Details Name: Yolanda [name] MRN: [id number] DOB: November 01, 1933   Cancelled treatment:       Reason Eval/Treat Not Completed: Other (comment)(pt getting cleaned up at this time, will continue efforts)   Macario Golds 07/02/2018, 9:22 AM   Luanna Salk, [place] SLP [place] Pager [phone] Office [phone]

## 2018-07-02 NOTE — Progress Notes (Addendum)
Chaplain responding to spiritual care consult re: "POA wishes to make his mother a DNR"  Consulted with RN.  POA will need to speak with pt's MD in order to change code / DNR status.   RN reports family has spoken to MD and code status has been changed to partial.

## 2018-07-02 NOTE — Care Management Important Message (Signed)
Important Message  Patient Details  Name: Yolanda [name] MRN: [id number] Date of Birth: 1934-07-12   Medicare Important Message Given:  Yes    Kerin Salen 07/02/2018, 11:55 AMImportant Message  Patient Details  Name: Yolanda [name] MRN: [id number] Date of Birth: [birth year]   Medicare Important Message Given:  Yes    Kerin Salen 07/02/2018, 11:55 AM

## 2018-07-02 NOTE — Progress Notes (Signed)
Report called to 5E and patient transported to telemetry unit by NT.

## 2018-07-03 ENCOUNTER — Inpatient Hospital Stay (HOSPITAL_COMMUNITY): Payer: Commercial Managed Care - PPO

## 2018-07-03 LAB — CBC
HCT: 33.7 % — ABNORMAL LOW (ref 36.0–46.0)
Hemoglobin: 10.5 g/dL — ABNORMAL LOW (ref 12.0–15.0)
MCH: 31.4 pg (ref 26.0–34.0)
MCHC: 31.2 g/dL (ref 30.0–36.0)
MCV: 100.9 fL — ABNORMAL HIGH (ref 80.0–100.0)
NRBC: 0 % (ref 0.0–0.2)
Platelets: 346 10*3/uL (ref 150–400)
RBC: 3.34 MIL/uL — ABNORMAL LOW (ref 3.87–5.11)
RDW: 13.4 % (ref 11.5–15.5)
WBC: 11.7 10*3/uL — AB (ref 4.0–10.5)

## 2018-07-03 LAB — BASIC METABOLIC PANEL
ANION GAP: 7 (ref 5–15)
BUN: 17 mg/dL (ref 8–23)
CALCIUM: 9.4 mg/dL (ref 8.9–10.3)
CO2: 40 mmol/L — ABNORMAL HIGH (ref 22–32)
CREATININE: 0.71 mg/dL (ref 0.44–1.00)
Chloride: 94 mmol/L — ABNORMAL LOW (ref 98–111)
GFR calc non Af Amer: >60 mL/min (ref >60)
Glucose, Bld: 177 mg/dL — ABNORMAL HIGH (ref 70–99)
Potassium: 3.4 mmol/L — ABNORMAL LOW (ref 3.5–5.1)
SODIUM: 141 mmol/L (ref 135–145)

## 2018-07-03 LAB — MAGNESIUM: MAGNESIUM: 1.7 mg/dL (ref 1.7–2.4)

## 2018-07-03 LAB — PROCALCITONIN: PROCALCITONIN: 0.12 ng/mL

## 2018-07-03 MED ORDER — ACETAMINOPHEN 500 MG PO TABS: 1000.0000 mg | ORAL_TABLET | Freq: Four times a day (QID) | ORAL | Status: DC | PRN

## 2018-07-03 MED ORDER — FOLIC ACID 1 MG PO TABS
1.0000 mg | ORAL_TABLET | Freq: Every day | ORAL | Status: DC
  Administered 2018-07-03 – 2018-07-05 (×3): 1 mg via ORAL
  Filled 2018-07-03 (×3): qty 1

## 2018-07-03 MED ORDER — METHYLPREDNISOLONE SODIUM SUCC 40 MG IJ SOLR
40.0000 mg | Freq: Every day | INTRAMUSCULAR | Status: DC
  Administered 2018-07-04: 40 mg via INTRAVENOUS
  Filled 2018-07-03 (×2): qty 1

## 2018-07-03 MED ORDER — ENSURE ENLIVE PO LIQD
237.0000 mL | Freq: Two times a day (BID) | ORAL | Status: DC
  Administered 2018-07-03 – 2018-07-05 (×4): 237 mL via ORAL

## 2018-07-03 MED ORDER — POLYETHYLENE GLYCOL 3350 17 G PO PACK: 17.0000 g | PACK | Freq: Every day | ORAL | Status: DC | PRN

## 2018-07-03 MED ORDER — FAMOTIDINE 20 MG PO TABS
20.0000 mg | ORAL_TABLET | Freq: Two times a day (BID) | ORAL | Status: DC
  Administered 2018-07-03 – 2018-07-05 (×5): 20 mg via ORAL
  Filled 2018-07-03 (×5): qty 1

## 2018-07-03 MED ORDER — POTASSIUM CHLORIDE CRYS ER 20 MEQ PO TBCR
40.0000 meq | EXTENDED_RELEASE_TABLET | Freq: Every day | ORAL | Status: DC
  Administered 2018-07-03: 40 meq via ORAL
  Filled 2018-07-03: qty 2

## 2018-07-03 MED ORDER — GUAIFENESIN 100 MG/5ML PO SOLN
20.0000 mL | Freq: Two times a day (BID) | ORAL | Status: DC
  Administered 2018-07-03 – 2018-07-05 (×4): 400 mg via ORAL
  Filled 2018-07-03: qty 10
  Filled 2018-07-03 (×3): qty 20
  Filled 2018-07-03: qty 10

## 2018-07-03 MED ORDER — DOCUSATE SODIUM 50 MG/5ML PO LIQD: 100.0000 mg | Freq: Two times a day (BID) | ORAL | Status: DC | PRN

## 2018-07-03 MED ORDER — ALBUTEROL SULFATE (2.5 MG/3ML) 0.083% IN NEBU
2.5000 mg | INHALATION_SOLUTION | Freq: Three times a day (TID) | RESPIRATORY_TRACT | Status: DC
  Administered 2018-07-03 – 2018-07-05 (×6): 2.5 mg via RESPIRATORY_TRACT
  Filled 2018-07-03 (×6): qty 3

## 2018-07-03 MED ORDER — VITAMIN B-1 100 MG PO TABS
100.0000 mg | ORAL_TABLET | Freq: Every day | ORAL | Status: DC
  Administered 2018-07-03 – 2018-07-05 (×3): 100 mg via ORAL
  Filled 2018-07-03 (×3): qty 1

## 2018-07-03 MED ORDER — VITAMIN D3 1.25 MG (50000 UT) PO CAPS: 1.0000 | ORAL_CAPSULE | ORAL | Status: DC

## 2018-07-03 NOTE — Progress Notes (Addendum)
PROGRESS NOTE    Yolanda [name]  HDQ:[id number] DOB: 11/09/1933 DOA: 06/29/2018 PCP: Javier Glazier, MD   Brief Narrative:  84 y.o. female from [place] independent living facility with past medical history of dementia, CVA was admitted to hospital with complaints of shortness of breath, cough, fever and chills.  Patient came into the ER 2 days prior to this admission with diarrhea with dehydration and was given IV fluids and sent back to the facility.  Patient is not on oxygen at home. In the ED, received vancomycin and zosyn.  Temp of 101.4, blood pressure 139/81 pulse is 90 respiration 28 sats 93% on 5 L.  Sodium 139 potassium 3.4 BUN 25 creatinine 0.80 LFTs normal lactic acid 1.60.  White count 16.7 hemoglobin 11.2 platelet count 302.  Chest x-ray showed bilateral small effusions and patchy parenchymal opacities likely representing early infiltrate.  She was seen by pulmonary and a CT scan of the chest was performed which showed multifocal pneumonia.  A 2D echocardiogram was performed which showed LV ejection fraction of 30 to 35% with akinesis of the apical anteroseptal inferior myocardium.  Cardiology was then consulted for possible Takotsubo cardiomyopathy.  Assessment & Plan:   Active Problems:   HCAP (healthcare-associated pneumonia)   Dementia without behavioral disturbance ([place])   Hypokalemia   Acute respiratory failure with hypoxia (HCC)   Pleural effusion   Acute pulmonary edema (HCC)  Acute hypoxic respiratory failure secondary to bilateral pneumonia, pleural effusion, Takotsubo cardiomyopathy.  Patient had a fever, leukocytosis, tachycardia and tachypnea on presentation. Initially received vancomycin and cefepime.  Pulmonary is following.  Antibiotic has been changed to oral at this time.  CT scan of the chest showed multifocal pneumonia with right-sided moderate pleural effusion.  Patient has been started on diuretics with significant improvement in her breathing status.   Cardiology evaluation..  Patient is still requiring 5 L of oxygen.   Negative blood cultures 4 days.  continue nebulizer, will decrease the dose of IV steroid today.  Continue to taper. Patient might need home oxygen on discharge. Pro-calcitonin of 0.1.  BNP 862.    Takotsubo cardiomyopathy with pleural effusion and dyspnea.  Moderate aortic regurgitation and mild mitral regurgitation also noted.  Patient has been seen by cardiology.  Has been started on Entresto and IV Lasix 40 mg every 6hrly with significant urinary output.  Patient is not a candidate for left heart catheterization secondary to advanced age and dementia.  Mild hypokalemia will continue to replenish.  Continue oral potassium supplements.  Check BMP in a.m.  Right sided moderate pleural effusion.  Improved with diuresis.  Chest x-ray from 07/04/1999 and shows significant improvement in the vascular congestion after diuresis  Chronic pain syndrome  on Movantik and OxyContin ER    Dementia with behavioral issues Continue on Aricept.  Low-dose Ativan for anxiety.  History of depression  continue on Cymbalta and Prozac.  Concern for dysphagia.  Patient has been seen by speech and swallow.  Recommend regular diet with thin liquids.  History of alcohol abuse with microcytic anemia.  Appears stable at this time.  DVT prophylaxis: Lovenox  Code Status: Full code  Family Communication: I again spoke with the patient's caregiver at bedside and patient's son on the phone and updated the clinical condition of the patient.  Disposition Plan:  Likely skilled nursing facility placement in 2 to 3 days.  Physical therapy to assess the patient.  We will continue to wean oxygen.  Follow pulmonary and cardiology  recommendation.  Consultants: PCCM, cardiology  Procedures: 2D echocardiogram  Antimicrobials: Vanco and cefepime 11/11, discontinued 11/14.                             Augmentin>> 11/14  Subjective:   Patient feels much  better today with her breathing.  Has less wheezing.  Is alert awake and communicative.  Caregiver at bedside.  Objective: Vitals:   07/02/18 2014 07/03/18 0422 07/03/18 0500 07/03/18 0831  BP: 110/60 124/66    Pulse: 86 92    Resp: 16 16    Temp: 97.7 F (36.5 C) (!) 97.5 F (36.4 C)    TempSrc:  Oral    SpO2: 94% 97%  93%  Weight:   54.9 kg   Height:        Intake/Output Summary (Last 24 hours) at 07/03/2018 1336 Last data filed at 07/03/2018 1303 Gross per 24 hour  Intake 600 ml  Output 2875 ml  Net -2275 ml   Vitals:   07/03/18 0422 07/03/18 0831  BP: 124/66   Pulse: 92   Resp: 16   Temp: (!) 97.5 F (36.4 C)   SpO2: 97% 93%   Physical Examination: General exam: Appears calm and comfortable ,Not in distress.  On nasal cannula oxygen HEENT:PERRL,Oral mucosa moist Respiratory system: Bilateral equal air entry, normal vesicular breath sounds, few basal crackles noted. Cardiovascular system: S1 & S2 heard, RRR.  Gastrointestinal system: Abdomen is nondistended, soft and nontender. No organomegaly or masses felt. Normal bowel sounds heard.  External urinary catheter in place. Central nervous system: Alert awake and communicative.  Moves all extremities. Extremities: No edema, no clubbing ,no cyanosis, distal peripheral pulses palpable. Skin: No rashes, lesions or ulcers,no icterus ,no pallor MSK: Normal muscle bulk,tone ,power   Data Reviewed: I have personally reviewed following labs and imaging studies  CBC: Recent Labs  Lab 06/26/18 2208 06/29/18 1555 06/30/18 0316 07/01/18 0324 07/02/18 0334 07/03/18 0628  WBC 15.2* 16.7* 12.9* 11.0* 10.2 11.7*  NEUTROABS 12.5* 14.2*  --   --   --   --   HGB 12.4 11.2* 9.6* 9.8* 9.6* 10.5*  HCT 39.3 35.8* 31.2* 32.7* 32.1* 33.7*  MCV 99.7 100.8* 104.3* 105.5* 105.9* 100.9*  PLT 345 302 250 258 274 683   Basic Metabolic Panel: Recent Labs  Lab 06/29/18 1555 06/30/18 0316 07/01/18 0324 07/02/18 0334 07/03/18 0628   NA 139 139 140 139 141  K 3.4* 3.8 3.9 3.6 3.4*  CL 101 106 105 101 94*  CO2 29 29 30  33* 40*  GLUCOSE 145* 105* 102* 146* 177*  BUN 25* 24* 19 16 17   CREATININE 0.80 0.77 0.68 0.64 0.71  CALCIUM 9.7 8.7* 9.1 9.0 9.4  MG  --   --   --  1.7 1.7   GFR: Estimated Creatinine Clearance: 44.8 mL/min (by C-G formula based on SCr of 0.71 mg/dL). Liver Function Tests: Recent Labs  Lab 06/29/18 1555 06/30/18 0316 07/01/18 0324  AST 30 24 28   ALT 22 19 30   ALKPHOS 61 48 62  BILITOT 0.9 0.8 1.0  PROT 6.7 5.5* 5.7*  ALBUMIN 3.7 2.7* 2.9*   No results for input(s): LIPASE, AMYLASE in the last 168 hours. No results for input(s): AMMONIA in the last 168 hours. Coagulation Profile: No results for input(s): INR, PROTIME in the last 168 hours. Cardiac Enzymes: No results for input(s): CKTOTAL, CKMB, CKMBINDEX, TROPONINI in the last 168 hours. BNP (last  3 results) No results for input(s): PROBNP in the last 8760 hours. HbA1C: No results for input(s): HGBA1C in the last 72 hours. CBG: No results for input(s): GLUCAP in the last 168 hours. Lipid Profile: No results for input(s): CHOL, HDL, LDLCALC, TRIG, CHOLHDL, LDLDIRECT in the last 72 hours. Thyroid Function Tests: No results for input(s): TSH, T4TOTAL, FREET4, T3FREE, THYROIDAB in the last 72 hours. Anemia Panel: No results for input(s): VITAMINB12, FOLATE, FERRITIN, TIBC, IRON, RETICCTPCT in the last 72 hours. Sepsis Labs: Recent Labs  Lab 06/29/18 1545 07/01/18 1058 07/02/18 0334 07/03/18 0628  PROCALCITON  --  0.11 0.10 0.12  LATICACIDVEN 1.60  --   --   --     Recent Results (from the past 240 hour(s))  Blood Culture (routine x 2)     Status: None (Preliminary result)   Collection Time: 06/29/18  3:52 PM  Result Value Ref Range Status   Specimen Description   Final    BLOOD LEFT FOREARM Performed at [facility] [address], [place], [place] [address]    Special Requests   Final    BOTTLES  DRAWN AEROBIC AND ANAEROBIC Blood Culture adequate volume Performed at [facility] [address], [place], [place] [address]    Culture   Final    NO GROWTH 4 DAYS Performed at [facility], [place] [address], [place], [place] [address]    Report Status PENDING  Incomplete  Blood Culture (routine x 2)     Status: None (Preliminary result)   Collection Time: 06/29/18  3:55 PM  Result Value Ref Range Status   Specimen Description   Final    BLOOD RIGHT FOREARM Performed at [facility] [address] [place], [place] [address]    Special Requests   Final    BOTTLES DRAWN AEROBIC ONLY Blood Culture adequate volume Performed at [facility] [address], [place], [place] [address]    Culture   Final    NO GROWTH 4 DAYS Performed at [facility], [place] [address] [place], [place] [address]    Report Status PENDING  Incomplete  MRSA PCR Screening     Status: None   Collection Time: 07/02/18  2:56 PM  Result Value Ref Range Status   MRSA by PCR NEGATIVE NEGATIVE Final    Comment:        The GeneXpert MRSA Assay (FDA approved for NASAL specimens only), is one component of a comprehensive MRSA colonization surveillance program. It is not intended to diagnose MRSA infection nor to guide or monitor treatment for MRSA infections. Performed at [facility], [place] [address], [place], [place] [address]       Radiology Studies: Ct Chest Wo Contrast  Result Date: 07/01/2018 CLINICAL DATA:  84 year old female with shortness of breath EXAM: CT CHEST WITHOUT CONTRAST TECHNIQUE: Multidetector CT imaging of the chest was performed following the standard protocol without IV contrast. COMPARISON:  A CT scan of the chest 02/13/2017 FINDINGS: Cardiovascular: Limited evaluation in the absence of intravenous contrast. Cardiomegaly. Marked enlargement of the main pulmonary artery which measures approximately  3.5 cm in transverse diameter. Similarly, the ascending aorta is ectatic at 3.6 cm in diameter. Calcifications present along the aorta and coronary arteries. No pericardial effusion. Mediastinum/Nodes: Limited evaluation in the absence of intravenous contrast. No definitively enlarged adenopathy or mediastinal mass. Lungs/Pleura: Moderate layering right pleural effusion. Trace left pleural effusion. Advanced centrilobular pulmonary emphysema.  Diffuse ground-glass attenuation airspace opacity throughout the right upper lobe and the superior segment of the right lower lobe. The asymmetry of the findings suggests pneumonia. Bilateral atelectasis. No evidence of pneumothorax. No suspicious mass or nodule. Upper Abdomen: No acute abnormality in the visualized upper abdomen. Musculoskeletal: Epidural spinal stimulator. Advanced left shoulder joint osteoarthritis. Mild right shoulder joint osteoarthritis. Stable T11 and T12 compression fractures. New compression fracture of the inferior endplate of T8 with approximately 20% height loss compared to 02/13/2017. IMPRESSION: 1. Diffuse airspace opacity throughout the right upper lobe and the superior segment of the right lower lobe concerning for multifocal pneumonia. Asymmetric pulmonary edema is a less likely consideration. 2. Moderate layering right pleural effusion. 3. Advanced centrilobular pulmonary emphysema. 4. Cardiomegaly. 5. Pulmonary artery enlargement suggesting underlying pulmonary arterial hypertension. 6. Coronary artery calcifications. 7. Trace left pleural effusion. 8. Age indeterminate T8 compression fracture is a new finding compared to 02/13/2017. 9. Stable chronic T11 and T12 compression fractures. Aortic Atherosclerosis (ICD10-I70.0) and Emphysema (ICD10-J43.9). Electronically Signed   By: Jacqulynn Cadet M.D.   On: 07/01/2018 19:24   Dg Chest Port 1 View  Result Date: 07/03/2018 CLINICAL DATA:  Respiratory failure EXAM: PORTABLE CHEST 1 VIEW  COMPARISON:  07/01/2018 FINDINGS: Cardiac shadow is stable. The lungs are well aerated bilaterally. Some patchy opacities are again noted in the right upper lobe but significantly improved from the prior exam. The degree of vascular congestion interstitial edema has improved as well. Small right pleural effusion is noted. Spinal stimulator is again seen. IMPRESSION: Significant improvement in vascular congestion and interstitial edema when compare with the prior exam. Persistent opacity is noted in the right upper lobe although may be more chronic in nature. Electronically Signed   By: Inez Catalina M.D.   On: 07/03/2018 08:28   Dg Swallowing Func-speech Pathology  Result Date: 07/03/2018 Objective Swallowing Evaluation: Type of Study: MBS-Modified Barium Swallow Study  Patient Details Name: BENEDETTA SUNDSTROM MRN: [id number] Date of Birth: January 23, 1934 Today's Date: 07/03/2018 Time: SLP Start Time (ACUTE ONLY): 1240 -SLP Stop Time (ACUTE ONLY): 1300 SLP Time Calculation (min) (ACUTE ONLY): 20 min Past Medical History: Past Medical History: Diagnosis Date . Adenomatous colon polyp   tubular . Anxiety disorder   panic attacks, PMH of. Dr  Janna Arch . 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 Diverticulosis  . Empyema ([place])   Left  . Fibroids   Lupron shots . GERD (gastroesophageal reflux disease)  . High altitude sickness 2008 . Premature menopause   due to Lupron Past Surgical History: Past Surgical History: Procedure Laterality Date . BREAST LUMPECTOMY  1984  radiation , R breast . CESAREAN SECTION    X 2 . colonoscopy with polypectomy  2011  Dr  Delfin Edis . LAMINECTOMY    X3 L-Sspine . MASTECTOMY  1990  R breast . MASTECTOMY  1996  L HPI: 84 yo female adm to [facility] with pna, has h/o dementia- resides at independent living center with 24 hour caregiver.  Swallow eval ordered.    Subjective: Pt seen in radiology for MBS to objectively assess swallow function and safety. Assessment /  Plan / Recommendation CHL IP CLINICAL IMPRESSIONS 07/03/2018 Clinical Impression Pt presents with functional oropharyngeal swallow. No oral difficulty or delay, and no nasal regurgitation noted (palatal deviation noted on bedside assessment). Intermittent trace pyriform residue noted following thin liquids, however, this cleared with second dry swallow. Recommend continuing with current diet (regular/thin liquid). SLP will follow  up for education with family. Safe swallow precautions sent back to pt room with transport. SLP Visit Diagnosis Dysphagia, oropharyngeal phase (R13.12) Impact on safety and function Mild aspiration risk   CHL IP TREATMENT RECOMMENDATION 07/03/2018 Treatment Recommendations Therapy as outlined in treatment plan below   Prognosis 07/03/2018 Prognosis for Safe Diet Advancement Good Barriers to Reach Goals Cognitive deficits CHL IP DIET RECOMMENDATION 07/03/2018 SLP Diet Recommendations Regular solids;Thin liquid Liquid Administration via Cup;Straw Medication Administration Other (Comment) Compensations Minimize environmental distractions;Slow rate;Small sips/bites Postural Changes Remain semi-upright after after feeds/meals (Comment);Seated upright at 90 degrees   CHL IP OTHER RECOMMENDATIONS 07/03/2018 Oral Care Recommendations Oral care BID     CHL IP FOLLOW UP RECOMMENDATIONS 07/03/2018 Follow up Recommendations 24 hour supervision/assistance   CHL IP FREQUENCY AND DURATION 07/03/2018 Speech Therapy Frequency (ACUTE ONLY) min 1 x/week Treatment Duration 1 week      CHL IP ORAL PHASE 07/03/2018 Oral Phase WFL  CHL IP PHARYNGEAL PHASE 07/03/2018 Pharyngeal Phase WFL  CHL IP CERVICAL ESOPHAGEAL PHASE 07/03/2018 Cervical Esophageal Phase WFL Celia B. Quentin Ore [facility] [place] Speech Language Pathologist [phone] Shonna Chock 07/03/2018, 1:13 PM                Scheduled Meds: . albuterol  2.5 mg Nebulization TID  . amoxicillin-clavulanate  1 tablet Oral Q12H  . docusate sodium  100 mg  Oral BID  . donepezil  10 mg Oral QHS  . DULoxetine  20 mg Oral BID  . famotidine  20 mg Oral BID  . feeding supplement (ENSURE ENLIVE)  237 mL Oral BID BM  . FLUoxetine  20 mg Oral Daily  . folic acid  1 mg Oral Daily  . furosemide  40 mg Intravenous Q6H  . guaiFENesin  20 mL Oral BID  . [START ON 07/04/2018] methylPREDNISolone (SOLU-MEDROL) injection  40 mg Intravenous Daily  . naloxegol oxalate  12.5 mg Oral Daily  . oxyCODONE  20 mg Oral Q12H  . potassium chloride  40 mEq Oral Daily  . sacubitril-valsartan  1 tablet Oral BID  . thiamine  100 mg Oral Daily  . traZODone  50 mg Oral QHS  . Vitamin D3  1 capsule Oral Weekly   Continuous Infusions: . sodium chloride Stopped (06/30/18 0918)     LOS: 4 days    Flora Lipps, MD [facility] If 7PM-7AM, please contact night-coverage [email or web address] Password [email or web address] 07/03/2018, 1:36 PM

## 2018-07-03 NOTE — Evaluation (Signed)
Physical Therapy Evaluation Patient Details Name: Yolanda [name] MRN: [id number] DOB: March 27, 1934 Today's Date: 07/03/2018   History of Present Illness  84 y.o. female from [facility] independent living facility with past medical history of dementia, CVA was admitted 11/11with shortness of breath, cough, fever and chills  Clinical Impression  The patient is very tremulous today. Patient did participate in transfers to [facility] and to recliner. Patient's caregiver present to give information. Pt admitted with above diagnosis. Pt currently with functional limitations due to the deficits listed below (see PT Problem List).  Pt will benefit from skilled PT to increase their independence and safety with mobility to allow discharge to the venue listed below.      Follow Up Recommendations SNF    Equipment Recommendations  None recommended by PT    Recommendations for Other Services       Precautions / Restrictions Precautions Precautions: Fall Precaution Comments: incontinence      Mobility  Bed Mobility Overal bed mobility: Needs Assistance Bed Mobility: Supine to Sit     Supine to sit: Max assist     General bed mobility comments: extra time and support  ofr legs and trunk, patient is very shakey/trmbling  Transfers Overall transfer level: Needs assistance   Transfers: Sit to/from Stand;Stand Pivot Transfers Sit to Stand: Mod assist Stand pivot transfers: Mod assist       General transfer comment: assist to rise from bed and BSC with bear hug, unable to utilize Rw. multimodal cues for hand over hand cueas for transfers.  Ambulation/Gait                Stairs            Wheelchair Mobility    Modified Rankin (Stroke Patients Only)       Balance Overall balance assessment: Needs assistance Sitting-balance support: Feet supported;Bilateral upper extremity supported Sitting balance-Leahy Scale: Fair     Standing balance support: During functional  activity;Bilateral upper extremity supported Standing balance-Leahy Scale: Poor                               Pertinent Vitals/Pain Pain Assessment: Faces Faces Pain Scale: Hurts even more Pain Location: left shoulder "RTC" per patient Pain Descriptors / Indicators: Discomfort;Guarding;Grimacing Pain Intervention(s): Limited activity within patient's tolerance;Monitored during session    Home Living Family/patient expects to be discharged to:: Assisted living               Home Equipment: Walker - 4 wheels;Wheelchair - manual Additional Comments: from [facility]- has 24/7 caregiver    Prior Function Level of Independence: Needs assistance   Gait / Transfers Assistance Needed: uses Rollator with assist.           Hand Dominance        Extremity/Trunk Assessment   Upper Extremity Assessment Upper Extremity Assessment: Generalized weakness;RUE deficits/detail;LUE deficits/detail RUE Deficits / Details: tremors are significant LUE Deficits / Details: tremors, decreased control to grasp and reach    Lower Extremity Assessment Lower Extremity Assessment: Generalized weakness;LLE deficits/detail LLE Deficits / Details: decreased support    Cervical / Trunk Assessment Cervical / Trunk Assessment: Kyphotic  Communication      Cognition Arousal/Alertness: Awake/alert Behavior During Therapy: WFL for tasks assessed/performed Overall Cognitive Status: History of cognitive impairments - at baseline  General Comments: follows commands with cues and re assusrance      General Comments      Exercises     Assessment/Plan    PT Assessment Patient needs continued PT services  PT Problem List Decreased strength;Decreased activity tolerance;Decreased balance;Decreased mobility;Decreased cognition;Decreased knowledge of use of DME;Decreased safety awareness;Decreased knowledge of precautions       PT  Treatment Interventions DME instruction;Gait training;Functional mobility training;Therapeutic activities;Therapeutic exercise;Patient/family education    PT Goals (Current goals can be found in the Care Plan section)  Acute Rehab PT Goals Patient Stated Goal: get out of wet bed PT Goal Formulation: With patient/family Time For Goal Achievement: 07/17/18 Potential to Achieve Goals: Good    Frequency Min 2X/week   Barriers to discharge        Co-evaluation               AM-PAC PT "6 Clicks" Daily Activity  Outcome Measure Difficulty turning over in bed (including adjusting bedclothes, sheets and blankets)?: Unable Difficulty moving from lying on back to sitting on the side of the bed? : Unable Difficulty sitting down on and standing up from a chair with arms (e.g., wheelchair, bedside commode, etc,.)?: Unable Help needed moving to and from a bed to chair (including a wheelchair)?: Total Help needed walking in hospital room?: Total Help needed climbing 3-5 steps with a railing? : Total 6 Click Score: 6    End of Session   Activity Tolerance: Patient tolerated treatment well Patient left: in chair;with call bell/phone within reach;with nursing/sitter in room Nurse Communication: Mobility status PT Visit Diagnosis: Unsteadiness on feet (R26.81);Pain;Muscle weakness (generalized) (M62.81) Pain - Right/Left: Left Pain - part of body: Arm;Shoulder    Time: 3557-3220 PT Time Calculation (min) (ACUTE ONLY): 33 min   Charges:   PT Evaluation $PT Eval Moderate Complexity: 1 Mod PT Treatments $Therapeutic Activity: 8-22 mins        Tresa Endo PT Acute Rehabilitation Services Pager [phone] Office [phone]   Claretha Cooper 07/03/2018, 5:49 PM

## 2018-07-03 NOTE — Progress Notes (Signed)
  Speech Language Pathology Treatment: Dysphagia  Patient Details Name: JAEDYNN BOHLKEN MRN: [id number] DOB: October 16, 1933 Today's Date: 07/03/2018 Time: 3976-7341 SLP Time Calculation (min) (ACUTE ONLY): 9 min  Assessment / Plan / Recommendation Clinical Impression  Follow up with family for education regarding MBS results and recommendations. SLP reviewed information with pt's son, including review of video from the study. Safe swallow precautions were reviewed with pt/son and posted at [facility]. SLP will follow briefly for education and to assess diet tolerance.    HPI HPI: 84 yo female adm to [facility] with pna, has h/o dementia- resides at independent living center with 24 hour caregiver.  Swallow eval ordered.         SLP Plan  Continue with current plan of care       Recommendations  Diet recommendations: Regular;Thin liquid Liquids provided via: Straw;Cup Medication Administration: (as tolerated) Supervision: Patient able to self feed;Staff to assist with self feeding;Full supervision/cueing for compensatory strategies Compensations: Minimize environmental distractions;Slow rate;Small sips/bites Postural Changes and/or Swallow Maneuvers: Seated upright 90 degrees;Upright 30-60 min after meal                Oral Care Recommendations: Oral care BID Follow up Recommendations: 24 hour supervision/assistance SLP Visit Diagnosis: Dysphagia, oropharyngeal phase (R13.12) Plan: Continue with current plan of care       GO              Samrat Hayward B. Quentin Ore [facility], CCC-SLP Speech Language Pathologist [phone]  Shonna Chock 07/03/2018, 3:36 PM

## 2018-07-03 NOTE — Consult Note (Signed)
Modified Barium Swallow Progress Note  Patient Details  Name: Yolanda [name] MRN: [id number] Date of Birth: [birth year]  Today's Date: 07/03/2018  Modified Barium Swallow completed.  Full report located under Chart Review in the Imaging Section.  Brief recommendations include the following:  Clinical Impression Pt presents with functional oropharyngeal swallow.   No oral difficulty or delay, and no nasal regurgitation noted (palatal deviation noted on bedside assessment). I  ntermittent trace pyriform residue noted following thin liquids, however, this cleared with second dry swallow. Recommend continuing with current diet (regular/thin liquid). SLP will follow up for education with family.  Safe swallow precautions sent back to pt room with transport.   Swallow Evaluation Recommendations  SLP Diet Recommendations: Regular solids;Thin liquid   Liquid Administration via: Cup;Straw   Medication Administration: Other (Comment)(as tolerated)   Supervision: Patient able to self feed;Staff to assist with self feeding;Intermittent supervision to cue for compensatory strategies   Compensations: Minimize environmental distractions;Slow rate;Small sips/bites   Postural Changes: Remain semi-upright after after feeds/meals (Comment);Seated upright at 90 degrees   Oral Care Recommendations: Oral care BID       Martie Muhlbauer B. Quentin Ore, [facility] [place] Speech Language Pathologist [phone]  Shonna Chock 07/03/2018,1:16 PM

## 2018-07-03 NOTE — Progress Notes (Addendum)
Progress Note  Patient Name: Yolanda [name] Date of Encounter: 07/03/2018  Primary Cardiologist: Pixie Casino, MD   Subjective   Going ok this morning. No complaints. Family present at bedside.   Inpatient Medications    Scheduled Meds: . albuterol  2.5 mg Nebulization TID  . amoxicillin-clavulanate  1 tablet Oral Q12H  . docusate sodium  100 mg Oral BID  . donepezil  10 mg Oral QHS  . DULoxetine  20 mg Oral BID  . FLUoxetine  20 mg Oral Daily  . furosemide  40 mg Intravenous Q6H  . methylPREDNISolone (SOLU-MEDROL) injection  40 mg Intravenous Q12H  . naloxegol oxalate  12.5 mg Oral Daily  . oxyCODONE  20 mg Oral Q12H  . potassium chloride  40 mEq Oral Daily  . sacubitril-valsartan  1 tablet Oral BID  . traZODone  50 mg Oral QHS   Continuous Infusions: . sodium chloride Stopped (06/30/18 0918)   PRN Meds: sodium chloride, albuterol, cyclobenzaprine, LORazepam   Vital Signs    Vitals:   07/02/18 2014 07/03/18 0422 07/03/18 0500 07/03/18 0831  BP: 110/60 124/66    Pulse: 86 92    Resp: 16 16    Temp: 97.7 F (36.5 C) (!) 97.5 F (36.4 C)    TempSrc:  Oral    SpO2: 94% 97%  93%  Weight:   54.9 kg   Height:        Intake/Output Summary (Last 24 hours) at 07/03/2018 0945 Last data filed at 07/03/2018 0422 Gross per 24 hour  Intake 480 ml  Output 1875 ml  Net -1395 ml   Filed Weights   07/03/18 0500  Weight: 54.9 kg    Telemetry    NSR - Personally Reviewed  ECG    NSR, 97 bpm, LVH  - Personally Reviewed  Physical Exam   GEN: Elderly WF, No acute distress.   Neck: No JVD Cardiac: RRR, no murmurs, rubs, or gallops.  Respiratory: crackles at the bases GI: Soft, nontender, non-distended  MS: No edema; No deformity. Neuro:  Nonfocal  Psych: Normal affect   Labs    Chemistry Recent Labs  Lab 06/29/18 1555 06/30/18 0316 07/01/18 0324 07/02/18 0334 07/03/18 0628  NA 139 139 140 139 141  K 3.4* 3.8 3.9 3.6 3.4*  CL 101 106 105  101 94*  CO2 29 29 30  33* 40*  GLUCOSE 145* 105* 102* 146* 177*  BUN 25* 24* 19 16 17   CREATININE 0.80 0.77 0.68 0.64 0.71  CALCIUM 9.7 8.7* 9.1 9.0 9.4  PROT 6.7 5.5* 5.7*  --   --   ALBUMIN 3.7 2.7* 2.9*  --   --   AST 30 24 28   --   --   ALT 22 19 30   --   --   ALKPHOS 61 48 62  --   --   BILITOT 0.9 0.8 1.0  --   --   GFRNONAA >60 >60 >60 >60 >60  GFRAA >60 >60 >60 >60 >60  ANIONGAP 9 4* 5 5 7      Hematology Recent Labs  Lab 07/01/18 0324 07/02/18 0334 07/03/18 0628  WBC 11.0* 10.2 11.7*  RBC 3.10* 3.03* 3.34*  HGB 9.8* 9.6* 10.5*  HCT 32.7* 32.1* 33.7*  MCV 105.5* 105.9* 100.9*  MCH 31.6 31.7 31.4  MCHC 30.0 29.9* 31.2  RDW 14.2 13.9 13.4  PLT 258 274 346    Cardiac EnzymesNo results for input(s): TROPONINI in the last 168 hours. No  results for input(s): TROPIPOC in the last 168 hours.   BNP Recent Labs  Lab 07/01/18 1058  BNP 862.1*     DDimer No results for input(s): DDIMER in the last 168 hours.   Radiology    Ct Chest Wo Contrast  Result Date: 07/01/2018 CLINICAL DATA:  84 year old female with shortness of breath EXAM: CT CHEST WITHOUT CONTRAST TECHNIQUE: Multidetector CT imaging of the chest was performed following the standard protocol without IV contrast. COMPARISON:  A CT scan of the chest 02/13/2017 FINDINGS: Cardiovascular: Limited evaluation in the absence of intravenous contrast. Cardiomegaly. Marked enlargement of the main pulmonary artery which measures approximately 3.5 cm in transverse diameter. Similarly, the ascending aorta is ectatic at 3.6 cm in diameter. Calcifications present along the aorta and coronary arteries. No pericardial effusion. Mediastinum/Nodes: Limited evaluation in the absence of intravenous contrast. No definitively enlarged adenopathy or mediastinal mass. Lungs/Pleura: Moderate layering right pleural effusion. Trace left pleural effusion. Advanced centrilobular pulmonary emphysema. Diffuse ground-glass attenuation airspace  opacity throughout the right upper lobe and the superior segment of the right lower lobe. The asymmetry of the findings suggests pneumonia. Bilateral atelectasis. No evidence of pneumothorax. No suspicious mass or nodule. Upper Abdomen: No acute abnormality in the visualized upper abdomen. Musculoskeletal: Epidural spinal stimulator. Advanced left shoulder joint osteoarthritis. Mild right shoulder joint osteoarthritis. Stable T11 and T12 compression fractures. New compression fracture of the inferior endplate of T8 with approximately 20% height loss compared to 02/13/2017. IMPRESSION: 1. Diffuse airspace opacity throughout the right upper lobe and the superior segment of the right lower lobe concerning for multifocal pneumonia. Asymmetric pulmonary edema is a less likely consideration. 2. Moderate layering right pleural effusion. 3. Advanced centrilobular pulmonary emphysema. 4. Cardiomegaly. 5. Pulmonary artery enlargement suggesting underlying pulmonary arterial hypertension. 6. Coronary artery calcifications. 7. Trace left pleural effusion. 8. Age indeterminate T8 compression fracture is a new finding compared to 02/13/2017. 9. Stable chronic T11 and T12 compression fractures. Aortic Atherosclerosis (ICD10-I70.0) and Emphysema (ICD10-J43.9). Electronically Signed   By: Jacqulynn Cadet M.D.   On: 07/01/2018 19:24   Dg Chest Port 1 View  Result Date: 07/03/2018 CLINICAL DATA:  Respiratory failure EXAM: PORTABLE CHEST 1 VIEW COMPARISON:  07/01/2018 FINDINGS: Cardiac shadow is stable. The lungs are well aerated bilaterally. Some patchy opacities are again noted in the right upper lobe but significantly improved from the prior exam. The degree of vascular congestion interstitial edema has improved as well. Small right pleural effusion is noted. Spinal stimulator is again seen. IMPRESSION: Significant improvement in vascular congestion and interstitial edema when compare with the prior exam. Persistent opacity  is noted in the right upper lobe although may be more chronic in nature. Electronically Signed   By: Inez Catalina M.D.   On: 07/03/2018 08:28   Dg Chest Port 1 View  Result Date: 07/01/2018 CLINICAL DATA:  Increasing shortness of breath this morning EXAM: PORTABLE CHEST 1 VIEW COMPARISON:  PA and lateral chest x-ray of June 29, 2018 FINDINGS: The lungs are less well inflated today. The interstitial markings have increased bilaterally greatest on the right. The hemidiaphragms are now obscured. The heart is top-normal in size. The pulmonary vascularity is indistinct. There is calcification in the wall of the aortic arch. There are degenerative changes of the left shoulder. IMPRESSION: Worsening interstitial densities bilaterally most compatible with asymmetric pulmonary edema though pneumonia could produce similar findings. Small bilateral pleural effusions have increased. Thoracic aortic atherosclerosis. Electronically Signed   By: David  Martinique M.D.  On: 07/01/2018 12:59    Cardiac Studies   07/01/18 Study Conclusions  - Left ventricle: The cavity size was normal. Wall thickness was   increased in a pattern of mild LVH. Systolic function was   moderately to severely reduced. The estimated ejection fraction   was in the range of 30% to 35%. There is akinesis of the   mid-apicalanteroseptal, inferior, and apical myocardium. Doppler   parameters are consistent with abnormal left ventricular   relaxation (grade 1 diastolic dysfunction). - Aortic valve: There was moderate regurgitation. - Mitral valve: There was mild regurgitation. - Pulmonary arteries: Systolic pressure was moderately increased.   PA peak pressure: 54 mm Hg (S).  Impressions:  - Akinesis of the distal anterior, inferior and apical walls with   overall moderate to severe LV dysfunction; consider takotsubo   cardiomyopathy; mild diastolic dysfunction; moderate AI; mild MR;   mild TR; moderate pulmonary  hypertension.  Patient Profile     BERNETTA SUTLEY is a 84 y.o. 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 Louanne Belton.  Assessment & Plan    1. Acute Combined Systolic and Diastolic CHF: EF 02-33% on echo.  Not a candidate for LHC due to advanced age and dementia. Will treat medically. Better urinary response after increase in Lasix to 40 mg q6H. -1.8L out overnight. No LEE but bilateral crackles on exam which also may be due to bilateral PNA. Renal function and BP stable. K low at 3.4. Give supplemental K. Monitor. Continue Entresto (BP and renal function ok this morning). Hold off on BB due to acute pulmonary issues.    2. Bilateral PNA: management per IM.   4. Aortic Regurgitation: Moderate aortic regurgitation with mild mitral regurgitation on echo done   5. Anemia: stable. Hgb improved, up from 9.6>>10.5.   For questions or updates, please contact [place] Please consult [email or web address] for contact info under     Signed, Lyda Jester, PA-C  07/03/2018, 9:45 AM    The patient was seen, examined and discussed with Brittainy M. Rosita Fire, PA-C and I agree with the above.   Acute combined systolic diastolic CHF with significant fluid overload, she responded well to increased dose of Lasix, negative 1.5 L overnight, I would continue for another day, we will monitor I's and O's, replace potassium, crea normal. She will be starting on entresto tonight.  Given the fact that patient has underlying dementia, she is a poor functional status, history of alcohol abuse with macrocytic anemia she is a poor candidate for a cath, in addition she has never experienced chest pain and therefore  that I would avoid cardiac catheterization, in this patient and treat medically for heart failure.    Ena Dawley, MD 07/03/2018

## 2018-07-03 NOTE — Clinical Social Work Note (Signed)
Clinical Social Work Assessment  Patient Details  Name: Yolanda [name] MRN: [id number] Date of Birth: [birth year]  Date of referral:  07/03/18               Reason for consult:  Discharge Planning                Permission sought to share information with:  Family Supports, Customer service manager Permission granted to share information::  Yes, Verbal Permission Granted  Name::        Agency::  [facility]  Relationship::   Son   Contact Information:     Housing/Transportation Living arrangements for the past 2 months:  [place] of Information:  Adult Children Patient Interpreter Needed:  None Criminal Activity/Legal Involvement Pertinent to Current Situation/Hospitalization:  No - Comment as needed Significant Relationships:  Warehouse manager, Adult Children Lives with:  Facility Resident Do you feel safe going back to the place where you live?  Yes Need for family participation in patient care:  Yes (Comment)  Care giving concerns:   Patient admitted for shortness of breath and cough. Patient came into the ER 2 days prior to this admission with diarrhea with dehydration and was given IV fluids and sent back.  Patient is not on oxygen at home currently she is on 4 L of oxygen.    Social Worker assessment / plan:  CSW discussed patient discharge back to [facility] rehab. Patient at baseline requires 24/7 hour care. Patient has 2 care aides that assist patient with bathing and dressing. The patient can feed herself.The patient uses a walker but in the last 3-4 months she has been using a wheelchair.  Patient son states her right side is weak. Patient has onset early dementia which has progressed in the the past few months.  FL2 done.   Plan: SNF   Employment status:  Retired Forensic scientist:    PT Recommendations:  Anton Ruiz / Referral to community resources:     Patient/Family's Response to care:  Agreeable  and Responding well to care.   Patient/Family's Understanding of and Emotional Response to Diagnosis, Current Treatment, and Prognosis:  Patient has dementia. Patient son at bedside and has good understanding of the patient diagnosis and current treatment.   Emotional Assessment Appearance:  Developmentally appropriate Attitude/Demeanor/Rapport:    Affect (typically observed):  Accepting Orientation:  Oriented to Self Alcohol / Substance use:  Not Applicable Psych involvement (Current and /or in the community):  No (Comment)  Discharge Needs  Concerns to be addressed:  Discharge Planning Concerns Readmission within the last 30 days:  No Current discharge risk:  None Barriers to Discharge:  Continued Medical Work up   [employer], LCSW 07/03/2018, 11:43 AM

## 2018-07-04 LAB — BASIC METABOLIC PANEL
Anion gap: 7 (ref 5–15)
BUN: 21 mg/dL (ref 8–23)
CALCIUM: 9.9 mg/dL (ref 8.9–10.3)
CO2: 42 mmol/L — AB (ref 22–32)
CREATININE: 0.73 mg/dL (ref 0.44–1.00)
Chloride: 91 mmol/L — ABNORMAL LOW (ref 98–111)
GFR calc Af Amer: >60 mL/min (ref >60)
GFR calc non Af Amer: >60 mL/min (ref >60)
GLUCOSE: 105 mg/dL — AB (ref 70–99)
POTASSIUM: 3 mmol/L — AB (ref 3.5–5.1)
Sodium: 140 mmol/L (ref 135–145)

## 2018-07-04 LAB — CBC
HEMATOCRIT: 36.5 % (ref 36.0–46.0)
Hemoglobin: 11.4 g/dL — ABNORMAL LOW (ref 12.0–15.0)
MCH: 31.7 pg (ref 26.0–34.0)
MCHC: 31.2 g/dL (ref 30.0–36.0)
MCV: 101.4 fL — AB (ref 80.0–100.0)
PLATELETS: 379 10*3/uL (ref 150–400)
RBC: 3.6 MIL/uL — AB (ref 3.87–5.11)
RDW: 13.5 % (ref 11.5–15.5)
WBC: 13.6 10*3/uL — ABNORMAL HIGH (ref 4.0–10.5)
nRBC: 0 % (ref 0.0–0.2)

## 2018-07-04 LAB — CULTURE, BLOOD (ROUTINE X 2)
CULTURE: NO GROWTH
Culture: NO GROWTH
Special Requests: ADEQUATE
Special Requests: ADEQUATE

## 2018-07-04 LAB — MAGNESIUM: Magnesium: 1.7 mg/dL (ref 1.7–2.4)

## 2018-07-04 MED ORDER — POTASSIUM CHLORIDE CRYS ER 20 MEQ PO TBCR
40.0000 meq | EXTENDED_RELEASE_TABLET | Freq: Once | ORAL | Status: DC
  Filled 2018-07-04: qty 2

## 2018-07-04 MED ORDER — FUROSEMIDE 40 MG PO TABS
40.0000 mg | ORAL_TABLET | Freq: Two times a day (BID) | ORAL | Status: DC
  Administered 2018-07-04 – 2018-07-05 (×3): 40 mg via ORAL
  Filled 2018-07-04 (×3): qty 1

## 2018-07-04 MED ORDER — POTASSIUM CHLORIDE CRYS ER 20 MEQ PO TBCR
20.0000 meq | EXTENDED_RELEASE_TABLET | Freq: Every day | ORAL | Status: DC
  Administered 2018-07-04 – 2018-07-05 (×2): 20 meq via ORAL
  Filled 2018-07-04 (×2): qty 1

## 2018-07-04 MED ORDER — SACUBITRIL-VALSARTAN 49-51 MG PO TABS
1.0000 | ORAL_TABLET | Freq: Two times a day (BID) | ORAL | Status: DC
  Administered 2018-07-04 – 2018-07-05 (×3): 1 via ORAL
  Filled 2018-07-04 (×3): qty 1

## 2018-07-04 NOTE — Progress Notes (Signed)
Writer called to pt's room with pt c/o chest pain. Pt had been laid flat for bed change/cleaning. Once pt sat upright the pain subsided. EKG done per protocol for chest pain with results of NSR. Pt continues on telemetry with HR and R WNL. Will continue to closely monitor. MD notified via text.

## 2018-07-04 NOTE — Progress Notes (Signed)
Progress Note  Patient Name: Yolanda [name] Date of Encounter: 07/04/2018  Primary Cardiologist: Pixie Casino, MD   Subjective   She is feeling better this morning, feeling comfortable in horizontal position.  Inpatient Medications    Scheduled Meds: . albuterol  2.5 mg Nebulization TID  . amoxicillin-clavulanate  1 tablet Oral Q12H  . docusate sodium  100 mg Oral BID  . donepezil  10 mg Oral QHS  . DULoxetine  20 mg Oral BID  . famotidine  20 mg Oral BID  . feeding supplement (ENSURE ENLIVE)  237 mL Oral BID BM  . FLUoxetine  20 mg Oral Daily  . folic acid  1 mg Oral Daily  . furosemide  40 mg Intravenous Q6H  . guaiFENesin  20 mL Oral BID  . methylPREDNISolone (SOLU-MEDROL) injection  40 mg Intravenous Daily  . naloxegol oxalate  12.5 mg Oral Daily  . oxyCODONE  20 mg Oral Q12H  . potassium chloride  40 mEq Oral Daily  . sacubitril-valsartan  1 tablet Oral BID  . thiamine  100 mg Oral Daily  . traZODone  50 mg Oral QHS  . [START ON 07/06/2018] Vitamin D3  1 capsule Oral Weekly   Continuous Infusions: . sodium chloride Stopped (06/30/18 0918)   PRN Meds: sodium chloride, acetaminophen, albuterol, cyclobenzaprine, LORazepam, polyethylene glycol   Vital Signs    Vitals:   07/03/18 2103 07/04/18 0436 07/04/18 0500 07/04/18 0820  BP: (!) 147/74 (!) 147/75    Pulse: 98 87    Resp: (!) 22 20    Temp: 98.4 F (36.9 C) 98.6 F (37 C)    TempSrc: Oral Oral    SpO2: 99% 98%  97%  Weight:   53.9 kg   Height:        Intake/Output Summary (Last 24 hours) at 07/04/2018 0859 Last data filed at 07/03/2018 2300 Gross per 24 hour  Intake 480 ml  Output 3000 ml  Net -2520 ml   Filed Weights   07/03/18 0500 07/04/18 0500  Weight: 54.9 kg 53.9 kg    Telemetry    NSR - Personally Reviewed  ECG    NSR, 97 bpm, LVH  - Personally Reviewed  Physical Exam   GEN: Elderly WF, No acute distress.   Neck: No JVD Cardiac: RRR, no murmurs, rubs, or gallops.    Respiratory: crackles at the bases GI: Soft, nontender, non-distended  MS: No edema; No deformity. Neuro:  Nonfocal  Psych: Normal affect   Labs    Chemistry Recent Labs  Lab 06/29/18 1555 06/30/18 0316 07/01/18 0324 07/02/18 0334 07/03/18 0628  NA 139 139 140 139 141  K 3.4* 3.8 3.9 3.6 3.4*  CL 101 106 105 101 94*  CO2 29 29 30  33* 40*  GLUCOSE 145* 105* 102* 146* 177*  BUN 25* 24* 19 16 17   CREATININE 0.80 0.77 0.68 0.64 0.71  CALCIUM 9.7 8.7* 9.1 9.0 9.4  PROT 6.7 5.5* 5.7*  --   --   ALBUMIN 3.7 2.7* 2.9*  --   --   AST 30 24 28   --   --   ALT 22 19 30   --   --   ALKPHOS 61 48 62  --   --   BILITOT 0.9 0.8 1.0  --   --   GFRNONAA >60 >60 >60 >60 >60  GFRAA >60 >60 >60 >60 >60  ANIONGAP 9 4* 5 5 7      Hematology Recent Labs  Lab 07/01/18 0324 07/02/18 0334 07/03/18 0628  WBC 11.0* 10.2 11.7*  RBC 3.10* 3.03* 3.34*  HGB 9.8* 9.6* 10.5*  HCT 32.7* 32.1* 33.7*  MCV 105.5* 105.9* 100.9*  MCH 31.6 31.7 31.4  MCHC 30.0 29.9* 31.2  RDW 14.2 13.9 13.4  PLT 258 274 346    Cardiac EnzymesNo results for input(s): TROPONINI in the last 168 hours. No results for input(s): TROPIPOC in the last 168 hours.   BNP Recent Labs  Lab 07/01/18 1058  BNP 862.1*     DDimer No results for input(s): DDIMER in the last 168 hours.   Radiology    Dg Chest Port 1 View  Result Date: 07/03/2018 CLINICAL DATA:  Respiratory failure EXAM: PORTABLE CHEST 1 VIEW COMPARISON:  07/01/2018 FINDINGS: Cardiac shadow is stable. The lungs are well aerated bilaterally. Some patchy opacities are again noted in the right upper lobe but significantly improved from the prior exam. The degree of vascular congestion interstitial edema has improved as well. Small right pleural effusion is noted. Spinal stimulator is again seen. IMPRESSION: Significant improvement in vascular congestion and interstitial edema when compare with the prior exam. Persistent opacity is noted in the right upper lobe  although may be more chronic in nature. Electronically Signed   By: Inez Catalina M.D.   On: 07/03/2018 08:28   Dg Swallowing Func-speech Pathology  Result Date: 07/03/2018 Objective Swallowing Evaluation: Type of Study: MBS-Modified Barium Swallow Study  Patient Details Name: Yolanda [name] MRN: [id number] Date of Birth: October 02, 1933 Today's Date: 07/03/2018 Time: SLP Start Time (ACUTE ONLY): 1240 -SLP Stop Time (ACUTE ONLY): 1300 SLP Time Calculation (min) (ACUTE ONLY): 20 min Past Medical History: Past Medical History: Diagnosis Date . Adenomatous colon polyp   tubular . Anxiety disorder   panic attacks, PMH of. Dr  Janna Arch . 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 Diverticulosis  . Empyema ([place])   Left  . Fibroids   Lupron shots . GERD (gastroesophageal reflux disease)  . High altitude sickness 2008 . Premature menopause   due to Lupron Past Surgical History: Past Surgical History: Procedure Laterality Date . BREAST LUMPECTOMY  1984  radiation , R breast . CESAREAN SECTION    X 2 . colonoscopy with polypectomy  2011  Dr  Delfin Edis . LAMINECTOMY    X3 L-Sspine . MASTECTOMY  1990  R breast . MASTECTOMY  1996  L HPI: 84 yo female adm to [facility] with pna, has h/o dementia- resides at independent living center with 24 hour caregiver.  Swallow eval ordered.    Subjective: Pt seen in radiology for MBS to objectively assess swallow function and safety. Assessment / Plan / Recommendation CHL IP CLINICAL IMPRESSIONS 07/03/2018 Clinical Impression Pt presents with functional oropharyngeal swallow. No oral difficulty or delay, and no nasal regurgitation noted (palatal deviation noted on bedside assessment). Intermittent trace pyriform residue noted following thin liquids, however, this cleared with second dry swallow. Recommend continuing with current diet (regular/thin liquid). SLP will follow up for education with family. Safe swallow precautions sent back to pt room with  transport. SLP Visit Diagnosis Dysphagia, oropharyngeal phase (R13.12) Impact on safety and function Mild aspiration risk   CHL IP TREATMENT RECOMMENDATION 07/03/2018 Treatment Recommendations Therapy as outlined in treatment plan below   Prognosis 07/03/2018 Prognosis for Safe Diet Advancement Good Barriers to Reach Goals Cognitive deficits CHL IP DIET RECOMMENDATION 07/03/2018 SLP Diet Recommendations Regular solids;Thin liquid Liquid Administration via Cup;Straw Medication Administration Other (  Comment) Compensations Minimize environmental distractions;Slow rate;Small sips/bites Postural Changes Remain semi-upright after after feeds/meals (Comment);Seated upright at 90 degrees   CHL IP OTHER RECOMMENDATIONS 07/03/2018 Oral Care Recommendations Oral care BID     CHL IP FOLLOW UP RECOMMENDATIONS 07/03/2018 Follow up Recommendations 24 hour supervision/assistance   CHL IP FREQUENCY AND DURATION 07/03/2018 Speech Therapy Frequency (ACUTE ONLY) min 1 x/week Treatment Duration 1 week      CHL IP ORAL PHASE 07/03/2018 Oral Phase WFL  CHL IP PHARYNGEAL PHASE 07/03/2018 Pharyngeal Phase WFL  CHL IP CERVICAL ESOPHAGEAL PHASE 07/03/2018 Cervical Esophageal Phase WFL Celia B. Quentin Ore [facility], CCC-SLP Speech Language Pathologist [phone] Shonna Chock 07/03/2018, 1:13 PM               Cardiac Studies   07/01/18 Study Conclusions  - Left ventricle: The cavity size was normal. Wall thickness was   increased in a pattern of mild LVH. Systolic function was   moderately to severely reduced. The estimated ejection fraction   was in the range of 30% to 35%. There is akinesis of the   mid-apicalanteroseptal, inferior, and apical myocardium. Doppler   parameters are consistent with abnormal left ventricular   relaxation (grade 1 diastolic dysfunction). - Aortic valve: There was moderate regurgitation. - Mitral valve: There was mild regurgitation. - Pulmonary arteries: Systolic pressure was moderately increased.    PA peak pressure: 54 mm Hg (S).  Impressions:  - Akinesis of the distal anterior, inferior and apical walls with   overall moderate to severe LV dysfunction; consider takotsubo   cardiomyopathy; mild diastolic dysfunction; moderate AI; mild MR;   mild TR; moderate pulmonary hypertension.  Patient Profile     Yolanda [name] is a 84 y.o. 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 Louanne Belton.  Assessment & Plan    1. Acute Combined Systolic and Diastolic CHF: EF 72-53% on echo.  2. Bilateral PNA: management per IM.  3. Aortic Regurgitation: Moderate aortic regurgitation with mild mitral regurgitation on echo done  4. Anemia: stable. Hgb improved, up from 9.6>>10.5.   Acute combined systolic diastolic CHF with significant fluid overload, she responded well to increased dose of Lasix, negative 2.5 L overnight, he appears euvolemic, I will switch to oral Lasix 40 mg twice daily and potassium chloride 20 mEq daily.  She remains hypertensive, I will increase the dose of Entresto to 51/49 mg PO BID. She can be discharged from cardiac standpoint, we will arrange for follow-up in our clinic.  Given the fact that patient has underlying dementia, she is a poor functional status, history of alcohol abuse with macrocytic anemia she is a poor candidate for a cath, in addition she has never experienced chest pain and therefore that I would avoid cardiac catheterization, in this patient and treat medically for heart failure.    The patient can be discharged from cardiac standpoint.  Ena Dawley, MD 07/04/2018

## 2018-07-04 NOTE — Progress Notes (Signed)
Pt resting with personal sitter in room. Ativan given by writer per PRN order. Pt's anxiety better and is without c/o at this time.

## 2018-07-05 DIAGNOSIS — I5181 Takotsubo syndrome: Secondary | ICD-10-CM | POA: Diagnosis present

## 2018-07-05 DIAGNOSIS — A419 Sepsis, unspecified organism: Principal | ICD-10-CM

## 2018-07-05 LAB — BASIC METABOLIC PANEL
Anion gap: 13 (ref 5–15)
BUN: 18 mg/dL (ref 8–23)
CALCIUM: 9.9 mg/dL (ref 8.9–10.3)
CHLORIDE: 92 mmol/L — AB (ref 98–111)
CO2: 35 mmol/L — ABNORMAL HIGH (ref 22–32)
Creatinine, Ser: 0.74 mg/dL (ref 0.44–1.00)
GFR calc non Af Amer: >60 mL/min (ref >60)
Glucose, Bld: 126 mg/dL — ABNORMAL HIGH (ref 70–99)
Potassium: 3.3 mmol/L — ABNORMAL LOW (ref 3.5–5.1)
Sodium: 140 mmol/L (ref 135–145)

## 2018-07-05 LAB — CBC
HEMATOCRIT: 41.5 % (ref 36.0–46.0)
Hemoglobin: 12.8 g/dL (ref 12.0–15.0)
MCH: 31.4 pg (ref 26.0–34.0)
MCHC: 30.8 g/dL (ref 30.0–36.0)
MCV: 101.7 fL — AB (ref 80.0–100.0)
PLATELETS: 480 10*3/uL — AB (ref 150–400)
RBC: 4.08 MIL/uL (ref 3.87–5.11)
RDW: 13.9 % (ref 11.5–15.5)
WBC: 12.5 10*3/uL — AB (ref 4.0–10.5)
nRBC: 0 % (ref 0.0–0.2)

## 2018-07-05 MED ORDER — XTAMPZA ER 18 MG PO C12A: 1.0000 | EXTENDED_RELEASE_CAPSULE | Freq: Two times a day (BID) | ORAL | 0 refills | Status: DC

## 2018-07-05 MED ORDER — ACETAMINOPHEN 500 MG PO TABS: 1000.0000 mg | ORAL_TABLET | Freq: Three times a day (TID) | ORAL | 0 refills | Status: DC | PRN

## 2018-07-05 MED ORDER — POTASSIUM CHLORIDE CRYS ER 20 MEQ PO TBCR: 20.0000 meq | EXTENDED_RELEASE_TABLET | Freq: Every day | ORAL | 0 refills | Status: DC

## 2018-07-05 MED ORDER — IPRATROPIUM-ALBUTEROL 0.5-2.5 (3) MG/3ML IN SOLN: 3.0000 mL | Freq: Three times a day (TID) | RESPIRATORY_TRACT | 0 refills | Status: DC | PRN

## 2018-07-05 MED ORDER — AMOXICILLIN-POT CLAVULANATE 875-125 MG PO TABS: 1.0000 | ORAL_TABLET | Freq: Two times a day (BID) | ORAL | 0 refills | Status: AC

## 2018-07-05 MED ORDER — SACUBITRIL-VALSARTAN 49-51 MG PO TABS: 1.0000 | ORAL_TABLET | Freq: Two times a day (BID) | ORAL | 0 refills | Status: AC

## 2018-07-05 MED ORDER — FUROSEMIDE 40 MG PO TABS: 40.0000 mg | ORAL_TABLET | Freq: Two times a day (BID) | ORAL | 0 refills | Status: DC

## 2018-07-05 MED ORDER — MOVANTIK 12.5 MG PO TABS: 12.5000 mg | ORAL_TABLET | Freq: Every day | ORAL | 0 refills | Status: AC

## 2018-07-05 MED ORDER — PREDNISONE 20 MG PO TABS: 40.0000 mg | ORAL_TABLET | Freq: Every day | ORAL | 0 refills | Status: AC

## 2018-07-05 MED ORDER — PREDNISONE 20 MG PO TABS
40.0000 mg | ORAL_TABLET | Freq: Every day | ORAL | Status: DC
  Administered 2018-07-05: 40 mg via ORAL
  Filled 2018-07-05: qty 2

## 2018-07-05 NOTE — Progress Notes (Addendum)
CSW received a call from pt's son requesting an update on pt's disposition back to [facility].  Pt's son Ilona Sorrel at ph: [phone] states it is his understanding pt is to return to day to [facility].  CSW paged the provider at ph: [phone] to confirm whether D/C is appropriate today.  CSW will continue to follow for D/C needs.  Alphonse Guild. Jamari Diana, LCSW, LCAS, CSI Clinical Social Worker Ph: [phone]

## 2018-07-05 NOTE — Discharge Summary (Signed)
Physician Discharge Summary  Yolanda [name] UKG:[id number] DOB: Feb 27, 1934 DOA: 06/29/2018  PCP: Javier Glazier, MD  Admit date: 06/29/2018 Discharge date: 07/05/2018  Admitted From: Independent living Disposition: Skilled nursing facility  Recommendations for Outpatient Follow-up:  1. Follow up with MD at SNF in 1 week.  Check BMET 2. Follow-up with cardiology on 12/9 at 1:30 PM.   Equipment/Devices: Per therapy at the facility.  Being discharged on 2 L via nasal cannula   Discharge Condition: Fair CODE STATUS: Partial (no CPR or ACLS meds, okay with intubation) Diet recommendation: Heart healthy    Discharge Diagnoses:  Principal Problem:   Acute respiratory failure with hypoxia ([place])  Active Problems:  SEPSIS ([place])   HCAP (healthcare-associated pneumonia)   Dementia without behavioral disturbance (HCC)   Hypokalemia   Pleural effusion   Acute pulmonary edema ([place])   Takotsubo cardiomyopathy  Brief narrative/HPI Please refer to admission H&P for details, in brief, 84 year old female from independent living with a history of moderate to severe dementia, CVA, depression, prior history of alcohol abuse presented to the hospital with fevers, chills, shortness of breath and cough.  She had come to the ED 2 days prior to admission with diarrhea and dehydration, given IV fluids and sent back to the independent living.  Upon presentation to the ED she was septic with fever of 101.4 F, tachypneic and hypoxic requiring 5 L nasal cannula.  Lactic acid of 1.6 and WBC of 16 point 7K.  Chest x-ray showed bilateral small effusions with patchy opacities suggestive of early infiltrate.  CT of the chest showed multifocal pneumonia.  A 2D echo was done which showed low LVEF of 30-35% with akinesis of the apical and anteroseptal inferior myocardium.  Cardiology consulted.  Hospital course  Principal problem Acute respiratory failure with hypoxia (HCC) Sepsis ([place]) Secondary to  multifocal pneumonia, pleural effusion and Takotsubo cardiomyopathy. Patient met criteria for sepsis on admission.  Was placed on empiric vancomycin and cefepime.  Blood cultures without any growth.  Antibiotic transition to oral Augmentin and will complete 7 days of course after today. Also placed on IV steroid and will discontinue over the next 4 days.  Received PRN nebs. Will be discharged on 2 L via nasal cannula.  Wean oxygen as tolerated.  Takotsubo cardiomyopathy with pleural effusion. LVEF of 30-35% with akinesis of apical and anteroseptal inferior myocardium.  Moderate AR and mild MR also noted.  Seen by cardiology and patient has been diuresed quite well on IV Lasix.  Now transitioned to p.o. Lasix 30 mg twice daily.  Also started on Entresto by cardiology.  Not felt a candidate for left heart catheterization due to advanced age and dementia. We will follow-up with cardiology in about 2 weeks.  Hypokalemia Replenished in supplement added  Right-sided pleural effusion Secondary to acute systolic CHF.  Improved with diuresis (both clinically and on follow-up chest x-ray).   Chronic pain syndrome On Movantik and Xtampza  Dementia moderate to severe Continue Aricept.  History of depression Continue Cymbalta and Prozac   Consults: Cardiology, pulmonary Procedure: 2D echo, CT chest  Family communication: Son and caregiver at bedside     Discharge Instructions   Allergies as of 07/05/2018   No Known Allergies     Medication List    STOP taking these medications   albuterol (2.5 MG/3ML) 0.083% nebulizer solution Commonly known as:  PROVENTIL   cefpodoxime 200 MG tablet Commonly known as:  VANTIN   guaiFENesin 100 MG/5ML Soln Commonly known  as:  ROBITUSSIN   Oxycodone HCl 10 MG Tabs     TAKE these medications   acetaminophen 500 MG tablet Commonly known as:  TYLENOL Take 2 tablets (1,000 mg total) by mouth every 8 (eight) hours as needed for moderate  pain. What changed:    when to take this  Another medication with the same name was removed. Continue taking this medication, and follow the directions you see here.   amoxicillin-clavulanate 875-125 MG tablet Commonly known as:  AUGMENTIN Take 1 tablet by mouth every 12 (twelve) hours for 1 day.   cyclobenzaprine 5 MG tablet Commonly known as:  FLEXERIL Take 5 mg by mouth 3 (three) times daily as needed for muscle spasms.   docusate 50 MG/5ML liquid Commonly known as:  COLACE Take 10 mLs (100 mg total) by mouth 2 (two) times daily as needed for mild constipation.   donepezil 10 MG tablet Commonly known as:  ARICEPT Take 10 mg by mouth at bedtime.   DULoxetine 20 MG capsule Commonly known as:  CYMBALTA Take 20 mg by mouth 2 (two) times daily.   famotidine 20 MG tablet Commonly known as:  PEPCID Take 1 tablet (20 mg total) by mouth 2 (two) times daily.   feeding supplement (ENSURE ENLIVE) Liqd Take 237 mLs by mouth 2 (two) times daily between meals.   FLUoxetine 20 MG capsule Commonly known as:  PROZAC Take 20 mg by mouth daily.   folic acid 1 MG tablet Commonly known as:  FOLVITE Take 1 tablet (1 mg total) by mouth daily.   furosemide 40 MG tablet Commonly known as:  LASIX Take 1 tablet (40 mg total) by mouth 2 (two) times daily.   ibandronate 150 MG tablet Commonly known as:  BONIVA Take 150 mg by mouth every 30 (thirty) days.   ipratropium-albuterol 0.5-2.5 (3) MG/3ML Soln Commonly known as:  DUONEB Take 3 mLs by nebulization every 8 (eight) hours as needed (wheezing, shortness of breath). What changed:    when to take this  reasons to take this   MOVANTIK 12.5 MG Tabs tablet Generic drug:  naloxegol oxalate Take 12.5 mg by mouth daily.   multivitamin with minerals Tabs tablet Take 1 tablet by mouth daily.   naproxen sodium 220 MG tablet Commonly known as:  ALEVE Take 440 mg by mouth 2 (two) times daily as needed (pain).   polyethylene glycol  packet Commonly known as:  MIRALAX / GLYCOLAX Take 17 g by mouth daily as needed for mild constipation.   potassium chloride SA 20 MEQ tablet Commonly known as:  K-DUR,KLOR-CON Take 1 tablet (20 mEq total) by mouth daily. Start taking on:  07/06/2018   predniSONE 20 MG tablet Commonly known as:  DELTASONE Take 2 tablets (40 mg total) by mouth daily with breakfast for 4 days. Start taking on:  07/06/2018   sacubitril-valsartan 49-51 MG Commonly known as:  ENTRESTO Take 1 tablet by mouth 2 (two) times daily.   thiamine 100 MG tablet Take 1 tablet (100 mg total) by mouth daily.   traZODone 50 MG tablet Commonly known as:  DESYREL Take 1 tablet (50 mg total) by mouth at bedtime.   Vitamin D3 1.25 MG (50000 UT) Caps Take 1 capsule by mouth once a week.   XTAMPZA ER 18 MG C12a Generic drug:  oxyCODONE ER Take 1 tablet by mouth 2 (two) times daily.      Follow-up Information    Barrett, Evelene Croon, PA-C Follow up on 07/27/2018.  Specialties:  Cardiology, Radiology Why:  1:30 PM  Contact information: [address] [place] [address] [phone]          No Known Allergies      Procedures/Studies: Dg Chest 2 View  Result Date: 06/29/2018 CLINICAL DATA:  Increasing shortness of breath EXAM: CHEST - 2 VIEW COMPARISON:  05/20/2018 FINDINGS: Cardiac shadow is enlarged but stable. Stimulator leads are again seen and stable. The lungs are well aerated bilaterally. Bilateral small effusions are noted. Increasing parenchymal densities are seen bilaterally particularly in the right upper lobe and left mid lung. No significant vascular congestion is noted. No acute bony abnormality is noted. IMPRESSION: Bilateral small effusions and patchy parenchymal opacities likely representing early infiltrate. Electronically Signed   By: Inez Catalina M.D.   On: 06/29/2018 15:22   Ct Chest Wo Contrast  Result Date: 07/01/2018 CLINICAL DATA:  84 year old female with  shortness of breath EXAM: CT CHEST WITHOUT CONTRAST TECHNIQUE: Multidetector CT imaging of the chest was performed following the standard protocol without IV contrast. COMPARISON:  A CT scan of the chest 02/13/2017 FINDINGS: Cardiovascular: Limited evaluation in the absence of intravenous contrast. Cardiomegaly. Marked enlargement of the main pulmonary artery which measures approximately 3.5 cm in transverse diameter. Similarly, the ascending aorta is ectatic at 3.6 cm in diameter. Calcifications present along the aorta and coronary arteries. No pericardial effusion. Mediastinum/Nodes: Limited evaluation in the absence of intravenous contrast. No definitively enlarged adenopathy or mediastinal mass. Lungs/Pleura: Moderate layering right pleural effusion. Trace left pleural effusion. Advanced centrilobular pulmonary emphysema. Diffuse ground-glass attenuation airspace opacity throughout the right upper lobe and the superior segment of the right lower lobe. The asymmetry of the findings suggests pneumonia. Bilateral atelectasis. No evidence of pneumothorax. No suspicious mass or nodule. Upper Abdomen: No acute abnormality in the visualized upper abdomen. Musculoskeletal: Epidural spinal stimulator. Advanced left shoulder joint osteoarthritis. Mild right shoulder joint osteoarthritis. Stable T11 and T12 compression fractures. New compression fracture of the inferior endplate of T8 with approximately 20% height loss compared to 02/13/2017. IMPRESSION: 1. Diffuse airspace opacity throughout the right upper lobe and the superior segment of the right lower lobe concerning for multifocal pneumonia. Asymmetric pulmonary edema is a less likely consideration. 2. Moderate layering right pleural effusion. 3. Advanced centrilobular pulmonary emphysema. 4. Cardiomegaly. 5. Pulmonary artery enlargement suggesting underlying pulmonary arterial hypertension. 6. Coronary artery calcifications. 7. Trace left pleural effusion. 8. Age  indeterminate T8 compression fracture is a new finding compared to 02/13/2017. 9. Stable chronic T11 and T12 compression fractures. Aortic Atherosclerosis (ICD10-I70.0) and Emphysema (ICD10-J43.9). Electronically Signed   By: Jacqulynn Cadet M.D.   On: 07/01/2018 19:24   Dg Chest Port 1 View  Result Date: 07/03/2018 CLINICAL DATA:  Respiratory failure EXAM: PORTABLE CHEST 1 VIEW COMPARISON:  07/01/2018 FINDINGS: Cardiac shadow is stable. The lungs are well aerated bilaterally. Some patchy opacities are again noted in the right upper lobe but significantly improved from the prior exam. The degree of vascular congestion interstitial edema has improved as well. Small right pleural effusion is noted. Spinal stimulator is again seen. IMPRESSION: Significant improvement in vascular congestion and interstitial edema when compare with the prior exam. Persistent opacity is noted in the right upper lobe although may be more chronic in nature. Electronically Signed   By: Inez Catalina M.D.   On: 07/03/2018 08:28   Dg Chest Port 1 View  Result Date: 07/01/2018 CLINICAL DATA:  Increasing shortness of breath this morning EXAM: PORTABLE CHEST 1  VIEW COMPARISON:  PA and lateral chest x-ray of June 29, 2018 FINDINGS: The lungs are less well inflated today. The interstitial markings have increased bilaterally greatest on the right. The hemidiaphragms are now obscured. The heart is top-normal in size. The pulmonary vascularity is indistinct. There is calcification in the wall of the aortic arch. There are degenerative changes of the left shoulder. IMPRESSION: Worsening interstitial densities bilaterally most compatible with asymmetric pulmonary edema though pneumonia could produce similar findings. Small bilateral pleural effusions have increased. Thoracic aortic atherosclerosis. Electronically Signed   By: David  Martinique M.D.   On: 07/01/2018 12:59   Dg Swallowing Func-speech Pathology  Result Date:  07/03/2018 Objective Swallowing Evaluation: Type of Study: MBS-Modified Barium Swallow Study  Patient Details Name: LAVERDA STRIBLING MRN: [id number] Date of Birth: 1933-11-20 Today's Date: 07/03/2018 Time: SLP Start Time (ACUTE ONLY): 1240 -SLP Stop Time (ACUTE ONLY): 1300 SLP Time Calculation (min) (ACUTE ONLY): 20 min Past Medical History: Past Medical History: Diagnosis Date . Adenomatous colon polyp   tubular . Anxiety disorder   panic attacks, PMH of. Dr  Janna Arch . 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 Diverticulosis  . Empyema ([place])   Left  . Fibroids   Lupron shots . GERD (gastroesophageal reflux disease)  . High altitude sickness 2008 . Premature menopause   due to Lupron Past Surgical History: Past Surgical History: Procedure Laterality Date . BREAST LUMPECTOMY  1984  radiation , R breast . CESAREAN SECTION    X 2 . colonoscopy with polypectomy  2011  Dr  Delfin Edis . LAMINECTOMY    X3 L-Sspine . MASTECTOMY  1990  R breast . MASTECTOMY  1996  L HPI: 84 yo female adm [facility] with pna, has h/o dementia- resides at independent living center with 24 hour caregiver.  Swallow eval ordered.    Subjective: Pt seen in radiology for MBS to objectively assess swallow function and safety. Assessment / Plan / Recommendation CHL IP CLINICAL IMPRESSIONS 07/03/2018 Clinical Impression Pt presents with functional oropharyngeal swallow. No oral difficulty or delay, and no nasal regurgitation noted (palatal deviation noted on bedside assessment). Intermittent trace pyriform residue noted following thin liquids, however, this cleared with second dry swallow. Recommend continuing with current diet (regular/thin liquid). SLP will follow up for education with family. Safe swallow precautions sent back to pt room with transport. SLP Visit Diagnosis Dysphagia, oropharyngeal phase (R13.12) Impact on safety and function Mild aspiration risk   CHL IP TREATMENT RECOMMENDATION 07/03/2018  Treatment Recommendations Therapy as outlined in treatment plan below   Prognosis 07/03/2018 Prognosis for Safe Diet Advancement Good Barriers to Reach Goals Cognitive deficits CHL IP DIET RECOMMENDATION 07/03/2018 SLP Diet Recommendations Regular solids;Thin liquid Liquid Administration via Cup;Straw Medication Administration Other (Comment) Compensations Minimize environmental distractions;Slow rate;Small sips/bites Postural Changes Remain semi-upright after after feeds/meals (Comment);Seated upright at 90 degrees   CHL IP OTHER RECOMMENDATIONS 07/03/2018 Oral Care Recommendations Oral care BID     CHL IP FOLLOW UP RECOMMENDATIONS 07/03/2018 Follow up Recommendations 24 hour supervision/assistance   CHL IP FREQUENCY AND DURATION 07/03/2018 Speech Therapy Frequency (ACUTE ONLY) min 1 x/week Treatment Duration 1 week      CHL IP ORAL PHASE 07/03/2018 Oral Phase WFL  CHL IP PHARYNGEAL PHASE 07/03/2018 Pharyngeal Phase WFL  CHL IP CERVICAL ESOPHAGEAL PHASE 07/03/2018 Cervical Esophageal Phase WFL Celia B. Quentin Ore [facility], CCC-SLP Speech Language Pathologist [phone] Shonna Chock 07/03/2018, 1:13 PM  2D echo Study Conclusions  - Left ventricle: The cavity size was normal. Wall thickness was   increased in a pattern of mild LVH. Systolic function was   moderately to severely reduced. The estimated ejection fraction   was in the range of 30% to 35%. There is akinesis of the   mid-apicalanteroseptal, inferior, and apical myocardium. Doppler   parameters are consistent with abnormal left ventricular   relaxation (grade 1 diastolic dysfunction). - Aortic valve: There was moderate regurgitation. - Mitral valve: There was mild regurgitation. - Pulmonary arteries: Systolic pressure was moderately increased.   PA peak pressure: 54 mm Hg (S).  Impressions:  - Akinesis of the distal anterior, inferior and apical walls with   overall moderate to severe LV dysfunction; consider takotsubo    cardiomyopathy; mild diastolic dysfunction; moderate AI; mild MR;   mild TR; moderate pulmonary hypertension.   Subjective: No overnight events.  Discharge Exam: Vitals:   07/04/18 2016 07/04/18 2021  BP:    Pulse:    Resp:    Temp:    SpO2: (!) 86% 98%   Vitals:   07/04/18 1507 07/04/18 2016 07/04/18 2021 07/05/18 0500  BP:      Pulse:      Resp:      Temp:      TempSrc:      SpO2: 93% (!) 86% 98%   Weight:    54.9 kg  Height:        General: Elderly female not in distress HEENT: Moist mucosa, supple neck Chest: Clear bilaterally CVS: Normal S1 and S2, no murmurs rub or gallop GI: Soft, nondistended, nontender Musculoskeletal: Warm, no edema CNS: Alert and oriented x1-2.   The results of significant diagnostics from this hospitalization (including imaging, microbiology, ancillary and laboratory) are listed below for reference.     Microbiology: Recent Results (from the past 240 hour(s))  Blood Culture (routine x 2)     Status: None   Collection Time: 06/29/18  3:52 PM  Result Value Ref Range Status   Specimen Description   Final    BLOOD LEFT FOREARM Performed at [facility], [place] [address], [place], [place] [address]    Special Requests   Final    BOTTLES DRAWN AEROBIC AND ANAEROBIC Blood Culture adequate volume Performed at [facility] [address] [place], [place] [address]    Culture   Final    NO GROWTH 5 DAYS Performed at [facility], [place] [address], [place], [place] [address]    Report Status 07/04/2018 FINAL  Final  Blood Culture (routine x 2)     Status: None   Collection Time: 06/29/18  3:55 PM  Result Value Ref Range Status   Specimen Description   Final    BLOOD RIGHT FOREARM Performed at [facility] [address] [place], [place] [address]    Special Requests   Final    BOTTLES DRAWN AEROBIC ONLY Blood Culture adequate volume Performed at [facility] [address] [place], [place] [address]    Culture   Final    NO GROWTH 5 DAYS Performed at [facility], [place] [address], [place], [place] [address]    Report Status 07/04/2018 FINAL  Final  MRSA PCR Screening     Status: None   Collection Time: 07/02/18  2:56 PM  Result Value Ref Range Status   MRSA by PCR NEGATIVE NEGATIVE Final  Comment:        The GeneXpert MRSA Assay (FDA approved for NASAL specimens only), is one component of a comprehensive MRSA colonization surveillance program. It is not intended to diagnose MRSA infection nor to guide or monitor treatment for MRSA infections. Performed at [facility], [place] [address] [place], [place] [address]      Labs: BNP (last 3 results) Recent Labs    07/01/18 1058  BNP 812.7*   Basic Metabolic Panel: Recent Labs  Lab 07/01/18 0324 07/02/18 0334 07/03/18 0628 07/04/18 0946 07/05/18 0847  NA 140 139 141 140 140  K 3.9 3.6 3.4* 3.0* 3.3*  CL 105 101 94* 91* 92*  CO2 30 33* 40* 42* 35*  GLUCOSE 102* 146* 177* 105* 126*  BUN '19 16 17 21 18  '$ CREATININE 0.68 0.64 0.71 0.73 0.74  CALCIUM 9.1 9.0 9.4 9.9 9.9  MG  --  1.7 1.7 1.7  --    Liver Function Tests: Recent Labs  Lab 06/29/18 1555 06/30/18 0316 07/01/18 0324  AST '30 24 28  '$ ALT '22 19 30  '$ ALKPHOS 61 48 62  BILITOT 0.9 0.8 1.0  PROT 6.7 5.5* 5.7*  ALBUMIN 3.7 2.7* 2.9*   No results for input(s): LIPASE, AMYLASE in the last 168 hours. No results for input(s): AMMONIA in the last 168 hours. CBC: Recent Labs  Lab 06/29/18 1555  07/01/18 0324 07/02/18 0334 07/03/18 0628 07/04/18 0946 07/05/18 0847  WBC 16.7*   < > 11.0* 10.2 11.7* 13.6* 12.5*  NEUTROABS 14.2*  --   --   --   --   --   --   HGB 11.2*   < > 9.8* 9.6* 10.5* 11.4* 12.8  HCT 35.8*   < > 32.7* 32.1* 33.7* 36.5 41.5  MCV 100.8*   < > 105.5* 105.9* 100.9* 101.4* 101.7*  PLT 302   < > 258 274 346 379 480*   < > = values in this interval not  displayed.   Cardiac Enzymes: No results for input(s): CKTOTAL, CKMB, CKMBINDEX, TROPONINI in the last 168 hours. BNP: Invalid input(s): POCBNP CBG: No results for input(s): GLUCAP in the last 168 hours. D-Dimer No results for input(s): DDIMER in the last 72 hours. Hgb A1c No results for input(s): HGBA1C in the last 72 hours. Lipid Profile No results for input(s): CHOL, HDL, LDLCALC, TRIG, CHOLHDL, LDLDIRECT in the last 72 hours. Thyroid function studies No results for input(s): TSH, T4TOTAL, T3FREE, THYROIDAB in the last 72 hours.  Invalid input(s): FREET3 Anemia work up No results for input(s): VITAMINB12, FOLATE, FERRITIN, TIBC, IRON, RETICCTPCT in the last 72 hours. Urinalysis    Component Value Date/Time   COLORURINE AMBER (A) 06/29/2018 1555   APPEARANCEUR CLEAR 06/29/2018 1555   LABSPEC 1.026 06/29/2018 1555   PHURINE 5.0 06/29/2018 1555   GLUCOSEU NEGATIVE 06/29/2018 1555   HGBUR NEGATIVE 06/29/2018 [place] 06/29/2018 1555   BILIRUBINUR neg 01/23/2012 1445   KETONESUR 20 (A) 06/29/2018 1555   PROTEINUR 30 (A) 06/29/2018 1555   UROBILINOGEN 0.2 01/23/2012 1445   NITRITE NEGATIVE 06/29/2018 1555   LEUKOCYTESUR NEGATIVE 06/29/2018 1555   Sepsis Labs Invalid input(s): PROCALCITONIN,  WBC,  LACTICIDVEN Microbiology Recent Results (from the past 240 hour(s))  Blood Culture (routine x 2)     Status: None   Collection Time: 06/29/18  3:52 PM  Result Value Ref Range Status   Specimen Description   Final    BLOOD LEFT FOREARM Performed  at [facility], [place] [address], [place], [place] [address]    Special Requests   Final    BOTTLES DRAWN AEROBIC AND ANAEROBIC Blood Culture adequate volume Performed at [facility] [address], [place], [place] [address]    Culture   Final    NO GROWTH 5 DAYS Performed at [facility], [place] [address] [place], [place] [address]    Report Status 07/04/2018 FINAL   Final  Blood Culture (routine x 2)     Status: None   Collection Time: 06/29/18  3:55 PM  Result Value Ref Range Status   Specimen Description   Final    BLOOD RIGHT FOREARM Performed at [facility] [address] [place], [place] [address]    Special Requests   Final    BOTTLES DRAWN AEROBIC ONLY Blood Culture adequate volume Performed at [facility] [address], [place], [place] [address]    Culture   Final    NO GROWTH 5 DAYS Performed at [facility], [place] [address] [place], [place] [address]    Report Status 07/04/2018 FINAL  Final  MRSA PCR Screening     Status: None   Collection Time: 07/02/18  2:56 PM  Result Value Ref Range Status   MRSA by PCR NEGATIVE NEGATIVE Final    Comment:        The GeneXpert MRSA Assay (FDA approved for NASAL specimens only), is one component of a comprehensive MRSA colonization surveillance program. It is not intended to diagnose MRSA infection nor to guide or monitor treatment for MRSA infections. Performed at [facility], [place] [address], [place],  [address]      Time coordinating discharge: 35 minutes  SIGNED:   Louellen Molder, MD  [facility] 07/05/2018, 11:11 AM Pager   If 7PM-7AM, please contact night-coverage [email or web address] Password [email or web address]

## 2018-07-05 NOTE — Progress Notes (Signed)
CSW updated pt's son Ilona Sorrel at ph: [phone] and CSW call pt's son once arrangements have been made for transport.  RN: Please note pt's son requests a call at ph: [phone] once PTAR arrives for transport.  CSW will continue to follow for D/C needs.  Alphonse Guild. Brigitt Mcclish, LCSW, LCAS, CSI Clinical Social Worker Ph: [phone]

## 2018-07-05 NOTE — Progress Notes (Addendum)
CSW paged the provider again at ph: [phone] to confirm whether D/C is appropriate today and to request provider sign pt's FL-2.  CSW will continue to follow for D/C needs.  11:03 AM  CSW received a call from pt's provider, pt can D/C today to [facility], provider writing D/C summary and has signed FL-2.  Alphonse Guild. Hassani Sliney, LCSW, LCAS, CSI Clinical Social Worker Ph: [phone]     '

## 2018-07-05 NOTE — Progress Notes (Signed)
Per  MUST, pt's PASSAR is: [id number] A  Start date: 02/09/2017  CSW will continue to follow for D/C needs.  Alphonse Guild. Andon Villard, LCSW, LCAS, CSI Clinical Social Worker Ph: [phone]

## 2018-07-05 NOTE — Progress Notes (Signed)
       CSW updated pt's son Ilona Sorrel at ph: [phone] and RN voiced understanding pt's on woukld like to be notified PTAR has arrived.  Of note: Pt's son is bedside now.  CSW will continue to follow for D/C needs.  Alphonse Guild. Zhamir Pirro, LCSW, LCAS, CSI Clinical Social Worker Ph: [phone]

## 2018-07-05 NOTE — Clinical Social Work Placement (Signed)
   CLINICAL SOCIAL WORK PLACEMENT  NOTE  Date:  07/05/2018  Patient Details  Name: Yolanda [name] MRN: [id number] Date of Birth: December 07, 1933  Clinical Social Work is seeking post-discharge placement for this patient at the [place] level of care (*CSW will initial, date and re-position this form in  chart as items are completed):      Patient/family provided with [facility] Work Department's list of facilities offering this level of care within the geographic area requested by the patient (or if unable, by the patient's family).  Yes   Patient/family informed of their freedom to choose among providers that offer the needed level of care, that participate in Medicare, Medicaid or managed care program needed by the patient, have an available bed and are willing to accept the patient.      Patient/family informed of [facility]'s ownership interest in [facility] and [facility], as well as of the fact that they are under no obligation to receive care at these facilities.  PASRR submitted to EDS on       PASRR number received on       Existing PASRR number confirmed on 07/05/18     FL2 transmitted to all facilities in geographic area requested by pt/family on       FL2 transmitted to all facilities within larger geographic area on       Patient informed that his/her managed care company has contracts with or will negotiate with certain facilities, including the following:        Yes   Patient/family informed of bed offers received.  Patient chooses bed at [facility])     Physician recommends and patient chooses bed at      Patient to be transferred to   on 07/05/18.  Patient to be transferred to facility by [facility])     Patient family notified on 07/05/18 of transfer.  Name of family member notified:  Jacqualine Mau at ph: [phone]     PHYSICIAN       Additional Comment:     _______________________________________________ Claudine Mouton, LCSWA 07/05/2018, 1:24 PM

## 2018-07-05 NOTE — Progress Notes (Signed)
Pt is agitated, throwing food, ripping tele box off & throwing it , private sitter at the bedside and out of pts site awaiting pt to calm down. Any attempts at correcting/addressing  pts actions only cause more agitation. Telemetry box removed as pt kept taking it off for the last 2 hours , pt states "im not wearing that"

## 2018-07-05 NOTE — NC FL2 (Signed)
[place] MEDICAID FL2 LEVEL OF CARE SCREENING TOOL     IDENTIFICATION  Patient Name: Yolanda [name] Birthdate: 06/11/1934 Sex: female Admission Date (Current Location): 06/29/2018  [facility] and [place] Number:  Herbalist and Address:  [facility],  [place] [address], [place]      Provider Number: [id number]  Attending Physician Name and Address:  Louellen Molder, MD  Relative Name and Phone Number:       Current Level of Care: Hospital Recommended Level of Care: [place] Prior Approval Number:    Date Approved/Denied: 02/09/17 PASRR Number:  [id number]   Discharge Plan: SNF    Current Diagnoses: Patient Active Problem List   Diagnosis Date Noted  . Acute respiratory failure with hypoxia ([place])   . Pleural effusion   . Acute pulmonary edema (HCC)   . Dementia without behavioral disturbance ([place]) 06/29/2018  . Hypokalemia 06/29/2018  . HCAP (healthcare-associated pneumonia)   . Sepsis due to pneumonia ([place])   . Empyema lung ([place])   . Pleural effusion on left   . Acute respiratory failure with hypoxemia ([place])   . Hypoxia   . Leukocytosis   . Severe protein-calorie malnutrition ([place])   . Acute bronchitis 02/07/2017  . ETOH abuse 02/07/2017  . Fall 02/07/2017  . Coccyx pain 02/07/2017  . Abnormal EKG 11/24/2015  . Pre-operative cardiovascular examination 11/24/2015  . Non-compliant behavior 06/23/2015  . ARTHRALGIA 05/14/2010  . SIGMOID POLYP 10/10/2009  . CARPAL TUNNEL SYNDROME 05/24/2009  . FASTING HYPERGLYCEMIA 05/24/2009  . ASTHMA 05/19/2008  . GERD 05/19/2008  . ELEVATED BLOOD PRESSURE WITHOUT DIAGNOSIS OF HYPERTENSION 05/19/2008  . TRIGGER FINGER, RIGHT MIDDLE 10/05/2007  . DUPUYTREN'S CONTRACTURE, RIGHT 10/05/2007    Orientation RESPIRATION BLADDER Height & Weight     Self, Time, Situation, Place  O2(Aeresol Mask 8l/min) Incontinent Weight: 121 lb 0.5 oz (54.9 kg) Height:  5\' 3"  (160 cm)   BEHAVIORAL SYMPTOMS/MOOD NEUROLOGICAL BOWEL NUTRITION STATUS      Continent Diet(Diet Heart Room service appropriate? Yes; Fluid consistency: Thin; Fluid restriction: 1500 mL Fluid Diet effective now (11/16) )  AMBULATORY STATUS COMMUNICATION OF NEEDS Skin   Limited Assist Verbally Normal                       Personal Care Assistance Level of Assistance  Bathing, Dressing Bathing Assistance: Limited assistance   Dressing Assistance: Limited assistance     Functional Limitations Info             SPECIAL CARE FACTORS FREQUENCY  PT (By licensed PT), OT (By licensed OT)     PT Frequency: 5 OT Frequency: 5            Contractures Contractures Info: Not present    Additional Factors Info  Code Status, Allergies Code Status Info: PARTIAL Allergies Info: No Known Allergies           Current Medications (07/05/2018):  This is the current hospital active medication list Current Facility-Administered Medications  Medication Dose Route Frequency Provider Last Rate Last Dose  . 0.9 %  sodium chloride infusion   Intravenous PRN Georgette Shell, MD   Stopped at 06/30/18 [phone]  . acetaminophen (TYLENOL) tablet 1,000 mg  1,000 mg Oral Q6H PRN Pokhrel, Laxman, MD      . albuterol (PROVENTIL) (2.5 MG/3ML) 0.083% nebulizer solution 2.5 mg  2.5 mg Nebulization Q4H PRN Pokhrel, Laxman, MD      . albuterol (PROVENTIL) (  2.5 MG/3ML) 0.083% nebulizer solution 2.5 mg  2.5 mg Nebulization TID Pokhrel, Laxman, MD   2.5 mg at 07/04/18 2015  . amoxicillin-clavulanate (AUGMENTIN) 875-125 MG per tablet 1 tablet  1 tablet Oral Q12H Ollis, Brandi L, NP   1 tablet at 07/05/18 0950  . cyclobenzaprine (FLEXERIL) tablet 5 mg  5 mg Oral TID PRN Georgette Shell, MD   5 mg at 06/29/18 2129  . docusate sodium (COLACE) capsule 100 mg  100 mg Oral BID Ollis, Brandi L, NP   100 mg at 07/05/18 0950  . donepezil (ARICEPT) tablet 10 mg  10 mg Oral QHS Georgette Shell, MD   10 mg at 07/04/18  2320  . DULoxetine (CYMBALTA) DR capsule 20 mg  20 mg Oral BID Georgette Shell, MD   20 mg at 07/05/18 0626  . famotidine (PEPCID) tablet 20 mg  20 mg Oral BID Pokhrel, Laxman, MD   20 mg at 07/05/18 0950  . feeding supplement (ENSURE ENLIVE) (ENSURE ENLIVE) liquid 237 mL  237 mL Oral BID BM Pokhrel, Laxman, MD   237 mL at 07/05/18 0952  . FLUoxetine (PROZAC) capsule 20 mg  20 mg Oral Daily Georgette Shell, MD   20 mg at 07/05/18 0950  . folic acid (FOLVITE) tablet 1 mg  1 mg Oral Daily Pokhrel, Laxman, MD   1 mg at 07/05/18 0950  . furosemide (LASIX) tablet 40 mg  40 mg Oral BID Dorothy Spark, MD   40 mg at 07/05/18 9485  . guaiFENesin (ROBITUSSIN) 100 MG/5ML solution 400 mg  20 mL Oral BID Pokhrel, Laxman, MD   400 mg at 07/05/18 0952  . LORazepam (ATIVAN) injection 0.5 mg  0.5 mg Intravenous Q6H PRN Georgette Shell, MD   0.5 mg at 07/04/18 1702  . naloxegol oxalate (MOVANTIK) tablet 12.5 mg  12.5 mg Oral Daily Georgette Shell, MD   12.5 mg at 07/05/18 4627  . oxyCODONE (OXYCONTIN) 12 hr tablet 20 mg  20 mg Oral Q12H Georgette Shell, MD   20 mg at 07/05/18 0950  . polyethylene glycol (MIRALAX / GLYCOLAX) packet 17 g  17 g Oral Daily PRN Pokhrel, Laxman, MD      . potassium chloride SA (K-DUR,KLOR-CON) CR tablet 20 mEq  20 mEq Oral Daily Dorothy Spark, MD   20 mEq at 07/05/18 0950  . potassium chloride SA (K-DUR,KLOR-CON) CR tablet 40 mEq  40 mEq Oral Once Pokhrel, Laxman, MD      . predniSONE (DELTASONE) tablet 40 mg  40 mg Oral Q breakfast Dhungel, Nishant, MD      . sacubitril-valsartan (ENTRESTO) 49-51 mg per tablet  1 tablet Oral BID Dorothy Spark, MD   1 tablet at 07/05/18 0951  . thiamine (VITAMIN B-1) tablet 100 mg  100 mg Oral Daily Pokhrel, Laxman, MD   100 mg at 07/05/18 0950  . traZODone (DESYREL) tablet 50 mg  50 mg Oral QHS Georgette Shell, MD   50 mg at 07/04/18 2320  . [START ON 07/06/2018] Vitamin D3 CAPS 1 capsule  1 capsule Oral  Weekly Pokhrel, Laxman, MD         Discharge Medications: Please see discharge summary for a list of discharge medications.  Relevant Imaging Results:  Relevant Lab Results:   Additional Information [id number]  Alphonse Guild Antonie Borjon, LCSWA

## 2018-07-05 NOTE — Progress Notes (Signed)
Report called to John at [facility].  All questions answered.

## 2018-07-05 NOTE — Progress Notes (Addendum)
CSW received a call from [facility]  Pt has been accepted by: [facility] Number for report is: [phone] Pt's unit/room/bed number will be: [id number] Accepting physician: SNF MD   Pt can arrive ASAP on 07/05/18  CSW updated RN and EDP.   CSW awaiting D/C summary from provider to send to [facility].  Alphonse Guild. Jemma Rasp, LCSW, LCAS, CSI Clinical Social Worker Ph: [phone]  '

## 2018-07-05 NOTE — Progress Notes (Signed)
PTAR called  

## 2018-07-05 NOTE — Progress Notes (Signed)
CSW awaiting D/C summary from provider to facilitate D/C back to [facility] SNF.  CSW will continue to follow for D/C needs.  Alphonse Guild. Saban Heinlen, LCSW, LCAS, CSI Clinical Social Worker Ph: [phone]

## 2018-07-24 NOTE — Progress Notes (Signed)
Cardiology Office Note   Date:  07/27/2018   ID:  Yolanda [name], DOB 21-Apr-1934, MRN [id number]  PCP:  Javier Glazier, MD Cardiologist:  Pixie Casino, MD 11/29/2015 Rosaria Ferries, PA-C   No chief complaint on file.   History of Present Illness: Yolanda [name] is a 84 y.o. 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  Yolanda [name] presents for cardiology follow up.  She has an independent living apartment at [facility]. Currently, she is in rehab there. She has done well, expects to get back to independent living soon. She will have an aide at that time.   She is trying to gain strength and mobility. She is doing much better. She is willing to continue to exercise.   She has not changed her eating habits. She was not drinking enough liquid and admits to being a "salt hound".   Does not wake w/ LE edema, no orthopnea or PND. 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    Past Medical History:  Diagnosis Date  . Adenomatous colon polyp    tubular  . Anxiety disorder    panic attacks, PMH of. Dr  Janna Arch  . 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 Diverticulosis   . Empyema ([place])    Left   . Fibroids    Lupron shots  . GERD (gastroesophageal reflux disease)   . High altitude sickness 2008  . Premature menopause    due to Lupron    Past Surgical History:  Procedure Laterality Date  . BREAST LUMPECTOMY  1984   radiation , R breast  . CESAREAN SECTION     X 2  . colonoscopy with  polypectomy  2011   Dr  Delfin Edis  . LAMINECTOMY     X3 L-Sspine  . MASTECTOMY  1990   R breast  . MASTECTOMY  1996   L    Current Outpatient Medications  Medication Sig Dispense Refill  . acetaminophen (TYLENOL) 500 MG tablet Take 1,000 mg by mouth every 8 (eight) hours as needed.    . Cholecalciferol (VITAMIN D3) 1.25 MG (50000 UT) CAPS Take 1 capsule by mouth once a week.   0  . cyclobenzaprine (FLEXERIL) 5 MG tablet Take 5 mg by mouth 3 (three) times daily as needed for muscle spasms.   0  . docusate (COLACE) 50 MG/5ML liquid Take 10 mLs (100 mg total) by mouth 2 (two) times daily as needed for mild constipation. 100 mL 0  . donepezil (ARICEPT) 10 MG tablet Take 10 mg by mouth at bedtime.   1  . DULoxetine (CYMBALTA) 60 MG capsule Take 1 capsule by mouth daily.  5  . famotidine (PEPCID) 20 MG tablet Take 1 tablet (20 mg total) by mouth 2 (two) times daily. 60 tablet 0  . feeding supplement, ENSURE ENLIVE, (ENSURE ENLIVE) LIQD Take 237 mLs by mouth 2 (two) times daily between meals. 301 mL 12  . folic acid (FOLVITE) 1 MG tablet Take  1 tablet (1 mg total) by mouth daily. 30 tablet 0  . furosemide (LASIX) 40 MG tablet Take 1 tablet (40 mg total) by mouth 2 (two) times daily. 60 tablet 0  . ibandronate (BONIVA) 150 MG tablet Take 150 mg by mouth every 30 (thirty) days.   3  . LORazepam (ATIVAN) 0.5 MG tablet Take 0.5 mg by mouth every 6 (six) hours as needed for anxiety.    Marland Kitchen MOVANTIK 12.5 MG TABS tablet Take 1 tablet (12.5 mg total) by mouth daily. 10 tablet 0  . Multiple Vitamin (MULTIVITAMIN WITH MINERALS) TABS tablet Take 1 tablet by mouth daily. 30 tablet 0  . naproxen sodium (ALEVE) 220 MG tablet Take 440 mg by mouth 2 (two) times daily as needed (pain).    . polyethylene glycol (MIRALAX / GLYCOLAX) packet Take 17 g by mouth daily as needed for mild constipation.    . potassium chloride SA (K-DUR,KLOR-CON) 20 MEQ tablet Take 1 tablet (20 mEq total) by mouth daily. 10 tablet 0  .  sacubitril-valsartan (ENTRESTO) 49-51 MG Take 1 tablet by mouth 2 (two) times daily. 60 tablet 0  . thiamine 100 MG tablet Take 1 tablet (100 mg total) by mouth daily. 30 tablet 0  . traZODone (DESYREL) 50 MG tablet Take 1 tablet (50 mg total) by mouth at bedtime. 15 tablet 0  . XTAMPZA ER 18 MG C12A Take 1 tablet by mouth 2 (two) times daily. 10 each 0   No current facility-administered medications for this visit.     Allergies:   Patient has no known allergies.    Social History:  The patient  reports that she quit smoking about 39 years ago. She has never used smokeless tobacco. She reports that she drinks about 9.0 standard drinks of alcohol per week. She reports that she does not use drugs.   Family History:  The patient's family history includes Asthma in her maternal grandmother; Coronary artery disease in her mother; Drug abuse in her brother; Heart Problems in her father.  She indicated that the status of her mother is unknown. She indicated that the status of her father is unknown. She indicated that the status of her brother is unknown. She indicated that the status of her maternal grandmother is unknown. She indicated that the status of her neg hx is unknown.   ROS:  Please see the history of present illness. All other systems are reviewed and negative.    PHYSICAL EXAM: VS:  BP 97/63   Pulse 96   Ht 5\' 3"  (1.6 m)   Wt 113 lb 9.6 oz (51.5 kg)   SpO2 92%   BMI 20.12 kg/m  , BMI Body mass index is 20.12 kg/m. GEN: Well nourished, well developed, female in no acute distress HEENT: normal for age  Neck: minimal JVD, no carotid bruit, no masses Cardiac: RRR; no murmur, no rubs, or gallops Respiratory: rales bases bilaterally, R>L, normal work of breathing GI: soft, nontender, nondistended, + BS MS: no deformity or atrophy; no edema; distal pulses are 2+ in all 4 extremities  Skin: warm and dry, no rash Neuro:  Strength and sensation are intact Psych: euthymic mood, full  affect   EKG:  EKG is not ordered today.  ECHO: 07/01/18 Study Conclusions  - Left ventricle: The cavity size was normal. Wall thickness was increased in a pattern of mild LVH. Systolic function was moderately to severely reduced. The estimated ejection fraction was in the range of 30% to 35%. There  is akinesis of the mid-apicalanteroseptal, inferior, and apical myocardium. Doppler parameters are consistent with abnormal left ventricular relaxation (grade 1 diastolic dysfunction). - Aortic valve: There was moderate regurgitation. - Mitral valve: There was mild regurgitation. - Pulmonary arteries: Systolic pressure was moderately increased. PA peak pressure: 54 mm Hg (S). Impressions: - Akinesis of the distal anterior, inferior and apical walls with overall moderate to severe LV dysfunction; consider takotsubo cardiomyopathy; mild diastolic dysfunction; moderate AI; mild MR; mild TR; moderate pulmonary hypertension   Recent Labs: 07/01/2018: ALT 30; B Natriuretic Peptide 862.1 07/04/2018: Magnesium 1.7 07/05/2018: BUN 18; Creatinine, Ser 0.74; Hemoglobin 12.8; Platelets 480; Potassium 3.3; Sodium 140  CBC    Component Value Date/Time   WBC 12.5 (H) 07/05/2018 0847   RBC 4.08 07/05/2018 0847   HGB 12.8 07/05/2018 0847   HCT 41.5 07/05/2018 0847   PLT 480 (H) 07/05/2018 0847   MCV 101.7 (H) 07/05/2018 0847   MCH 31.4 07/05/2018 0847   MCHC 30.8 07/05/2018 0847   RDW 13.9 07/05/2018 0847   LYMPHSABS 0.9 06/29/2018 1555   MONOABS 1.5 (H) 06/29/2018 1555   EOSABS 0.0 06/29/2018 1555   BASOSABS 0.0 06/29/2018 1555   CMP Latest Ref Rng & Units 07/05/2018 07/04/2018 07/03/2018  Glucose 70 - 99 mg/dL 126(H) 105(H) 177(H)  BUN 8 - 23 mg/dL 18 21 17   Creatinine 0.44 - 1.00 mg/dL 0.74 0.73 0.71  Sodium 135 - 145 mmol/L 140 140 141  Potassium 3.5 - 5.1 mmol/L 3.3(L) 3.0(L) 3.4(L)  Chloride 98 - 111 mmol/L 92(L) 91(L) 94(L)  CO2 22 - 32 mmol/L 35(H) 42(H)  40(H)  Calcium 8.9 - 10.3 mg/dL 9.9 9.9 9.4  Total Protein 6.5 - 8.1 g/dL - - -  Total Bilirubin 0.3 - 1.2 mg/dL - - -  Alkaline Phos 38 - 126 U/L - - -  AST 15 - 41 U/L - - -  ALT 0 - 44 U/L - - -     Lipid Panel Lab Results  Component Value Date   CHOL 191 07/29/2011   HDL 80.00 07/29/2011   LDLCALC 91 07/29/2011   TRIG 100.0 07/29/2011   CHOLHDL 2 07/29/2011      Wt Readings from Last 3 Encounters:  07/27/18 113 lb 9.6 oz (51.5 kg)  07/05/18 121 lb 0.5 oz (54.9 kg)  02/20/17 112 lb 6.4 oz (51 kg)     Other studies Reviewed: Additional studies/ records that were reviewed today include: Office notes, hospital records and testing.  ASSESSMENT AND PLAN:   1.  Chronic combined systolic and diastolic CHF: - Her volume status is good by exam, dry weight 113-115 pounds - I will decrease the Lasix to 40 mg daily as her intake of liquids tends to be low - Daily weights, record, and fax to the office every 2 weeks -Check BMET today to make sure renal function is tolerating the Lasix and her electrolytes are normal -Continue daily weights when she goes back to independent living  2.  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 - Recheck a two-view chest x-ray to make sure that she does not again have an effusion and that the pneumonia has completely resolved - Hopefully, her oxygenation will improve  3.  Cardiomyopathy: -She is on Lasix 40 mg twice daily and Entresto 49-51. -No other medications affecting her blood pressure. -Her blood pressure is low normal today with a systolic blood pressure in the 90s - Hopefully, decreasing the Lasix dose will help her blood pressure be a little higher.  However, she is not  having orthostatic symptoms and she is very petite. - I am okay with a systolic blood pressure in the 90s as long as she is asymptomatic.  -However, I am unable to add a beta-blocker or Spironolactone   Current medicines are reviewed at length with the patient today.  The patient has concerns regarding medicines.  The following changes have been made: Decrease Lasix  Labs/ tests ordered today include:   Orders Placed This Encounter  Procedures  . DG Chest 2 View  . Basic metabolic panel    Disposition:   FU with Pixie Casino, MD  Signed, Rosaria Ferries, PA-C  07/27/2018 3:31 PM    [facility] Phone: [phone]; Fax: [phone]

## 2018-07-27 ENCOUNTER — Ambulatory Visit (INDEPENDENT_AMBULATORY_CARE_PROVIDER_SITE_OTHER): Payer: Commercial Managed Care - PPO | Admitting: Physician Assistant

## 2018-07-27 ENCOUNTER — Encounter: Payer: Self-pay | Admitting: Physician Assistant

## 2018-07-27 VITALS — BP 97/63 | HR 96 | Ht 63.0 in | Wt 113.6 lb

## 2018-07-27 DIAGNOSIS — R0902 Hypoxemia: Secondary | ICD-10-CM | POA: Diagnosis not present

## 2018-07-27 DIAGNOSIS — I5042 Chronic combined systolic (congestive) and diastolic (congestive) heart failure: Secondary | ICD-10-CM

## 2018-07-27 DIAGNOSIS — I5181 Takotsubo syndrome: Secondary | ICD-10-CM | POA: Diagnosis not present

## 2018-07-27 MED ORDER — FUROSEMIDE 40 MG PO TABS: 40.0000 mg | ORAL_TABLET | Freq: Every day | ORAL | 0 refills | Status: AC

## 2018-07-27 NOTE — Patient Instructions (Signed)
Medication Instructions:  Change LASIX to 40 mg daily. No change in potassium at this time.  If you need a refill on your cardiac medications before your next appointment, please call your pharmacy.   Lab work: BMET If you have labs (blood work) drawn today and your tests are completely normal, you will receive your results only by: Marland Kitchen MyChart Message (if you have MyChart) OR . A paper copy in the mail If you have any lab test that is abnormal or we need to change your treatment, we will call you to review the results.  Testing/Procedures: Chest xray - Your physician has requested that you have a chest xray, is a fast and painless imaging test that uses certain electromagnetic waves to create pictures of the structures in and around your chest. This test can help diagnose and monitor conditions such as pneumonia and other lung issues his will be done at [place] [address], [place]. If you should need to call them their phone number is [phone].   Follow-Up: At [facility], you and your health needs are our priority.  As part of our continuing mission to provide you with exceptional heart care, we have created designated Provider Care Teams.  These Care Teams include your primary Cardiologist (physician) and Advanced Practice Providers (APPs -  Physician Assistants and Nurse Practitioners) who all work together to provide you with the care you need, when you need it. You will need a follow up appointment in 1 month. You may see Pixie Casino, MD or one of the following Advanced Practice Providers on your designated Care Team: [place], [place] . Fabian Sharp, PA-C  Any Other Special Instructions Will Be Listed Below (If Applicable). If you become lightheaded, or dizzy when standing please call and let us know.

## 2018-07-28 LAB — BASIC METABOLIC PANEL
BUN/Creatinine Ratio: 15 (ref 12–28)
BUN: 21 mg/dL (ref 8–27)
CALCIUM: 10.5 mg/dL — AB (ref 8.7–10.3)
CO2: 29 mmol/L (ref 20–29)
Chloride: 95 mmol/L — ABNORMAL LOW (ref 96–106)
Creatinine, Ser: 1.41 mg/dL — ABNORMAL HIGH (ref 0.57–1.00)
GFR calc Af Amer: 40 mL/min/{1.73_m2} — ABNORMAL LOW (ref >59)
GFR, EST NON AFRICAN AMERICAN: 35 mL/min/{1.73_m2} — AB (ref >59)
Glucose: 100 mg/dL — ABNORMAL HIGH (ref 65–99)
POTASSIUM: 5.3 mmol/L — AB (ref 3.5–5.2)
Sodium: 139 mmol/L (ref 134–144)

## 2018-08-18 ENCOUNTER — Telehealth: Payer: Self-pay

## 2018-08-18 NOTE — Telephone Encounter (Signed)
Contacted patient to do follow up to ask about CXR that Rosaria Ferries, PA-C ordered on 07/27/2018. Image has not been completed. Left message for patient to contact office.

## 2018-08-20 NOTE — Telephone Encounter (Signed)
Received call from patient's Ilona Sorrel; he stated patient completed her cxr and wanted to know if we got the results. He stated the test was completed and he would have the facility send a copy of the report.

## 2018-08-27 ENCOUNTER — Telehealth: Payer: Self-pay | Admitting: *Deleted

## 2018-08-27 NOTE — Telephone Encounter (Signed)
From: Yolanda [name]  Sent: 08/27/2018 11:19 AM EST  To: Cv Div Nl Scheduling  Subject: Appointment Request                 Appointment Request From: Yolanda [name]    With Provider: Rosaria Ferries, PA-C [facility]]    Preferred Date Range: 08/31/2018 - 09/04/2018    Preferred Times: Any time    Reason for visit: Request an Appointment    Comments:  Need to check heart function. Having trouble some days with energy level. Per Jacqualine Mau (son) [phone]

## 2018-08-27 NOTE — Telephone Encounter (Signed)
Spoke with pt son, he reports for the last couple weeks the patient has been having trouble with SOB. He reports when she is sitting in the chair you can hear her wheezing and when she is up she tires out very quickly. She is doing PT and her oxygen saturation while on oxygen will drop to 86-69%. They will have to increase her oxygen to 3 liters to get it back up into the 90's. He would like to have her checked out, he is not sure if this is her lungs or heart. He does not feel she can wait for the follow up with hilty 09-09-18. Follow up scheduled first of the week to accommidate transportation issues.

## 2018-08-27 NOTE — Telephone Encounter (Signed)
Received CXR in Dr. Lysbeth Penner mail bin. Given to [place] as this was ordered by [place] PA

## 2018-08-30 NOTE — Progress Notes (Signed)
Cardiology Office Note   Date:  08/31/2018   ID:  Yolanda [name], DOB 1933-12-18, MRN [id number]  PCP:  Javier Glazier, MD  Cardiologist:  Dr. Debara Pickett Chief Complaint  Patient presents with  . Cardiomyopathy  . Tachycardia     History of Present Illness: Yolanda [name] is a 85 y.o. 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  She was seen last by Rosaria Ferries, PA on 07/27/2018 and was doing well and currently living at an [facility]isted [facility] for rehab. She admitted to heavy salt intake.   She called our office on 08/27/2018 with complaints of worsening SOB, wheezing and easily fatigability. Her O2 was increased to 3 liters. She was scheduled to be seen today for follow up earlier than planned appointment.   She comes today with her son. She has mild dementia. Son reports that staff has noticed worsening breathing status and elevated HR while she is at rest and with mild PT. She denies rapid HR, dyspnea. She states she is eating and drinking well. Denies pain.   Past Medical History:  Diagnosis Date  . Adenomatous colon polyp    tubular  . Anxiety disorder    panic attacks, PMH of. Dr  Janna Arch  . 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 Diverticulosis   . Empyema ([place])    Left   . Fibroids    Lupron shots  . GERD (gastroesophageal reflux disease)   . High altitude sickness 2008  . Premature menopause    due to Lupron    Past Surgical History:  Procedure Laterality Date  . BREAST LUMPECTOMY  1984   radiation , R breast  . CESAREAN SECTION     X 2  . colonoscopy with polypectomy  2011   Dr  Delfin Edis  . LAMINECTOMY     X3 L-Sspine  . MASTECTOMY  1990   R breast  . MASTECTOMY  1996   L     Current Outpatient  Medications  Medication Sig Dispense Refill  . acetaminophen (TYLENOL) 500 MG tablet Take 1,000 mg by mouth every 8 (eight) hours as needed.    . Cholecalciferol (VITAMIN D3) 1.25 MG (50000 UT) CAPS Take 1 capsule by mouth once a week.   0  . cyclobenzaprine (FLEXERIL) 5 MG tablet Take 5 mg by mouth 3 (three) times daily as needed for muscle spasms.   0  . docusate (COLACE) 50 MG/5ML liquid Take 10 mLs (100 mg total) by mouth 2 (two) times daily as needed for mild constipation. 100 mL 0  . donepezil (ARICEPT) 10 MG tablet Take 10 mg by mouth at bedtime.   1  . DULoxetine (CYMBALTA) 60 MG capsule Take 1 capsule by mouth daily.  5  . famotidine (PEPCID) 20 MG tablet Take 1 tablet (20 mg total) by mouth 2 (two) times daily. 60 tablet 0  . feeding supplement, ENSURE ENLIVE, (ENSURE ENLIVE) LIQD Take 237 mLs by mouth 2 (two) times daily between meals. 540 mL 12  . folic acid (FOLVITE) 1 MG tablet Take 1 tablet (1 mg total) by mouth daily. 30 tablet 0  . furosemide (LASIX) 40 MG tablet Take 1 tablet (40 mg total) by mouth daily. 60 tablet 0  . ibandronate (BONIVA) 150  MG tablet Take 150 mg by mouth every 30 (thirty) days.   3  . LORazepam (ATIVAN) 0.5 MG tablet Take 0.5 mg by mouth every 6 (six) hours as needed for anxiety.    Marland Kitchen MOVANTIK 12.5 MG TABS tablet Take 1 tablet (12.5 mg total) by mouth daily. 10 tablet 0  . Multiple Vitamin (MULTIVITAMIN WITH MINERALS) TABS tablet Take 1 tablet by mouth daily. 30 tablet 0  . naproxen sodium (ALEVE) 220 MG tablet Take 440 mg by mouth 2 (two) times daily as needed (pain).    . polyethylene glycol (MIRALAX / GLYCOLAX) packet Take 17 g by mouth daily as needed for mild constipation.    . potassium chloride SA (K-DUR,KLOR-CON) 20 MEQ tablet Take 1 tablet (20 mEq total) by mouth daily. 10 tablet 0  . sacubitril-valsartan (ENTRESTO) 49-51 MG Take 1 tablet by mouth 2 (two) times daily. 60 tablet 0  . thiamine 100 MG tablet Take 1 tablet (100 mg total) by mouth daily.  30 tablet 0  . traZODone (DESYREL) 50 MG tablet Take 1 tablet (50 mg total) by mouth at bedtime. 15 tablet 0  . XTAMPZA ER 18 MG C12A Take 1 tablet by mouth 2 (two) times daily. 10 each 0  . metoprolol succinate (TOPROL-XL) 25 MG 24 hr tablet Take 1 tablet (25 mg total) by mouth daily. Take with or immediately following a meal. 30 tablet 6   No current facility-administered medications for this visit.     Allergies:   Patient has no known allergies.    Social History:  The patient  reports that she quit smoking about 40 years ago. She has never used smokeless tobacco. She reports current alcohol use of about 9.0 standard drinks of alcohol per week. She reports that she does not use drugs.   Family History:  The patient's family history includes Asthma in her maternal grandmother; Coronary artery disease in her mother; Drug abuse in her brother; Heart Problems in her father.    ROS: All other systems are reviewed and negative. Unless otherwise mentioned in H&P    PHYSICAL EXAM: VS:  BP 138/78   Pulse 95   Ht 5\' 3"  (1.6 m)   Wt 112 lb 8 oz (51 kg)   SpO2 95%   BMI 19.93 kg/m  , BMI Body mass index is 19.93 kg/m. GEN: Well nourished, well developed, in no acute distress HEENT: normal Neck: no JVD, carotid bruits, or masses Cardiac: RRR; tachycardic no murmurs, rubs, or gallops,no edema  Respiratory:  Clear to auscultation bilaterally, normal work of breathing, wearing 02 at 2 liters. Mild crackles in the left base.  GI: soft, nontender, nondistended, + BS MS: no deformity or atrophy Skin: warm and dry, no rash Neuro:  Strength and sensation are intact Psych: euthymic mood, full affect   EKG:  NSR rate of 95 bpm at rest. Evidence of anterior infarct.   Recent Labs: 07/01/2018: ALT 30; B Natriuretic Peptide 862.1 07/04/2018: Magnesium 1.7 07/05/2018: Hemoglobin 12.8; Platelets 480 07/27/2018: BUN 21; Creatinine, Ser 1.41; Potassium 5.3; Sodium 139    Lipid Panel      Component Value Date/Time   CHOL 191 07/29/2011 0841   TRIG 100.0 07/29/2011 0841   HDL 80.00 07/29/2011 0841   CHOLHDL 2 07/29/2011 0841   VLDL 20.0 07/29/2011 0841   LDLCALC 91 07/29/2011 0841      Wt Readings from Last 3 Encounters:  08/31/18 112 lb 8 oz (51 kg)  07/27/18 113 lb 9.6 oz (  51.5 kg)  07/05/18 121 lb 0.5 oz (54.9 kg)      Other studies Reviewed: Echo 07/01/2018 Left ventricle: The cavity size was normal. Wall thickness was   increased in a pattern of mild LVH. Systolic function was   moderately to severely reduced. The estimated ejection fraction   was in the range of 30% to 35%. There is akinesis of the   mid-apicalanteroseptal, inferior, and apical myocardium. Doppler   parameters are consistent with abnormal left ventricular   relaxation (grade 1 diastolic dysfunction). - Aortic valve: There was moderate regurgitation. - Mitral valve: There was mild regurgitation. - Pulmonary arteries: Systolic pressure was moderately increased.   PA peak pressure: 54 mm Hg (S).  Impressions:  - Akinesis of the distal anterior, inferior and apical walls with   overall moderate to severe LV dysfunction; consider takotsubo   cardiomyopathy; mild diastolic dysfunction; moderate AI; mild MR;   mild TR; moderate pulmonary hypertension.  ASSESSMENT AND PLAN:  1. HFrEF: Most recent echo reveals EF of 30-35%. She is tachycardic at rest and not on rate control medications at this time. Rapid HR and increased dyspnea may be related to HR elevation at rest. I will begin metoprolol XL 25 mg daily. If wheezing, may change to bisoprolol.  She will return in one week for follow up of HR and symptoms. Continue Entresto and lasix.   2. O2 Dependent Emphysema: She is being referred to pulmonology and will have a follow up CXR for assessment of unilateral crackles in the left base.    Current medicines are reviewed at length with the patient today.    Labs/ tests ordered today  include: BMET  Phill Myron. [place] Pugh, ANP, AACC   08/31/2018 1:25 PM    [facility] [address] Office [phone] Fax [phone]

## 2018-08-31 ENCOUNTER — Encounter: Payer: Self-pay | Admitting: Adult Health

## 2018-08-31 ENCOUNTER — Ambulatory Visit (INDEPENDENT_AMBULATORY_CARE_PROVIDER_SITE_OTHER): Payer: Commercial Managed Care - PPO | Admitting: Adult Health

## 2018-08-31 ENCOUNTER — Ambulatory Visit
Admission: RE | Admit: 2018-08-31 | Discharge: 2018-08-31 | Disposition: A | Discharge status: Home / Self Care | Payer: Commercial Managed Care - PPO | Source: Ambulatory Visit | Attending: Adult Health | Admitting: Adult Health

## 2018-08-31 ENCOUNTER — Other Ambulatory Visit: Payer: Self-pay | Admitting: Internal Medicine

## 2018-08-31 VITALS — BP 138/78 | HR 95 | Ht 63.0 in | Wt 112.5 lb

## 2018-08-31 DIAGNOSIS — J869 Pyothorax without fistula: Secondary | ICD-10-CM | POA: Diagnosis not present

## 2018-08-31 DIAGNOSIS — R0602 Shortness of breath: Secondary | ICD-10-CM

## 2018-08-31 DIAGNOSIS — R062 Wheezing: Secondary | ICD-10-CM | POA: Diagnosis not present

## 2018-08-31 DIAGNOSIS — I509 Heart failure, unspecified: Secondary | ICD-10-CM | POA: Diagnosis not present

## 2018-08-31 DIAGNOSIS — R4182 Altered mental status, unspecified: Secondary | ICD-10-CM

## 2018-08-31 MED ORDER — METOPROLOL SUCCINATE ER 25 MG PO TB24: 25.0000 mg | ORAL_TABLET | Freq: Every day | ORAL | 6 refills | Status: AC

## 2018-08-31 NOTE — Patient Instructions (Addendum)
Medication Instructions:  START METOPROLOL XL 25MG  DAILY If you need a refill on your cardiac medications before your next appointment, please call your pharmacy.  Labwork: NONE ORDERED  Take the provided lab slips with you to the lab for your blood draw.  When you have your labs (blood work) drawn today and your tests are completely normal, you will receive your results only by MyChart Message (if you have MyChart) -OR-  A paper copy in the mail.  If you have any lab test that is abnormal or we need to change your treatment, we will call you to review these results.  Testing/Procedures: Chest xray - Your physician has requested that you have a chest xray, is a fast and painless imaging test that uses certain electromagnetic waves to create pictures of the structures in and around your chest. This test can help diagnose and monitor conditions such as pneumonia and other lung issues his will be done at [facility], [place]. If you should need to call them their phone number is [phone].  Special Instructions: REFERRAL TO [facility]  Follow-Up: You will need a follow up appointment in 1 weeks.  Please call our office 2 months in advance to schedule this appointment.  You may see Pixie Casino, MD Jory Sims, DNP, AACC  or one of the following Advanced Practice Providers on your designated Care Team:  Almyra Deforest, [place]   Fabian Sharp, PA-C  At [facility], you and your health needs are our priority.  As part of our continuing mission to provide you with exceptional heart care, we have created designated Provider Care Teams.  These Care Teams include your primary Cardiologist (physician) and Advanced Practice Providers (APPs -  Physician Assistants and Nurse Practitioners) who all work together to provide you with the care you need, when you need it.  Thank you for choosing [facility] at [facility]!!

## 2018-09-01 ENCOUNTER — Telehealth: Payer: Self-pay | Admitting: Adult Health

## 2018-09-01 NOTE — Telephone Encounter (Signed)
Follow Up:      Returning your call from today, concerning pt's lab results.

## 2018-09-02 ENCOUNTER — Other Ambulatory Visit: Payer: Self-pay | Admitting: Internal Medicine

## 2018-09-02 ENCOUNTER — Ambulatory Visit
Admission: RE | Admit: 2018-09-02 | Discharge: 2018-09-02 | Disposition: A | Discharge status: Home / Self Care | Payer: Commercial Managed Care - PPO | Source: Ambulatory Visit | Attending: Internal Medicine | Admitting: Internal Medicine

## 2018-09-02 DIAGNOSIS — R4182 Altered mental status, unspecified: Secondary | ICD-10-CM

## 2018-09-02 DIAGNOSIS — R9389 Abnormal findings on diagnostic imaging of other specified body structures: Secondary | ICD-10-CM

## 2018-09-07 ENCOUNTER — Emergency Department (HOSPITAL_COMMUNITY): Payer: Commercial Managed Care - PPO

## 2018-09-07 ENCOUNTER — Inpatient Hospital Stay (HOSPITAL_COMMUNITY)
Admission: EM | Admit: 2018-09-07 | Discharge: 2018-09-12 | DRG: 481 | Disposition: A | Discharge status: Skilled Nursing Facility | Payer: Commercial Managed Care - PPO | Attending: Internal Medicine | Admitting: Internal Medicine

## 2018-09-07 ENCOUNTER — Encounter (HOSPITAL_COMMUNITY): Payer: Self-pay

## 2018-09-07 DIAGNOSIS — I5032 Chronic diastolic (congestive) heart failure: Secondary | ICD-10-CM | POA: Diagnosis not present

## 2018-09-07 DIAGNOSIS — G8929 Other chronic pain: Secondary | ICD-10-CM | POA: Diagnosis present

## 2018-09-07 DIAGNOSIS — Y92009 Unspecified place in unspecified non-institutional (private) residence as the place of occurrence of the external cause: Secondary | ICD-10-CM

## 2018-09-07 DIAGNOSIS — Z825 Family history of asthma and other chronic lower respiratory diseases: Secondary | ICD-10-CM | POA: Diagnosis not present

## 2018-09-07 DIAGNOSIS — F419 Anxiety disorder, unspecified: Secondary | ICD-10-CM | POA: Diagnosis present

## 2018-09-07 DIAGNOSIS — K5909 Other constipation: Secondary | ICD-10-CM | POA: Diagnosis present

## 2018-09-07 DIAGNOSIS — Z79899 Other long term (current) drug therapy: Secondary | ICD-10-CM

## 2018-09-07 DIAGNOSIS — Z9013 Acquired absence of bilateral breasts and nipples: Secondary | ICD-10-CM | POA: Diagnosis not present

## 2018-09-07 DIAGNOSIS — S7222XA Displaced subtrochanteric fracture of left femur, initial encounter for closed fracture: Secondary | ICD-10-CM | POA: Diagnosis not present

## 2018-09-07 DIAGNOSIS — I351 Nonrheumatic aortic (valve) insufficiency: Secondary | ICD-10-CM | POA: Diagnosis not present

## 2018-09-07 DIAGNOSIS — D62 Acute posthemorrhagic anemia: Secondary | ICD-10-CM | POA: Diagnosis not present

## 2018-09-07 DIAGNOSIS — I080 Rheumatic disorders of both mitral and aortic valves: Secondary | ICD-10-CM | POA: Diagnosis present

## 2018-09-07 DIAGNOSIS — I272 Pulmonary hypertension, unspecified: Secondary | ICD-10-CM | POA: Diagnosis present

## 2018-09-07 DIAGNOSIS — Z853 Personal history of malignant neoplasm of breast: Secondary | ICD-10-CM | POA: Diagnosis not present

## 2018-09-07 DIAGNOSIS — Z87891 Personal history of nicotine dependence: Secondary | ICD-10-CM | POA: Diagnosis not present

## 2018-09-07 DIAGNOSIS — J439 Emphysema, unspecified: Secondary | ICD-10-CM | POA: Diagnosis not present

## 2018-09-07 DIAGNOSIS — W01198A Fall on same level from slipping, tripping and stumbling with subsequent striking against other object, initial encounter: Secondary | ICD-10-CM | POA: Diagnosis present

## 2018-09-07 DIAGNOSIS — K219 Gastro-esophageal reflux disease without esophagitis: Secondary | ICD-10-CM | POA: Diagnosis not present

## 2018-09-07 DIAGNOSIS — F329 Major depressive disorder, single episode, unspecified: Secondary | ICD-10-CM | POA: Diagnosis present

## 2018-09-07 DIAGNOSIS — Z419 Encounter for procedure for purposes other than remedying health state, unspecified: Secondary | ICD-10-CM

## 2018-09-07 DIAGNOSIS — Z923 Personal history of irradiation: Secondary | ICD-10-CM

## 2018-09-07 DIAGNOSIS — Z01818 Encounter for other preprocedural examination: Secondary | ICD-10-CM | POA: Diagnosis not present

## 2018-09-07 DIAGNOSIS — S0990XA Unspecified injury of head, initial encounter: Secondary | ICD-10-CM

## 2018-09-07 DIAGNOSIS — Z8249 Family history of ischemic heart disease and other diseases of the circulatory system: Secondary | ICD-10-CM | POA: Diagnosis not present

## 2018-09-07 DIAGNOSIS — S72002A Fracture of unspecified part of neck of left femur, initial encounter for closed fracture: Secondary | ICD-10-CM | POA: Diagnosis present

## 2018-09-07 DIAGNOSIS — F0390 Unspecified dementia without behavioral disturbance: Secondary | ICD-10-CM

## 2018-09-07 DIAGNOSIS — Z8673 Personal history of transient ischemic attack (TIA), and cerebral infarction without residual deficits: Secondary | ICD-10-CM

## 2018-09-07 DIAGNOSIS — S72142A Displaced intertrochanteric fracture of left femur, initial encounter for closed fracture: Secondary | ICD-10-CM | POA: Diagnosis not present

## 2018-09-07 DIAGNOSIS — Z993 Dependence on wheelchair: Secondary | ICD-10-CM | POA: Diagnosis not present

## 2018-09-07 DIAGNOSIS — I5181 Takotsubo syndrome: Secondary | ICD-10-CM | POA: Diagnosis present

## 2018-09-07 LAB — TROPONIN I: Troponin I: <0.03 ng/mL (ref <0.03)

## 2018-09-07 LAB — CBC WITH DIFFERENTIAL/PLATELET
ABS IMMATURE GRANULOCYTES: 0.06 10*3/uL (ref 0.00–0.07)
BASOS ABS: 0 10*3/uL (ref 0.0–0.1)
Basophils Relative: 0 %
EOS ABS: 0.3 10*3/uL (ref 0.0–0.5)
Eosinophils Relative: 3 %
HEMATOCRIT: 36.2 % (ref 36.0–46.0)
HEMOGLOBIN: 11.2 g/dL — AB (ref 12.0–15.0)
IMMATURE GRANULOCYTES: 1 %
LYMPHS ABS: 1.9 10*3/uL (ref 0.7–4.0)
LYMPHS PCT: 16 %
MCH: 30.8 pg (ref 26.0–34.0)
MCHC: 30.9 g/dL (ref 30.0–36.0)
MCV: 99.5 fL (ref 80.0–100.0)
MONOS PCT: 7 %
Monocytes Absolute: 0.9 10*3/uL (ref 0.1–1.0)
NEUTROS PCT: 73 %
NRBC: 0 % (ref 0.0–0.2)
Neutro Abs: 8.4 10*3/uL — ABNORMAL HIGH (ref 1.7–7.7)
Platelets: 369 10*3/uL (ref 150–400)
RBC: 3.64 MIL/uL — ABNORMAL LOW (ref 3.87–5.11)
RDW: 13.2 % (ref 11.5–15.5)
WBC: 11.6 10*3/uL — ABNORMAL HIGH (ref 4.0–10.5)

## 2018-09-07 LAB — BASIC METABOLIC PANEL
ANION GAP: 7 (ref 5–15)
BUN: 17 mg/dL (ref 8–23)
CALCIUM: 9.7 mg/dL (ref 8.9–10.3)
CHLORIDE: 99 mmol/L (ref 98–111)
CO2: 30 mmol/L (ref 22–32)
Creatinine, Ser: 0.8 mg/dL (ref 0.44–1.00)
GFR calc non Af Amer: >60 mL/min (ref >60)
GLUCOSE: 116 mg/dL — AB (ref 70–99)
Potassium: 4.2 mmol/L (ref 3.5–5.1)
Sodium: 136 mmol/L (ref 135–145)

## 2018-09-07 LAB — PROTIME-INR
INR: 0.91
Prothrombin Time: 12.2 seconds (ref 11.4–15.2)

## 2018-09-07 MED ORDER — HYDROMORPHONE HCL 1 MG/ML IJ SOLN
0.5000 mg | INTRAMUSCULAR | Status: DC | PRN
  Administered 2018-09-07 – 2018-09-11 (×9): 0.5 mg via INTRAVENOUS
  Filled 2018-09-07 (×9): qty 0.5

## 2018-09-07 MED ORDER — MORPHINE SULFATE (PF) 4 MG/ML IV SOLN
4.0000 mg | Freq: Once | INTRAVENOUS | Status: AC
  Administered 2018-09-07: 4 mg via INTRAVENOUS
  Filled 2018-09-07: qty 1

## 2018-09-07 MED ORDER — HYDROMORPHONE HCL 1 MG/ML IJ SOLN: 1.0000 mg | Freq: Once | INTRAMUSCULAR | Status: DC

## 2018-09-07 NOTE — ED Notes (Signed)
Bed: IR67 Expected date:  Expected time:  Means of arrival:  Comments: 85 yo F/ fall poss hip fx

## 2018-09-07 NOTE — ED Notes (Signed)
Spoke to Dr. Roel Cluck, given 4mg  of morphine rather than 1mg  of Dilaudid.

## 2018-09-07 NOTE — H&P (Signed)
Yolanda [name] [id number]:[id number] DOB: June 14, 1934 DOA: 09/07/2018     PCP: Javier Glazier, MD   Outpatient Specialists:  CARDS:   [facility]   Patient arrived to ER on 09/07/18 at 2032  Patient coming from:   From facility  [place] [facility] [place]   Chief Complaint:  Chief Complaint  Patient presents with  . Hip Injury    HPI: Yolanda [name] is a 85 y.o. 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 First day back after prolonged stay at [facility] after admit for CHF.  States   states she was reaching out for an object on a high shelf and lost her balance.  unable to stand up thereafter hitting the back of her head on the cabinet behind her. no LOC left hip of outward rotation unable to straighten leg.  When EMS picked up the patient crack was heard patient was placed in pelvic binder. 3 L  nasal cannula placed satting 96% Last admission was from 11th to 17 of November secondary to initial sepsis chest x-ray showing bilateral small effusions with patchy opacities could not rule out early infiltrate CT of the chest showed multifocal pneumonia 2D echo done showed EF of 30-35% and akinesis CHF exacerbation,  At baseline on 2 L of nasal cannula  She recognizes family , not oriented to day and situation Ambulated with walker/ wheelchair.  Regarding pertinent Chronic problems:   Regarding CHF recently see by Cardiology: felt to be  tachycardic at rest and not on rate control medications at this time. Rapid HR and increased dyspnea may be related to HR elevation at rest.  Cardiology  began metoprolol XL 25 mg daily Continued Entresto and lasix    Echo 07/01/2018 Left ventricle:  The cavity size was normal. Wall thickness was increased in a pattern of mild LVH. Systolic function was moderately to severely reduced. The estimated ejection fraction  Chronic dyspnea with history of emphysema referred to pulmonology  was in the range of 30% to 35%. There is akinesis of the mid-apicalanteroseptal, inferior, and apical myocardium. Doppler parameters are consistent with abnormal left ventricular relaxation (grade 1 diastolic dysfunction). - Aortic valve: There was moderate regurgitation. - Mitral valve: There was mild regurgitation. - Pulmonary arteries: Systolic pressure was moderately increased. PA peak pressure: 54 mm Hg (S).   While in ER:  The following Work up has been ordered so far:  Orders Placed This Encounter  Procedures  . CT Head Wo Contrast  . DG Hip Unilat W or Wo Pelvis 2-3 Views Left  . DG Femur Min 2 Views Left  . DG Chest Portable 1 View  . Basic metabolic panel  . CBC with Differential  . Protime-INR  . Consult to orthopedic surgery  . Type and screen  . Saline lock IV     Following Medications were ordered in ER: Medications  morphine 4 MG/ML injection 4 mg (4 mg Intravenous Given 09/07/18 2110)    Significant initial  Findings: Abnormal Labs Reviewed  CBC WITH DIFFERENTIAL/PLATELET - Abnormal; Notable for the following components:      Result Value   WBC 11.6 (*)    RBC 3.64 (*)    Hemoglobin 11.2 (*)  Neutro Abs 8.4 (*)    All other components within normal limits     Lactic Acid, Venous    Component Value Date/Time   LATICACIDVEN 1.60 06/29/2018 1545    Na 136 K 4.2  Cr   Stable  Lab Results  Component Value Date   CREATININE 1.41 (H) 07/27/2018   CREATININE 0.74 07/05/2018   CREATININE 0.73 07/04/2018      WBC  11.6  HG/HCT  stable,      Component Value Date/Time   HGB 11.2 (L) 09/07/2018 2111   HCT 36.2 09/07/2018 2111     Troponin (Point of Care Test) No results for input(s):  TROPIPOC in the last 72 hours.   BNP (last 3 results) Recent Labs    07/01/18 1058  BNP 862.1*    ProBNP (last 3 results) No results for input(s): PROBNP in the last 8760 hours.    UA  not ordered   CT HEAD   NON acute  CXR -  NON acute LEft hip - Acute comminuted mildly displaced fracture of the intertrochanteric left hip with subtrochanteric extension    ECG: ordered     ED Triage Vitals  Enc Vitals Group     BP 09/07/18 2100 (!) 144/72     Pulse Rate 09/07/18 2100 75     Resp 09/07/18 2100 17     Temp 09/07/18 2049 98.1 F (36.7 C)     Temp Source 09/07/18 2049 Oral     SpO2 09/07/18 2100 96 %     Weight --      Height 09/07/18 2049 5\' 3"  (1.6 m)     Head Circumference --      Peak Flow --      Pain Score --      Pain Loc --      Pain Edu? --      Excl. in [place]? --   TMAX(24)@       Latest  Blood pressure (!) 144/72, pulse 75, temperature 98.1 F (36.7 C), temperature source Oral, resp. rate 17, height 5\' 3"  (1.6 m), SpO2 96 %.   ER Provider Called: orthopedics  Dr. Wynelle Link They Recommend admit to medicine to [facility], make NPO post midnight  Will see in AM   Hospitalist was called for admission for left hip fracture    Review of Systems:    Pertinent positives include:  fatigue  Constitutional:  No weight loss, night sweats, Fevers, chills,, weight loss  HEENT:  No headaches, Difficulty swallowing,Tooth/dental problems,Sore throat,  No sneezing, itching, ear ache, nasal congestion, post nasal drip,  Cardio-vascular:  No chest pain, Orthopnea, PND, anasarca, dizziness, palpitations.no Bilateral lower extremity swelling  GI:  No heartburn, indigestion, abdominal pain, nausea, vomiting, diarrhea, change in bowel habits, loss of appetite, melena, blood in stool, hematemesis Resp:  no shortness of breath at rest. No dyspnea on exertion, No excess mucus, no productive cough, No non-productive cough, No coughing up of blood.No change in color of mucus.No  wheezing. Skin:  no rash or lesions. No jaundice GU:  no dysuria, change in color of urine, no urgency or frequency. No straining to urinate.  No flank pain.  Musculoskeletal:  No joint pain or no joint swelling. No decreased range of motion. No back pain.  Psych:  No change in mood or affect. No depression or anxiety. No memory loss.  Neuro: no localizing neurological complaints, no tingling, no weakness, no double vision, no gait abnormality, no slurred speech, no confusion  All systems reviewed and apart from [place] all are negative  Past Medical History:   Past Medical History:  Diagnosis Date  . Adenomatous colon polyp    tubular  . Anxiety disorder    panic attacks, PMH of. Dr  Janna Arch  . 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 Diverticulosis   . Empyema ([place])    Left   . Fibroids    Lupron shots  . GERD (gastroesophageal reflux disease)   . High altitude sickness 2008  . Premature menopause    due to Lupron      Past Surgical History:  Procedure Laterality Date  . BREAST LUMPECTOMY  1984   radiation , R breast  . CESAREAN SECTION     X 2  . colonoscopy with polypectomy  2011   Dr  Delfin Edis  . LAMINECTOMY     X3 L-Sspine  . MASTECTOMY  1990   R breast  . MASTECTOMY  1996   L    Social History:  Ambulatory  walker  wheelchair bound,      reports that she quit smoking about 40 years ago. She has never used smokeless tobacco. She reports current alcohol use of about 9.0 standard drinks of alcohol per week. She reports that she does not use drugs.     Family History:   Family History  Problem Relation Age of Onset  . Asthma Maternal Grandmother   . Coronary artery disease Mother        dysrrhythmia  . Drug abuse Brother   . Heart Problems Father   . Cancer Neg Hx     Allergies: No Known Allergies   Prior to Admission medications   Medication Sig Start Date End Date Taking? Authorizing Provider    acetaminophen (TYLENOL) 500 MG tablet Take 1,000 mg by mouth every 8 (eight) hours as needed.    [provider]  Cholecalciferol (VITAMIN D3) 1.25 MG (50000 UT) CAPS Take 1 capsule by mouth once a week.  05/07/18   [provider]  cyclobenzaprine (FLEXERIL) 5 MG tablet Take 5 mg by mouth 3 (three) times daily as needed for muscle spasms.  03/30/18   [provider]  docusate (COLACE) 50 MG/5ML liquid Take 10 mLs (100 mg total) by mouth 2 (two) times daily as needed for mild constipation. 02/20/17   Robbie Lis, MD  donepezil (ARICEPT) 10 MG tablet Take 10 mg by mouth at bedtime.  06/19/18   [provider]  DULoxetine (CYMBALTA) 60 MG capsule Take 1 capsule by mouth daily. 07/13/18   [provider]  famotidine (PEPCID) 20 MG tablet Take 1 tablet (20 mg total) by mouth 2 (two) times daily. 02/20/17   Robbie Lis, MD  feeding supplement, ENSURE ENLIVE, (ENSURE ENLIVE) LIQD Take 237 mLs by mouth 2 (two) times daily between meals. 02/20/17   Robbie Lis, MD  folic acid (FOLVITE) 1 MG tablet Take 1 tablet (1 mg total) by mouth daily. 02/20/17   Robbie Lis, MD  furosemide (LASIX) 40 MG tablet Take 1 tablet (40 mg total) by mouth daily. 07/27/18   Barrett, Evelene Croon, PA-C  ibandronate (BONIVA) 150 MG tablet Take 150 mg by mouth every 30 (thirty) days.  06/13/18   [provider]  LORazepam (ATIVAN) 0.5 MG tablet Take 0.5 mg by mouth every 6 (six) hours as needed for anxiety.    [provider]  metoprolol succinate (TOPROL-XL)  25 MG 24 hr tablet Take 1 tablet (25 mg total) by mouth daily. Take with or immediately following a meal. 08/31/18   Lendon Colonel, NP  MOVANTIK 12.5 MG TABS tablet Take 1 tablet (12.5 mg total) by mouth daily. 07/05/18   Dhungel, Flonnie Overman, MD  Multiple Vitamin (MULTIVITAMIN WITH MINERALS) TABS tablet Take 1 tablet by mouth daily. 02/20/17   Robbie Lis, MD  naproxen sodium (ALEVE) 220 MG tablet Take 440 mg by  mouth 2 (two) times daily as needed (pain).    [provider]  polyethylene glycol (MIRALAX / GLYCOLAX) packet Take 17 g by mouth daily as needed for mild constipation.    [provider]  potassium chloride SA (K-DUR,KLOR-CON) 20 MEQ tablet Take 1 tablet (20 mEq total) by mouth daily. 07/06/18   Dhungel, Nishant, MD  sacubitril-valsartan (ENTRESTO) 49-51 MG Take 1 tablet by mouth 2 (two) times daily. 07/05/18   Dhungel, Flonnie Overman, MD  thiamine 100 MG tablet Take 1 tablet (100 mg total) by mouth daily. 02/20/17   Robbie Lis, MD  traZODone (DESYREL) 50 MG tablet Take 1 tablet (50 mg total) by mouth at bedtime. 02/20/17   Robbie Lis, MD  XTAMPZA ER 18 MG C12A Take 1 tablet by mouth 2 (two) times daily. 07/05/18   Dhungel, Flonnie Overman, MD   Physical Exam: Blood pressure (!) 144/72, pulse 75, temperature 98.1 F (36.7 C), temperature source Oral, resp. rate 17, height 5\' 3"  (1.6 m), SpO2 96 %. 1. General:  in No Acute distress  Chronically ill  -appearing 2. Psychological: Alert and Oriented to self  3. Head/ENT:   Moist   Mucous Membranes                          Head Non traumatic, neck supple                          Poor Dentition 4. SKIN:    decreased Skin turgor,  Skin clean Dry and intact no rash 5. Heart: Regular rate and rhythm no  Murmur, no Rub or gallop 6. Lungs: no wheezes or crackles   7. Abdomen: Soft, non-tender, Non distended  bowel sounds present 8. Lower extremities: no clubbing, cyanosis, or  edema 9. Neurologically Grossly intact, moving all 4 extremities equally difficult to assess lower extremities given significant pain but appears to be unchanged from baseline limb 10. MSK: Normal range of motion limited in left lower extremity given severe pain   LABS:     Recent Labs  Lab 09/07/18 2111  WBC 11.6*  NEUTROABS 8.4*  HGB 11.2*  HCT 36.2  MCV 99.5  PLT 998   Basic Metabolic Panel: No results for input(s): NA, K, CL, CO2, GLUCOSE, BUN,  CREATININE, CALCIUM, MG, PHOS in the last 168 hours.    No results for input(s): AST, ALT, ALKPHOS, BILITOT, PROT, ALBUMIN in the last 168 hours. No results for input(s): LIPASE, AMYLASE in the last 168 hours. No results for input(s): AMMONIA in the last 168 hours.    HbA1C: No results for input(s): HGBA1C in the last 72 hours. CBG: No results for input(s): GLUCAP in the last 168 hours.    Urine analysis:    Component Value Date/Time   COLORURINE AMBER (A) 06/29/2018 [place] 06/29/2018 1555   LABSPEC 1.026 06/29/2018 1555   PHURINE 5.0 06/29/2018 [place] 06/29/2018 1555  HGBUR NEGATIVE 06/29/2018 [place] 06/29/2018 1555   BILIRUBINUR neg 01/23/2012 1445   KETONESUR 20 (A) 06/29/2018 1555   PROTEINUR 30 (A) 06/29/2018 1555   UROBILINOGEN 0.2 01/23/2012 1445   NITRITE NEGATIVE 06/29/2018 1555   LEUKOCYTESUR NEGATIVE 06/29/2018 1555       Cultures:    Component Value Date/Time   SDES  06/29/2018 1555    BLOOD RIGHT FOREARM Performed at [facility], [place] [address], [place], [place] [address]    SPECREQUEST  06/29/2018 1555    BOTTLES DRAWN AEROBIC ONLY Blood Culture adequate volume Performed at [place] [address], [place], [place] [address]    CULT  06/29/2018 1555    NO GROWTH 5 DAYS Performed at [place] [address] [place],  [address]    REPTSTATUS 07/04/2018 FINAL 06/29/2018 1555     Radiological Exams on Admission: Ct Head Wo Contrast  Result Date: 09/07/2018 CLINICAL DATA:  Unwitnessed fall EXAM: CT HEAD WITHOUT CONTRAST TECHNIQUE: Contiguous axial images were obtained from the base of the skull through the vertex without intravenous contrast. COMPARISON:  CT brain 09/02/2018 FINDINGS: Brain: No acute territorial infarction, hemorrhage, or intracranial mass. Mild motion degradation. Chronic infarcts in the right thalamus, right  occipital lobe, and right cerebellum. Moderate atrophy. Mild small vessel ischemic changes of the white matter. Stable ventricle size. Vascular: No hyperdense vessels.  Carotid vascular calcification Skull: Fracture Sinuses/Orbits: No acute finding. Other: None IMPRESSION: 1. Mild motion degraded study 2. No definite CT evidence for acute intracranial abnormality 3. Atrophy and mild small vessel ischemic changes of the white matter. Old infarcts in the right thalamus, occipital lobe and right cerebellum. Electronically Signed   By: Donavan Foil M.D.   On: 09/07/2018 21:44   Dg Chest Portable 1 View  Result Date: 09/07/2018 CLINICAL DATA:  Preop, femur fracture EXAM: PORTABLE CHEST 1 VIEW COMPARISON:  09/07/2017, 08/31/2018 FINDINGS: Thoracic ascending stimulator leads. No acute airspace disease. Stable cardiomediastinal silhouette. Aortic atherosclerosis. No pneumothorax. Surgical clips in the axillary regions. IMPRESSION: No active disease. Electronically Signed   By: Donavan Foil M.D.   On: 09/07/2018 21:52   Dg Hip Unilat W Or Wo Pelvis 2-3 Views Left  Result Date: 09/07/2018 CLINICAL DATA:  Initial evaluation for acute trauma, fall. EXAM: DG HIP (WITH OR WITHOUT PELVIS) 2-3V LEFT COMPARISON:  None. FINDINGS: Acute comminuted fracture of the intertrochanteric left femur with subtrochanteric extension with displacement. Femoral head remains normally position within the acetabulum. Bony pelvis intact. Limited views of the right hip demonstrate no acute finding. Osteopenia noted. Underlying osteoarthritic changes noted about the hips. Probable calcified fibroid overlies the mid pelvis. IMPRESSION: Acute comminuted mildly displaced fracture of the intertrochanteric left hip with subtrochanteric extension. Electronically Signed   By: Jeannine Boga M.D.   On: 09/07/2018 21:39   Dg Femur Min 2 Views Left  Result Date: 09/07/2018 CLINICAL DATA:  Initial evaluation for acute trauma, fall. EXAM: LEFT  FEMUR 2 VIEWS COMPARISON:  None. FINDINGS: No acute fracture about the visualized mid and distal left femur. Visualized proximal tibia and fibula intact. Osteoarthritic changes present about the knee. Diffuse osteopenia. No soft tissue abnormality. Vascular calcifications noted within the left leg. IMPRESSION: No other acute osseous abnormality about the mid-distal left femur. Electronically Signed   By: Jeannine Boga M.D.   On: 09/07/2018 21:41    Chart has been reviewed    Assessment/Plan  85 y.o. 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 Closed left hip fracture ([place]) -  - management as per orthopedics,  plan to operate on Wednesday if able   Keep nothing by mouth post midnight. Patient   not on anticoagulation or antiplatelet agents   with aspirin Ordered type and screen, Place Foley, order a vitamin D level  Patient at baseline  unable to walk a flight of stairs or 100 feet   due to  generalize fatigue and deconditioning    Patient denies any chest pain or shortness of breath currently but has dementia and unable to provide detailed history   Order ECG   no known history of coronary artery disease, but known history of recent admission for CHF with Takotsubo cardiomyopathy EF of 30 to 35% Given advanced age patient is at least moderate  Risk  which has been discussed with family    will order echo given recent history of Takotsubo cardiomyopathy is possible that cardiac function has improved since then   Cardiology consult for further pre-op clearance given extensive cardiac disease    . GERD -  we will continue home medications . Dementia without behavioral disturbance ([place]) stable continue home medications . Takotsubo  cardiomyopathy -continue home medications appreciate cardiology input regarding management For Now continue beta-blocker, Entresto and spironolactone Patient unlikely to go to the OR tomorrow  Patient history of chronic pain continue home medications to avoid withdrawal and treat acute pain as needed Other plan as per orders.  DVT prophylaxis:  SCD    Code Status:  FULL CODE   as per family  I had personally discussed CODE STATUS with patient and family  Family Communication:   Family  at  Bedside  plan of care was discussed with   Son   Disposition Plan:     likely will need placement for rehabilitation                                             Would benefit from PT/OT eval prior to DC                      Swallow eval - SLP ordered                   Social Work  consulted                                       Consults called: Dr. Wynelle Link with orthopedics, email cardiology   Admission status:   inpatient     Expect 2 midnight stay secondary to severity of patient's current illness including     Severe lab/radiological abnormalities including:   Left hip fracture and extensive comorbidities including:  Chronic pain  CHF dementia history of stroke   That are currently affecting medical management.   I expect  patient to be hospitalized for 2 midnights requiring inpatient medical care.  Patient is at high risk for adverse outcome (such as loss of life or disability) if not treated.  Indication for inpatient stay  as follows:    severe pain requiring acute inpatient management,      Need for operative/procedural  intervention   Need for   IV fluids, , IV pain medications    Level of care    tele  For  24H           Cloud Graham 09/08/2018, 1:11 AM   [facility]     after 2 AM please page floor coverage PA If 7AM-7PM, please contact the day team taking care of the patient using [email or web address]

## 2018-09-07 NOTE — ED Notes (Signed)
ED TO INPATIENT HANDOFF REPORT  Name/Age/Gender Yolanda [name] 85 y.o. female  Code Status Code Status History    Date Active Date Inactive Code Status Order ID Comments User Context   07/02/2018 1207 07/05/2018 1748 Partial Code [id number]  Donita Brooks, NP Inpatient   06/29/2018 1828 07/02/2018 1207 Full Code [id number]  Georgette Shell, MD ED   02/07/2017 1726 02/20/2017 1630 Full Code [id number]  Verlee Monte, MD Inpatient    Questions for Most Recent Historical Code Status (Order [id number])    Question Answer Comment   In the event of cardiac or respiratory ARREST: Initiate Code Blue, Call Rapid Response Yes    In the event of cardiac or respiratory ARREST: Perform CPR No    In the event of cardiac or respiratory ARREST: Perform Intubation/Mechanical Ventilation Yes    In the event of cardiac or respiratory ARREST: Use NIPPV/BiPAp only if indicated Yes    In the event of cardiac or respiratory ARREST: Administer ACLS medications if indicated No    In the event of cardiac or respiratory ARREST: Perform Defibrillation or Cardioversion if indicated No       Home/SNF/Other Skilled nursing facility  Chief Complaint Hip injury  Level of Care/Admitting Diagnosis ED Disposition    ED Disposition Condition [place]: [place] [[id number]]  Level of Care: Telemetry [5]  Admit to tele based on following criteria: Other see comments  Comments: hx of severe CHF  Diagnosis: Closed left hip fracture [facility]) [[id number]]  Admitting Physician: Toy Baker [[id number]]  Attending Physician: Toy Baker [[id number]]  Estimated length of stay: 3 - 4 days  Certification:: I certify this patient will need inpatient services for at least 2 midnights  PT Class (Do Not Modify): Inpatient [101]  PT Acc Code (Do Not Modify): Private [1]       Medical History Past Medical History:  Diagnosis Date  . Adenomatous colon polyp    tubular  .  Anxiety disorder    panic attacks, PMH of. Dr  Janna Arch  . 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 Diverticulosis   . Empyema ([place])    Left   . Fibroids    Lupron shots  . GERD (gastroesophageal reflux disease)   . High altitude sickness 2008  . Premature menopause    due to Lupron    Allergies No Known Allergies  IV Location/Drains/Wounds Patient Lines/Drains/Airways Status   Active Line/Drains/Airways    Name:   Placement date:   Placement time:   Site:   Days:   Peripheral IV 09/07/18 Right Antecubital   09/07/18    2043    Antecubital   less than 1   External Urinary Catheter   06/29/18    1541    -   70          Labs/Imaging Results for orders placed or performed during the hospital encounter of 09/07/18 (from the past 48 hour(s))  Basic metabolic panel     Status: Abnormal   Collection Time: 09/07/18  9:11 PM  Result Value Ref Range   Sodium 136 135 - 145 mmol/L   Potassium 4.2 3.5 - 5.1 mmol/L   Chloride 99 98 - 111 mmol/L   CO2 30 22 - 32 mmol/L   Glucose, Bld 116 (H) 70 - 99 mg/dL   BUN 17 8 - 23 mg/dL   Creatinine, Ser 0.80 0.44 - 1.00 mg/dL  Calcium 9.7 8.9 - 10.3 mg/dL   GFR calc non Af Amer >60 >60 mL/min   GFR calc Af Amer >60 >60 mL/min   Anion gap 7 5 - 15    Comment: Performed at [facility], [place] [address], [place], [place] [address]  CBC with Differential     Status: Abnormal   Collection Time: 09/07/18  9:11 PM  Result Value Ref Range   WBC 11.6 (H) 4.0 - 10.5 K/uL   RBC 3.64 (L) 3.87 - 5.11 MIL/uL   Hemoglobin 11.2 (L) 12.0 - 15.0 g/dL   HCT 36.2 36.0 - 46.0 %   MCV 99.5 80.0 - 100.0 fL   MCH 30.8 26.0 - 34.0 pg   MCHC 30.9 30.0 - 36.0 g/dL   RDW 13.2 11.5 - 15.5 %   Platelets 369 150 - 400 K/uL   nRBC 0.0 0.0 - 0.2 %   Neutrophils Relative % 73 %   Neutro Abs 8.4 (H) 1.7 - 7.7 K/uL   Lymphocytes Relative 16 %   Lymphs Abs 1.9 0.7 - 4.0 K/uL   Monocytes Relative 7 %    Monocytes Absolute 0.9 0.1 - 1.0 K/uL   Eosinophils Relative 3 %   Eosinophils Absolute 0.3 0.0 - 0.5 K/uL   Basophils Relative 0 %   Basophils Absolute 0.0 0.0 - 0.1 K/uL   Immature Granulocytes 1 %   Abs Immature Granulocytes 0.06 0.00 - 0.07 K/uL    Comment: Performed at [facility], [place] [address], [place], [place] [address]  Protime-INR     Status: None   Collection Time: 09/07/18  9:11 PM  Result Value Ref Range   Prothrombin Time 12.2 11.4 - 15.2 seconds   INR 0.91     Comment: Performed at [facility], [place] [address], [place], [place] [address]  Type and screen     Status: None (Preliminary result)   Collection Time: 09/07/18  9:11 PM  Result Value Ref Range   ABO/RH(D) PENDING    Antibody Screen PENDING    Sample Expiration      09/10/2018 Performed at [facility], [place] [address], [place],  [address]   Troponin I - Once     Status: None   Collection Time: 09/07/18  9:11 PM  Result Value Ref Range   Troponin I <0.03 <0.03 ng/mL    Comment: Performed at [facility], [place] [address], [place],  [address]   Ct Head Wo Contrast  Result Date: 09/07/2018 CLINICAL DATA:  Unwitnessed fall EXAM: CT HEAD WITHOUT CONTRAST TECHNIQUE: Contiguous axial images were obtained from the base of the skull through the vertex without intravenous contrast. COMPARISON:  CT brain 09/02/2018 FINDINGS: Brain: No acute territorial infarction, hemorrhage, or intracranial mass. Mild motion degradation. Chronic infarcts in the right thalamus, right occipital lobe, and right cerebellum. Moderate atrophy. Mild small vessel ischemic changes of the white matter. Stable ventricle size. Vascular: No hyperdense vessels.  Carotid vascular calcification Skull: Fracture Sinuses/Orbits: No acute finding. Other: None IMPRESSION: 1. Mild motion degraded study 2. No definite CT evidence for acute intracranial abnormality 3. Atrophy  and mild small vessel ischemic changes of the white matter. Old infarcts in the right thalamus, occipital lobe and right cerebellum. Electronically Signed   By: Donavan Foil M.D.   On: 09/07/2018 21:44   Dg Chest Portable 1 View  Result Date: 09/07/2018 CLINICAL DATA:  Preop, femur fracture EXAM: PORTABLE CHEST 1 VIEW  COMPARISON:  09/07/2017, 08/31/2018 FINDINGS: Thoracic ascending stimulator leads. No acute airspace disease. Stable cardiomediastinal silhouette. Aortic atherosclerosis. No pneumothorax. Surgical clips in the axillary regions. IMPRESSION: No active disease. Electronically Signed   By: Donavan Foil M.D.   On: 09/07/2018 21:52   Dg Hip Unilat W Or Wo Pelvis 2-3 Views Left  Result Date: 09/07/2018 CLINICAL DATA:  Initial evaluation for acute trauma, fall. EXAM: DG HIP (WITH OR WITHOUT PELVIS) 2-3V LEFT COMPARISON:  None. FINDINGS: Acute comminuted fracture of the intertrochanteric left femur with subtrochanteric extension with displacement. Femoral head remains normally position within the acetabulum. Bony pelvis intact. Limited views of the right hip demonstrate no acute finding. Osteopenia noted. Underlying osteoarthritic changes noted about the hips. Probable calcified fibroid overlies the mid pelvis. IMPRESSION: Acute comminuted mildly displaced fracture of the intertrochanteric left hip with subtrochanteric extension. Electronically Signed   By: Jeannine Boga M.D.   On: 09/07/2018 21:39   Dg Femur Min 2 Views Left  Result Date: 09/07/2018 CLINICAL DATA:  Initial evaluation for acute trauma, fall. EXAM: LEFT FEMUR 2 VIEWS COMPARISON:  None. FINDINGS: No acute fracture about the visualized mid and distal left femur. Visualized proximal tibia and fibula intact. Osteoarthritic changes present about the knee. Diffuse osteopenia. No soft tissue abnormality. Vascular calcifications noted within the left leg. IMPRESSION: No other acute osseous abnormality about the mid-distal left  femur. Electronically Signed   By: Jeannine Boga M.D.   On: 09/07/2018 21:41   None  Pending Labs Unresulted Labs (From admission, onward)   None      Vitals/Pain Today's Vitals   09/07/18 2200 09/07/18 2229 09/07/18 2230 09/07/18 2300  BP: 137/68  136/73   Pulse: 75  72 72  Resp: 15  16   Temp:      TempSrc:      SpO2: 96%  97% 97%  Height:      PainSc:  8       Isolation Precautions No active isolations  Medications Medications  morphine 4 MG/ML injection 4 mg (4 mg Intravenous Given 09/07/18 2110)  morphine 4 MG/ML injection 4 mg (4 mg Intravenous Given 09/07/18 2234)    Mobility non-ambulatory

## 2018-09-07 NOTE — ED Provider Notes (Addendum)
[facility] DEPT Provider Note   CSN: [id number] Arrival date & time: 09/07/18  2032     History   Chief Complaint Chief Complaint  Patient presents with  . Hip Injury    HPI Yolanda [name] is a 85 y.o. female.  Patient is an 85 year old female with history of congestive heart failure, GERD, anxiety.  She presents today for evaluation of a fall.  She just returned home to her apartment today after a stay in rehab for CHF.  She was reaching for an object on a high shelf when she lost her balance and fell.  She landed on her buttock, then hit her head on the cabinet.  She denies any loss of consciousness but does complain of pain to the occiput.  She denies any neck pain.  She is complaining of severe pain in her left hip/upper thigh.  She was unable to stand and walk secondary to the pain.  The history is provided by the patient.    Past Medical History:  Diagnosis Date  . Adenomatous colon polyp    tubular  . Anxiety disorder    panic attacks, PMH of. Dr  Janna Arch  . 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 Diverticulosis   . Empyema ([place])    Left   . Fibroids    Lupron shots  . GERD (gastroesophageal reflux disease)   . High altitude sickness 2008  . Premature menopause    due to Lupron    Patient Active Problem List   Diagnosis Date Noted  . Takotsubo cardiomyopathy 07/05/2018  . Acute respiratory failure with hypoxia ([place])   . Pleural effusion   . Acute pulmonary edema (H[facility])   . Dementia without behavioral disturbance ([place]) 06/29/2018  . Hypokalemia 06/29/2018  . HCAP (healthcare-associated pneumonia)   . Sepsis due to pneumonia ([place])   . Empyema lung ([place])   . Pleural effusion on left   . Acute respiratory failure with hypoxemia ([place])   . Hypoxia   . Leukocytosis   . Severe protein-calorie malnutrition ([place])   . Acute bronchitis 02/07/2017  . ETOH abuse 02/07/2017  . Fall  02/07/2017  . Coccyx pain 02/07/2017  . Abnormal EKG 11/24/2015  . Pre-operative cardiovascular examination 11/24/2015  . Non-compliant behavior 06/23/2015  . ARTHRALGIA 05/14/2010  . SIGMOID POLYP 10/10/2009  . CARPAL TUNNEL SYNDROME 05/24/2009  . FASTING HYPERGLYCEMIA 05/24/2009  . ASTHMA 05/19/2008  . GERD 05/19/2008  . ELEVATED BLOOD PRESSURE WITHOUT DIAGNOSIS OF HYPERTENSION 05/19/2008  . TRIGGER FINGER, RIGHT MIDDLE 10/05/2007  . 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 BREAST LUMPECTOMY  1984   radiation , R breast  . CESAREAN SECTION     X 2  . colonoscopy with polypectomy  2011   Dr  Delfin Edis  . LAMINECTOMY     X3 L-Sspine  . MASTECTOMY  1990   R breast  . MASTECTOMY  1996   L     OB History   No obstetric history on file.      Home Medications    Prior to Admission medications   Medication Sig Start Date End Date Taking? Authorizing Provider  acetaminophen (TYLENOL) 500 MG tablet Take 1,000 mg by mouth every 8 (eight) hours as needed.    [provider]  Cholecalciferol (VITAMIN D3) 1.25 MG (50000 UT) CAPS Take 1 capsule by mouth once a week.  05/07/18   [provider]  cyclobenzaprine (FLEXERIL) 5 MG tablet Take 5 mg by mouth 3 (three) times daily as needed for muscle spasms.  03/30/18   [provider]  docusate (COLACE) 50 MG/5ML liquid Take 10 mLs (100 mg total) by mouth 2 (two) times daily as needed for mild constipation. 02/20/17   Robbie Lis, MD  donepezil (ARICEPT) 10 MG tablet Take 10 mg by mouth at bedtime.  06/19/18   [provider]  DULoxetine (CYMBALTA) 60 MG capsule Take 1 capsule by mouth daily. 07/13/18   [provider]  famotidine (PEPCID) 20 MG tablet Take 1 tablet (20 mg total) by mouth 2 (two) times daily. 02/20/17   Robbie Lis, MD  feeding supplement, ENSURE ENLIVE, (ENSURE ENLIVE) LIQD Take 237 mLs by mouth 2 (two) times daily  between meals. 02/20/17   Robbie Lis, MD  folic acid (FOLVITE) 1 MG tablet Take 1 tablet (1 mg total) by mouth daily. 02/20/17   Robbie Lis, MD  furosemide (LASIX) 40 MG tablet Take 1 tablet (40 mg total) by mouth daily. 07/27/18   Barrett, Evelene Croon, PA-C  ibandronate (BONIVA) 150 MG tablet Take 150 mg by mouth every 30 (thirty) days.  06/13/18   [provider]  LORazepam (ATIVAN) 0.5 MG tablet Take 0.5 mg by mouth every 6 (six) hours as needed for anxiety.    [provider]  metoprolol succinate (TOPROL-XL) 25 MG 24 hr tablet Take 1 tablet (25 mg total) by mouth daily. Take with or immediately following a meal. 08/31/18   Lendon Colonel, NP  MOVANTIK 12.5 MG TABS tablet Take 1 tablet (12.5 mg total) by mouth daily. 07/05/18   Dhungel, Flonnie Overman, MD  Multiple Vitamin (MULTIVITAMIN WITH MINERALS) TABS tablet Take 1 tablet by mouth daily. 02/20/17   Robbie Lis, MD  naproxen sodium (ALEVE) 220 MG tablet Take 440 mg by mouth 2 (two) times daily as needed (pain).    [provider]  polyethylene glycol (MIRALAX / GLYCOLAX) packet Take 17 g by mouth daily as needed for mild constipation.    [provider]  potassium chloride SA (K-DUR,KLOR-CON) 20 MEQ tablet Take 1 tablet (20 mEq total) by mouth daily. 07/06/18   Dhungel, Nishant, MD  sacubitril-valsartan (ENTRESTO) 49-51 MG Take 1 tablet by mouth 2 (two) times daily. 07/05/18   Dhungel, Flonnie Overman, MD  thiamine 100 MG tablet Take 1 tablet (100 mg total) by mouth daily. 02/20/17   Robbie Lis, MD  traZODone (DESYREL) 50 MG tablet Take 1 tablet (50 mg total) by mouth at bedtime. 02/20/17   Robbie Lis, MD  XTAMPZA ER 18 MG C12A Take 1 tablet by mouth 2 (two) times daily. 07/05/18   Louellen Molder, MD    Family History Family History  Problem Relation Age of Onset  . Asthma Maternal Grandmother   . Coronary artery disease Mother        dysrrhythmia  . Drug abuse Brother   . Heart Problems Father   .  Cancer Neg Hx     Social History Social History   Tobacco Use  . Smoking status: Former Smoker    Last attempt to quit: 08/19/1978    Years since quitting: 40.0  . Smokeless tobacco: Never Used  . Tobacco comment: 25 years ago as of 2012  Substance Use Topics  . Alcohol use: Yes    Alcohol/week: 9.0 standard drinks    Types: 9 Glasses of wine  per week    Comment: wine with dinner   . Drug use: No     Allergies   Patient has no known allergies.   Review of Systems Review of Systems  All other systems reviewed and are negative.    Physical Exam Updated Vital Signs Temp 98.1 F (36.7 C) (Oral)   Ht 5\' 3"  (1.6 m)   BMI 19.93 kg/m   Physical Exam Vitals signs and nursing note reviewed.  Constitutional:      General: She is not in acute distress.    Appearance: She is well-developed. She is not diaphoretic.  HENT:     Head: Normocephalic and atraumatic.  Eyes:     Extraocular Movements: Extraocular movements intact.     Pupils: Pupils are equal, round, and reactive to light.  Neck:     Musculoskeletal: Normal range of motion and neck supple.  Cardiovascular:     Rate and Rhythm: Normal rate and regular rhythm.     Heart sounds: No murmur. No friction rub. No gallop.   Pulmonary:     Effort: Pulmonary effort is normal. No respiratory distress.     Breath sounds: Normal breath sounds. No wheezing.  Abdominal:     General: Bowel sounds are normal. There is no distension.     Palpations: Abdomen is soft.     Tenderness: There is no abdominal tenderness.  Musculoskeletal: Normal range of motion.     Comments: There is tenderness to palpation over the lateral aspect of the left hip.  DP pulses are palpable bilaterally.  Sensation and motor are intact to the entire left foot.  Skin:    General: Skin is warm and dry.  Neurological:     General: No focal deficit present.     Mental Status: She is alert and oriented to person, place, and time.     Cranial Nerves: No  cranial nerve deficit.     Coordination: Coordination normal.      ED Treatments / Results  Labs (all labs ordered are listed, but only abnormal results are displayed) Labs Reviewed  BASIC METABOLIC PANEL  CBC WITH DIFFERENTIAL/PLATELET  PROTIME-INR  TYPE AND SCREEN    EKG None  Radiology No results found.  Procedures Procedures (including critical care time)  Medications Ordered in ED Medications - No data to display   Initial Impression / Assessment and Plan / ED Course  I have reviewed the triage vital signs and the nursing notes.  Pertinent labs & imaging results that were available during my care of the patient were reviewed by me and considered in my medical decision making (see chart for details).  Patient with left hip pain after a fall.  Her x-rays reveal a comminuted, displaced, intertrochanteric hip fracture with subtrochanteric extension.  This finding was discussed with Dr. Wynelle Link from orthopedics who will evaluate the patient in the morning.  He would like her to be admitted by the hospitalist and will be seen in the morning.  I spoken with Dr. Roel Cluck who agrees to admit.    Head CT negative.  This was obtained that she did bump her head and had some swelling to the occiput.  Final Clinical Impressions(s) / ED Diagnoses   Final diagnoses:  None    ED Discharge Orders    None       Veryl Speak, MD 09/07/18 [id number]    Veryl Speak, MD 09/07/18 2309

## 2018-09-07 NOTE — Progress Notes (Signed)
PT CALLED BACK

## 2018-09-07 NOTE — ED Triage Notes (Signed)
Arrived via EMS from assisted living, [facility], [address] [place]. Unwitnessed fall in RR, patient reports tripping over her own foot, reports to hitting the back of her head, denies LOC,pain 8/10 in L hip, outward rotation and patient is unable to straighten, when EMS picked patient up a "crack" was heard. Patient is in a pelvic binder. EMS placed a 20 R AC, 200 mcg Fentanyl given en route, 8/10 pain. -Hx of anxiety and takes Prozac  -Normally on oxygen because CHF  Vitals  -BP 160/100 -HR 76 -EKG sinus rhythm  -O2 3 L [place], 96%

## 2018-09-07 NOTE — Addendum Note (Signed)
Addended by: Waylan Rocher on: 09/07/2018 11:07 AM   Modules accepted: Orders

## 2018-09-08 ENCOUNTER — Other Ambulatory Visit: Payer: Self-pay

## 2018-09-08 ENCOUNTER — Ambulatory Visit: Payer: Commercial Managed Care - PPO | Admitting: Adult Health

## 2018-09-08 ENCOUNTER — Inpatient Hospital Stay (HOSPITAL_COMMUNITY): Payer: Commercial Managed Care - PPO

## 2018-09-08 DIAGNOSIS — I351 Nonrheumatic aortic (valve) insufficiency: Secondary | ICD-10-CM

## 2018-09-08 DIAGNOSIS — S72002A Fracture of unspecified part of neck of left femur, initial encounter for closed fracture: Secondary | ICD-10-CM

## 2018-09-08 DIAGNOSIS — Z01818 Encounter for other preprocedural examination: Secondary | ICD-10-CM

## 2018-09-08 DIAGNOSIS — I5181 Takotsubo syndrome: Secondary | ICD-10-CM

## 2018-09-08 LAB — CBC
HCT: 32.9 % — ABNORMAL LOW (ref 36.0–46.0)
Hemoglobin: 10.2 g/dL — ABNORMAL LOW (ref 12.0–15.0)
MCH: 30.8 pg (ref 26.0–34.0)
MCHC: 31 g/dL (ref 30.0–36.0)
MCV: 99.4 fL (ref 80.0–100.0)
NRBC: 0 % (ref 0.0–0.2)
Platelets: 341 10*3/uL (ref 150–400)
RBC: 3.31 MIL/uL — ABNORMAL LOW (ref 3.87–5.11)
RDW: 13.4 % (ref 11.5–15.5)
WBC: 13.1 10*3/uL — ABNORMAL HIGH (ref 4.0–10.5)

## 2018-09-08 LAB — ECHOCARDIOGRAM COMPLETE: Height: 63 in

## 2018-09-08 LAB — ABO/RH: ABO/RH(D): O POS

## 2018-09-08 LAB — BASIC METABOLIC PANEL
Anion gap: 8 (ref 5–15)
BUN: 18 mg/dL (ref 8–23)
CO2: 28 mmol/L (ref 22–32)
Calcium: 9.6 mg/dL (ref 8.9–10.3)
Chloride: 99 mmol/L (ref 98–111)
Creatinine, Ser: 0.72 mg/dL (ref 0.44–1.00)
GFR calc Af Amer: >60 mL/min (ref >60)
GFR calc non Af Amer: >60 mL/min (ref >60)
Glucose, Bld: 108 mg/dL — ABNORMAL HIGH (ref 70–99)
Potassium: 3.9 mmol/L (ref 3.5–5.1)
SODIUM: 135 mmol/L (ref 135–145)

## 2018-09-08 LAB — SURGICAL PCR SCREEN
MRSA, PCR: NEGATIVE
Staphylococcus aureus: NEGATIVE

## 2018-09-08 LAB — ALBUMIN: ALBUMIN: 3.1 g/dL — AB (ref 3.5–5.0)

## 2018-09-08 MED ORDER — NALOXEGOL OXALATE 12.5 MG PO TABS
12.5000 mg | ORAL_TABLET | Freq: Every day | ORAL | Status: DC
  Administered 2018-09-08 – 2018-09-12 (×5): 12.5 mg via ORAL
  Filled 2018-09-08 (×6): qty 1

## 2018-09-08 MED ORDER — SACUBITRIL-VALSARTAN 49-51 MG PO TABS
1.0000 | ORAL_TABLET | Freq: Two times a day (BID) | ORAL | Status: DC
  Administered 2018-09-08 – 2018-09-12 (×8): 1 via ORAL
  Filled 2018-09-08 (×11): qty 1

## 2018-09-08 MED ORDER — ENOXAPARIN SODIUM 30 MG/0.3ML ~~LOC~~ SOLN
30.0000 mg | Freq: Once | SUBCUTANEOUS | Status: AC
  Administered 2018-09-08: 30 mg via SUBCUTANEOUS
  Filled 2018-09-08: qty 0.3

## 2018-09-08 MED ORDER — FOLIC ACID 1 MG PO TABS
1.0000 mg | ORAL_TABLET | Freq: Every day | ORAL | Status: DC
  Administered 2018-09-08 – 2018-09-12 (×4): 1 mg via ORAL
  Filled 2018-09-08 (×4): qty 1

## 2018-09-08 MED ORDER — VITAMIN D (ERGOCALCIFEROL) 1.25 MG (50000 UNIT) PO CAPS
50000.0000 [IU] | ORAL_CAPSULE | ORAL | Status: DC
  Administered 2018-09-10: 50000 [IU] via ORAL
  Filled 2018-09-08: qty 1

## 2018-09-08 MED ORDER — METHOCARBAMOL 500 MG PO TABS
500.0000 mg | ORAL_TABLET | Freq: Four times a day (QID) | ORAL | Status: DC | PRN
  Administered 2018-09-08 – 2018-09-11 (×6): 500 mg via ORAL
  Filled 2018-09-08 (×7): qty 1

## 2018-09-08 MED ORDER — CYCLOBENZAPRINE HCL 5 MG PO TABS
5.0000 mg | ORAL_TABLET | Freq: Three times a day (TID) | ORAL | Status: DC | PRN
  Administered 2018-09-09 – 2018-09-11 (×3): 5 mg via ORAL
  Filled 2018-09-08 (×3): qty 1

## 2018-09-08 MED ORDER — POTASSIUM CHLORIDE CRYS ER 20 MEQ PO TBCR
20.0000 meq | EXTENDED_RELEASE_TABLET | Freq: Every day | ORAL | Status: DC
  Administered 2018-09-08 – 2018-09-12 (×4): 20 meq via ORAL
  Filled 2018-09-08 (×4): qty 1

## 2018-09-08 MED ORDER — DULOXETINE HCL 20 MG PO CPEP
20.0000 mg | ORAL_CAPSULE | Freq: Every day | ORAL | Status: DC
  Administered 2018-09-08: 20 mg via ORAL
  Filled 2018-09-08: qty 1

## 2018-09-08 MED ORDER — DULOXETINE HCL 60 MG PO CPEP
60.0000 mg | ORAL_CAPSULE | Freq: Every day | ORAL | Status: DC
  Administered 2018-09-09 – 2018-09-12 (×4): 60 mg via ORAL
  Filled 2018-09-08 (×4): qty 1

## 2018-09-08 MED ORDER — ORAL CARE MOUTH RINSE
15.0000 mL | Freq: Two times a day (BID) | OROMUCOSAL | Status: DC
  Administered 2018-09-08 – 2018-09-11 (×4): 15 mL via OROMUCOSAL

## 2018-09-08 MED ORDER — OXYCODONE HCL ER 20 MG PO T12A
20.0000 mg | EXTENDED_RELEASE_TABLET | Freq: Two times a day (BID) | ORAL | Status: DC
  Administered 2018-09-08 – 2018-09-12 (×10): 20 mg via ORAL
  Filled 2018-09-08 (×11): qty 1

## 2018-09-08 MED ORDER — VITAMIN B-1 100 MG PO TABS
100.0000 mg | ORAL_TABLET | Freq: Every day | ORAL | Status: DC
  Administered 2018-09-08 – 2018-09-12 (×4): 100 mg via ORAL
  Filled 2018-09-08 (×4): qty 1

## 2018-09-08 MED ORDER — METOPROLOL SUCCINATE ER 25 MG PO TB24
25.0000 mg | ORAL_TABLET | Freq: Every day | ORAL | Status: DC
  Administered 2018-09-08 – 2018-09-12 (×4): 25 mg via ORAL
  Filled 2018-09-08 (×4): qty 1

## 2018-09-08 MED ORDER — LORAZEPAM 0.5 MG PO TABS
0.5000 mg | ORAL_TABLET | Freq: Four times a day (QID) | ORAL | Status: DC | PRN
  Administered 2018-09-10: 0.5 mg via ORAL
  Filled 2018-09-08 (×2): qty 1

## 2018-09-08 MED ORDER — HYDROCODONE-ACETAMINOPHEN 5-325 MG PO TABS
1.0000 | ORAL_TABLET | Freq: Four times a day (QID) | ORAL | Status: DC | PRN
  Administered 2018-09-08 – 2018-09-09 (×3): 2 via ORAL
  Administered 2018-09-10: 1 via ORAL
  Administered 2018-09-10 – 2018-09-11 (×2): 2 via ORAL
  Administered 2018-09-12 (×2): 1 via ORAL
  Filled 2018-09-08 (×2): qty 2
  Filled 2018-09-08: qty 1
  Filled 2018-09-08 (×2): qty 2
  Filled 2018-09-08: qty 1
  Filled 2018-09-08: qty 2
  Filled 2018-09-08 (×2): qty 1

## 2018-09-08 MED ORDER — TRAZODONE HCL 50 MG PO TABS
50.0000 mg | ORAL_TABLET | Freq: Every day | ORAL | Status: DC
  Administered 2018-09-08 – 2018-09-11 (×5): 50 mg via ORAL
  Filled 2018-09-08 (×5): qty 1

## 2018-09-08 MED ORDER — DONEPEZIL HCL 10 MG PO TABS
10.0000 mg | ORAL_TABLET | Freq: Every day | ORAL | Status: DC
  Administered 2018-09-08 – 2018-09-11 (×5): 10 mg via ORAL
  Filled 2018-09-08 (×5): qty 1

## 2018-09-08 MED ORDER — LACTATED RINGERS IV SOLN
INTRAVENOUS | Status: DC
  Administered 2018-09-08 – 2018-09-09 (×2): via INTRAVENOUS

## 2018-09-08 MED ORDER — POLYETHYLENE GLYCOL 3350 17 G PO PACK
17.0000 g | PACK | Freq: Every day | ORAL | Status: DC | PRN
  Administered 2018-09-08 – 2018-09-11 (×3): 17 g via ORAL
  Filled 2018-09-08 (×3): qty 1

## 2018-09-08 MED ORDER — METHOCARBAMOL 1000 MG/10ML IJ SOLN
500.0000 mg | Freq: Four times a day (QID) | INTRAVENOUS | Status: DC | PRN
  Filled 2018-09-08: qty 5

## 2018-09-08 NOTE — ED Notes (Signed)
While bringing patient to the floor, this nurse and NT Sam warned patient about an upcoming bump in the hallway, that we were going to go as easy as possible to avoid any more pain. This nurse turned around to tell the patient we did good, that we made it over the bump. The patient stated "Why are you smiling, theres nothing to smile about." This nurse explained that I was saying we did good getting over the bump easily, not that smiling because she is in pain.

## 2018-09-08 NOTE — Progress Notes (Addendum)
Unable to complete admission due to patient confuse and caregiver asleep. Will report off to next shift nurse to F/U with plan of care.

## 2018-09-08 NOTE — Evaluation (Signed)
Clinical/Bedside Swallow Evaluation Patient Details  Name: Yolanda [name] MRN: [id number] Date of Birth: [birth year]  Today's Date: 09/08/2018 Time: SLP Start Time (ACUTE ONLY): 71 SLP Stop Time (ACUTE ONLY): 1700 SLP Time Calculation (min) (ACUTE ONLY): 30 min  Past Medical History:  Past Medical History:  Diagnosis Date  . Adenomatous colon polyp    tubular  . Anxiety disorder    panic attacks, PMH of. Dr  Janna Arch  . 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 Diverticulosis   . Empyema ([place])    Left   . Fibroids    Lupron shots  . GERD (gastroesophageal reflux disease)   . High altitude sickness 2008  . Premature menopause    due to Lupron   Past Surgical History:  Past Surgical History:  Procedure Laterality Date  . BREAST LUMPECTOMY  1984   radiation , R breast  . CESAREAN SECTION     X 2  . colonoscopy with polypectomy  2011   Dr  Delfin Edis  . LAMINECTOMY     X3 L-Sspine  . MASTECTOMY  1990   R breast  . MASTECTOMY  1996   L   HPI:  85 yo female resident of ILF adm to [facility] after fall - found to have closed left hip fx.  Now for surgery 1/22 at approx 830 pm per RN report.  Pt with PMH + for breast cancer s/p double masectomy - denies radiation or chemo, Takotsubo CM, CHF EF 35%, anxiety, depression, dementia, pna 06/2018, thoracentesis 2018, stroke impacting vision and left side of body (stroke was cerebellum, right thalamus and occipital region).  Swallow eval ordered.  Pt did undergo MBS in November 2019 with findings of near normal swallow.  Swallow evaluation ordered during this hospital admit.     Assessment / Plan / Recommendation Clinical Impression  Pt with functional oropharyngeal swallow at this time.  She passed 3 ounce water test without difficulties.  Able to masticate solids and clear oral cavity well without residuals, timely clinical swallow with clear voice throughout.   Recommend continue regular diet.   Pt will benefit from follow up from SLP briefly for swallow safety due to her h/o dementia, right middle lobe pna 06/2018, thoracentesis 2018 and increased risk factors for aspiration pna (decreased mobility, etc)  Educated pt and her caregiver to recommendations using teach back and provided written precautions.    SLP Visit Diagnosis: Dysphagia, unspecified (R13.10)    Aspiration Risk  Mild aspiration risk    Diet Recommendation Regular;Thin liquid   Liquid Administration via: Cup;Straw Medication Administration: Whole meds with liquid(if sleepy after surgery - with applesauce) Supervision: Patient able to self feed Compensations: Slow rate;Small sips/bites Postural Changes: Seated upright at 90 degrees;Remain upright for at least 30 minutes after po intake(as able)    Other  Recommendations Oral Care Recommendations: Oral care BID   Follow up Recommendations None      Frequency and Duration min 1 x/week  1 week       Prognosis Prognosis for Safe Diet Advancement: Good      Swallow Study   General HPI: 85 yo female resident of ILF adm to [facility] after fall - found to have closed left hip fx.  Now for surgery 1/22 at approx 830 pm per RN report.  Pt with PMH + for breast cancer s/p double masectomy - denies radiation or chemo, Takotsubo CM, CHF EF 35%, anxiety, depression, dementia, pna  06/2018, thoracentesis 2018, stroke impacting vision and left side of body (stroke was cerebellum, right thalamus and occipital region).  Swallow eval ordered.  Pt did undergo MBS in November 2019 with findings of near normal swallow.  Swallow evaluation ordered during this hospital admit.   Type of Study: MBS-Modified Barium Swallow Study Previous Swallow Assessment: see notes Diet Prior to this Study: Regular;Thin liquids Temperature Spikes Noted: No Respiratory Status: Nasal cannula History of Recent Intubation: No Behavior/Cognition: Alert;Cooperative;Pleasant mood Oral Cavity Assessment: Within  Functional Limits Oral Care Completed by SLP: No Oral Cavity - Dentition: Adequate natural dentition Vision: Functional for self-feeding Self-Feeding Abilities: Able to feed self(does better with finger foods due to left hand closed at rest) Patient Positioning: Other (comment)(as upright as able due to pt's gross pain) Baseline Vocal Quality: Normal Volitional Cough: Strong Volitional Swallow: Able to elicit    Oral/Motor/Sensory Function Overall Oral Motor/Sensory Function: Within functional limits(no focal CN deficits impacting swallow musculature)   Ice Chips Ice chips: Not tested   Thin Liquid Thin Liquid: Within functional limits Presentation: Cup;Straw    Nectar Thick Nectar Thick Liquid: Not tested   Honey Thick Honey Thick Liquid: Not tested   Puree Puree: Within functional limits Presentation: Self Fed;Spoon   Solid     Solid: Within functional limits Presentation: Self Fed;Spoon      Macario Golds 09/08/2018,5:44 PM   Luanna Salk, MS [facility] SLP [place] Pager [phone] Office [phone]

## 2018-09-08 NOTE — Progress Notes (Signed)
  Echocardiogram 2D Echocardiogram has been performed.  Trannie Bardales L Androw 09/08/2018, 2:10 PM

## 2018-09-08 NOTE — Consult Note (Addendum)
Patient ID: Yolanda [name] MRN: [id number] DOB/AGE: 10-03-1933 85 y.o.  Admit date: 09/07/2018  Chief Complaint: Left hip pain  Subjective: Yolanda [name] is a 85 y/o female who presented to [facility] Emergency Department on 09/07/2018 for left hip pain following a fall. Patient is a resident at [facility], reports she had just transferred back from a SNF following a hospital admission for CHF exacerbation. States she was reaching for something on a shelf and lost her balance. She had immediate pain in the left hip and was unable to bear weight afterwards. EMS was called and patient was brought to the ED, where she was found to have a comminuted fracture of the intertrochanteric left hip with mild displacement. She was admitted to the hospital and orthopedics was consulted for fracture management. Home anticoagulation regimen of 81 mg ASA daily.  Allergies: No Known Allergies   Medications: Medications Prior to Admission  Medication Sig Dispense Refill Last Dose  . acetaminophen (TYLENOL) 500 MG tablet Take 1,000 mg by mouth every 8 (eight) hours as needed.   unk  . Cholecalciferol (VITAMIN D3) 1.25 MG (50000 UT) CAPS Take 1 capsule by mouth once a week.   0 unk  . cyclobenzaprine (FLEXERIL) 5 MG tablet Take 5 mg by mouth 3 (three) times daily as needed for muscle spasms.   0 unk  . donepezil (ARICEPT) 10 MG tablet Take 10 mg by mouth at bedtime.   1 unk  . DULoxetine (CYMBALTA) 20 MG capsule Take 20 mg by mouth daily.   unk  . DULoxetine (CYMBALTA) 60 MG capsule Take 1 capsule by mouth daily.  5 unk  . famotidine (PEPCID) 20 MG tablet Take 1 tablet (20 mg total) by mouth 2 (two) times daily. 60 tablet 0 unk  . feeding supplement, ENSURE ENLIVE, (ENSURE ENLIVE) LIQD Take 237 mLs by mouth 2 (two) times daily between meals. 237 mL 12 unk  . folic acid (FOLVITE) 1 MG tablet Take 1 tablet (1 mg total) by mouth daily. 30 tablet 0 unk  . furosemide (LASIX) 40 MG  tablet Take 1 tablet (40 mg total) by mouth daily. (Patient taking differently: Take 40 mg by mouth daily as needed for fluid. ) 60 tablet 0 unk  . ibandronate (BONIVA) 150 MG tablet Take 150 mg by mouth every 30 (thirty) days.   3 unk  . LORazepam (ATIVAN) 0.5 MG tablet Take 0.5 mg by mouth every 6 (six) hours as needed for anxiety.   unk  . metoprolol succinate (TOPROL-XL) 25 MG 24 hr tablet Take 1 tablet (25 mg total) by mouth daily. Take with or immediately following a meal. 30 tablet 6 unk  . MOVANTIK 12.5 MG TABS tablet Take 1 tablet (12.5 mg total) by mouth daily. 10 tablet 0 unk  . Multiple Vitamin (MULTIVITAMIN WITH MINERALS) TABS tablet Take 1 tablet by mouth daily. 30 tablet 0 unk  . naproxen sodium (ALEVE) 220 MG tablet Take 440 mg by mouth 2 (two) times daily as needed (pain).   unk  . polyethylene glycol (MIRALAX / GLYCOLAX) packet Take 17 g by mouth daily as needed for mild constipation.   09/07/2018 at Unknown time  . potassium chloride SA (K-DUR,KLOR-CON) 20 MEQ tablet Take 1 tablet (20 mEq total) by mouth daily. 10 tablet 0 unk  . PROZAC 20 MG capsule Take 20 mg by mouth daily.   unk  . sacubitril-valsartan (ENTRESTO) 49-51 MG Take 1 tablet by mouth 2 (two)  times daily. 60 tablet 0 unk  . thiamine 100 MG tablet Take 1 tablet (100 mg total) by mouth daily. 30 tablet 0 unk  . traZODone (DESYREL) 50 MG tablet Take 1 tablet (50 mg total) by mouth at bedtime. 15 tablet 0 unk  . XTAMPZA ER 18 MG C12A Take 1 tablet by mouth 2 (two) times daily. 10 each 0 09/07/2018 at Unknown time  . docusate (COLACE) 50 MG/5ML liquid Take 10 mLs (100 mg total) by mouth 2 (two) times daily as needed for mild constipation. (Patient not taking: Reported on 09/07/2018) 100 mL 0 Not Taking at Unknown time    Past Medical History: Past Medical History:  Diagnosis Date  . Adenomatous colon polyp    tubular  . Anxiety disorder    panic attacks, PMH of. Dr  Janna Arch  . 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 Diverticulosis   . Empyema ([place])    Left   . Fibroids    Lupron shots  . GERD (gastroesophageal reflux disease)   . High altitude sickness 2008  . Premature menopause    due to Lupron     Past Surgical History: Past Surgical History:  Procedure Laterality Date  . BREAST LUMPECTOMY  1984   radiation , R breast  . CESAREAN SECTION     X 2  . colonoscopy with polypectomy  2011   Dr  Delfin Edis  . LAMINECTOMY     X3 L-Sspine  . MASTECTOMY  1990   R breast  . MASTECTOMY  1996   L     Family History: Family History  Problem Relation Age of Onset  . Asthma Maternal Grandmother   . Coronary artery disease Mother        dysrrhythmia  . Drug abuse Brother   . Heart Problems Father   . Cancer Neg Hx     Social History: Social History   Tobacco Use  . Smoking status: Former Smoker    Last attempt to quit: 08/19/1978    Years since quitting: 40.0  . Smokeless tobacco: Never Used  . Tobacco comment: 25 years ago as of 2012  Substance Use Topics  . Alcohol use: Yes    Alcohol/week: 9.0 standard drinks    Types: 9 Glasses of wine per week    Comment: wine with dinner      Review of Systems Pertinent items are noted in HPI.  Physical Exam:  General: alert and oriented HENT: Head: Normocephalic, no lesions, without obvious abnormality. Neck:supple Musculoskeletal: Left lower extremity shortened Distal pulses intact 2+ Motor function intact, able to wiggle toes and foot Plantar/dorsiflexion intact Left hip shows no ecchymosis or swelling, no rashes or lesions present.  LABS: Results for orders placed or performed during the hospital encounter of 09/07/18  Surgical PCR screen  Result Value Ref Range   MRSA, PCR NEGATIVE NEGATIVE   Staphylococcus aureus NEGATIVE NEGATIVE  Basic metabolic panel  Result Value Ref Range   Sodium 136 135 - 145 mmol/L   Potassium 4.2 3.5 - 5.1 mmol/L   Chloride 99 98 - 111 mmol/L    CO2 30 22 - 32 mmol/L   Glucose, Bld 116 (H) 70 - 99 mg/dL   BUN 17 8 - 23 mg/dL   Creatinine, Ser 0.80 0.44 - 1.00 mg/dL   Calcium 9.7 8.9 - 10.3 mg/dL   GFR calc non Af Amer >60 >60 mL/min   GFR calc Af  Amer >60 >60 mL/min   Anion gap 7 5 - 15  CBC with Differential  Result Value Ref Range   WBC 11.6 (H) 4.0 - 10.5 K/uL   RBC 3.64 (L) 3.87 - 5.11 MIL/uL   Hemoglobin 11.2 (L) 12.0 - 15.0 g/dL   HCT 36.2 36.0 - 46.0 %   MCV 99.5 80.0 - 100.0 fL   MCH 30.8 26.0 - 34.0 pg   MCHC 30.9 30.0 - 36.0 g/dL   RDW 13.2 11.5 - 15.5 %   Platelets 369 150 - 400 K/uL   nRBC 0.0 0.0 - 0.2 %   Neutrophils Relative % 73 %   Neutro Abs 8.4 (H) 1.7 - 7.7 K/uL   Lymphocytes Relative 16 %   Lymphs Abs 1.9 0.7 - 4.0 K/uL   Monocytes Relative 7 %   Monocytes Absolute 0.9 0.1 - 1.0 K/uL   Eosinophils Relative 3 %   Eosinophils Absolute 0.3 0.0 - 0.5 K/uL   Basophils Relative 0 %   Basophils Absolute 0.0 0.0 - 0.1 K/uL   Immature Granulocytes 1 %   Abs Immature Granulocytes 0.06 0.00 - 0.07 K/uL  Protime-INR  Result Value Ref Range   Prothrombin Time 12.2 11.4 - 15.2 seconds   INR 0.91   Troponin I - Once  Result Value Ref Range   Troponin I <0.03 <0.03 ng/mL  CBC  Result Value Ref Range   WBC 13.1 (H) 4.0 - 10.5 K/uL   RBC 3.31 (L) 3.87 - 5.11 MIL/uL   Hemoglobin 10.2 (L) 12.0 - 15.0 g/dL   HCT 32.9 (L) 36.0 - 46.0 %   MCV 99.4 80.0 - 100.0 fL   MCH 30.8 26.0 - 34.0 pg   MCHC 31.0 30.0 - 36.0 g/dL   RDW 13.4 11.5 - 15.5 %   Platelets 341 150 - 400 K/uL   nRBC 0.0 0.0 - 0.2 %  Basic metabolic panel  Result Value Ref Range   Sodium 135 135 - 145 mmol/L   Potassium 3.9 3.5 - 5.1 mmol/L   Chloride 99 98 - 111 mmol/L   CO2 28 22 - 32 mmol/L   Glucose, Bld 108 (H) 70 - 99 mg/dL   BUN 18 8 - 23 mg/dL   Creatinine, Ser 0.72 0.44 - 1.00 mg/dL   Calcium 9.6 8.9 - 10.3 mg/dL   GFR calc non Af Amer >60 >60 mL/min   GFR calc Af Amer >60 >60 mL/min   Anion gap 8 5 - 15  Albumin  Result  Value Ref Range   Albumin 3.1 (L) 3.5 - 5.0 g/dL  Type and screen  Result Value Ref Range   ABO/RH(D) O POS    Antibody Screen NEG    Sample Expiration      09/10/2018 Performed at [facility], [place] [address], [place], [place] [address]   ABO/Rh  Result Value Ref Range   ABO/RH(D)      O POS Performed at [facility] [place], [place] [address]    Recent Labs    09/07/18 2111 09/08/18 0510  HGB 11.2* 10.2*   Recent Labs    09/07/18 2111 09/08/18 0510  WBC 11.6* 13.1*  RBC 3.64* 3.31*  HCT 36.2 32.9*  PLT 369 341   Recent Labs    09/07/18 2111 09/08/18 0510  NA 136 135  K 4.2 3.9  CL 99 99  CO2 30 28  BUN 17 18  CREATININE 0.80 0.72  GLUCOSE 116* 108*  CALCIUM 9.7 9.6   Recent Labs    09/07/18 2111  INR 0.91    Assessment/Plan: Assessment: Left hip fracture  Plan: Ms. Lasala has a complex fracture that will require operative management with Gaynelle Arabian, MD. Discussed risks and benefits and patient and son elect to proceed. We will plan on an IM nail for adequate fixation. We will order 30 mg lovenox for one dose today for DVT prophylaxis. She will be NPO at midnight, we will plan on operating tomorrow afternoon.  Theresa Duty, PA-C Orthopedic Surgery [facility] [place]  I have seen and examined the patient and agree with the above assessment exam and findings. She had a comminuted intertroch/subtroch femur fracture that will require fixation with an IM nail. Will plan on surgery tomorrow afternoon pending cardiac clearance.

## 2018-09-08 NOTE — Progress Notes (Signed)
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  Apparently, patient had a prolonged stay at a skilled nursing facility following discharge from the hospital after treatment for congestive heart failure.  Patient was reported to have fallen on the fourth of arrival back at the assisted living facility with subsequent closed left hip fracture.  Orthopedic team has been consulted.  Cardiology team has also been consulted as the patient has extensive cardiac history.  09/08/2018: Patient was seen alongside patient's nurse and son.  No new complaints.  Pain is well controlled.    Assessment & Plan:   Active Problems:   GERD   Dementia without behavioral disturbance ([place])   Takotsubo cardiomyopathy   Closed left hip fracture (HCC)  Closed left hip fracture: -Orthopedic team will be directing care.   -Cardiology input is highly appreciated.   -History of CHF, Takotsubo cardiomyopathy with EF of 30 to 35% noted. -Adequate pain control to prevent adrenergic surges. -Further care will depend on hospital course.  Takotsubo cardiomyopathy: -No acute symptoms of CHF. -On beta-blocker, Entresto and spironolactone. -Cardiology team is been consulted. -Further care as directed by the cardiology team.   Dementia without behavioral problems:  Patient is on donepezil.   Continue to manage expectantly.    Patient history of chronic pain: Optimize pain control.    DVT prophylaxis:  SCD   Code Status:  FULL CODE   as per family   Family Communication:    Son.    Disposition Plan:   This will depend on hospital course.                      Consults:  Orthopedics and cardiology.    Procedures:   Patient is currently being optimized for possible orthopedic surgery for left hip fracture.  Antimicrobials:   None   Subjective: Pain is well controlled. No chest pain. No shortness of breath.  Objective: Vitals:   09/07/18 2300 09/07/18 2330 09/08/18 0010 09/08/18 0457  BP:  (!) 151/73 (!) 152/72 132/67  Pulse: 72 78 76 76  Resp:  (!) 23 (!) 22 (!) 21  Temp:   (!) 97.5 F (36.4 C) 98.1 F (36.7 C)  TempSrc:   Oral   SpO2: 97% 100% 100% 98%  Height:        Intake/Output Summary (Last 24 hours) at 09/08/2018 1116 Last data filed at 09/08/2018 0121 Gross per 24 hour  Intake 60 ml  Output 0 ml  Net 60 ml   There were no vitals filed for this visit.  Examination:  General exam: Appears calm and comfortable  Respiratory system: Clear to auscultation.  Cardiovascular system: S1 & S2 heard. No pedal edema. Gastrointestinal system: Abdomen is nondistended, soft and nontender. No organomegaly or masses felt. Normal bowel sounds heard. Central nervous system: Awake and alert.    Data Reviewed: I have personally reviewed following labs and imaging studies  CBC: Recent Labs  Lab 09/07/18 2111 09/08/18 0510  WBC 11.6* 13.1*  NEUTROABS 8.4*  --   HGB 11.2* 10.2*  HCT 36.2 32.9*  MCV 99.5 99.4  PLT 369 409   Basic Metabolic Panel: Recent Labs  Lab 09/07/18 2111 09/08/18 0510  NA 136 135  K 4.2 3.9  CL 99 99  CO2 30 28  GLUCOSE 116* 108*  BUN 17 18  CREATININE 0.80 0.72  CALCIUM 9.7 9.6   GFR: Estimated Creatinine Clearance: 43.7 mL/min (by C-G formula based on SCr of 0.72 mg/dL). Liver Function Tests: Recent Labs  Lab 09/08/18 0510  ALBUMIN 3.1*   No results for input(s): LIPASE, AMYLASE in the last 168 hours. No results for input(s): AMMONIA in the last 168 hours. Coagulation Profile: Recent Labs  Lab  09/07/18 2111  INR 0.91   Cardiac Enzymes: Recent Labs  Lab 09/07/18 2111  TROPONINI <0.03   BNP (last 3 results) No results for input(s): PROBNP in the last 8760 hours. HbA1C: No results for input(s): HGBA1C in the last 72 hours. CBG: No results for input(s): GLUCAP in the last 168 hours. Lipid Profile: No results for input(s): CHOL, HDL, LDLCALC, TRIG, CHOLHDL, LDLDIRECT in the last 72 hours. Thyroid Function Tests: No results for input(s): TSH, T4TOTAL, FREET4, T3FREE, THYROIDAB in the last 72 hours. Anemia Panel: No results for input(s): VITAMINB12, FOLATE, FERRITIN, TIBC, IRON, RETICCTPCT in the last 72 hours. Urine analysis:    Component Value Date/Time   COLORURINE AMBER (A) 06/29/2018 1555   APPEARANCEUR CLEAR 06/29/2018 1555   LABSPEC 1.026 06/29/2018 1555   PHURINE 5.0 06/29/2018 1555   GLUCOSEU NEGATIVE 06/29/2018 1555   HGBUR NEGATIVE 06/29/2018 [place] 06/29/2018 1555   BILIRUBINUR neg 01/23/2012 1445   KETONESUR 20 (A) 06/29/2018 1555   PROTEINUR 30 (A) 06/29/2018 1555   UROBILINOGEN 0.2 01/23/2012 1445   NITRITE NEGATIVE 06/29/2018 1555   LEUKOCYTESUR NEGATIVE 06/29/2018 1555   Sepsis Labs: @LABRCNTIP (procalcitonin:4,lacticidven:4)  ) Recent Results (from the past 240 hour(s))  Surgical PCR screen     Status: None   Collection Time: 09/08/18 12:23 AM  Result Value Ref Range Status   MRSA, PCR NEGATIVE NEGATIVE Final   Staphylococcus aureus NEGATIVE NEGATIVE Final    Comment: (NOTE) The Xpert SA Assay (FDA approved for NASAL specimens in patients 18 years of age and older), is one component of a comprehensive surveillance program. It is not intended to diagnose infection nor to guide or monitor treatment. Performed at [facility], [place] [address], [place], [place] [address]          Radiology Studies: Ct Head Wo Contrast  Result Date: 09/07/2018 CLINICAL DATA:  Unwitnessed fall EXAM: CT HEAD WITHOUT  CONTRAST TECHNIQUE: Contiguous axial images were obtained from the base of the skull through the vertex without intravenous contrast. COMPARISON:  CT brain 09/02/2018 FINDINGS: Brain: No acute territorial infarction, hemorrhage, or intracranial mass. Mild motion degradation. Chronic infarcts in the right thalamus, right occipital lobe, and right cerebellum. Moderate atrophy. Mild small vessel ischemic changes of the white matter. Stable ventricle size. Vascular: No hyperdense vessels.  Carotid vascular calcification Skull: Fracture Sinuses/Orbits: No acute finding. Other: None IMPRESSION: 1. Mild motion degraded study 2. No definite CT evidence for acute intracranial abnormality 3. Atrophy and mild small vessel ischemic changes of the white matter. Old infarcts in the right thalamus, occipital lobe and right cerebellum. Electronically Signed   By: Donavan Foil M.D.   On: 09/07/2018 21:44   Dg Chest Portable 1 View  Result Date: 09/07/2018 CLINICAL DATA:  Preop, femur fracture EXAM: PORTABLE CHEST 1 VIEW COMPARISON:  09/07/2017, 08/31/2018 FINDINGS: Thoracic ascending stimulator  leads. No acute airspace disease. Stable cardiomediastinal silhouette. Aortic atherosclerosis. No pneumothorax. Surgical clips in the axillary regions. IMPRESSION: No active disease. Electronically Signed   By: Donavan Foil M.D.   On: 09/07/2018 21:52   Dg Hip Unilat W Or Wo Pelvis 2-3 Views Left  Result Date: 09/07/2018 CLINICAL DATA:  Initial evaluation for acute trauma, fall. EXAM: DG HIP (WITH OR WITHOUT PELVIS) 2-3V LEFT COMPARISON:  None. FINDINGS: Acute comminuted fracture of the intertrochanteric left femur with subtrochanteric extension with displacement. Femoral head remains normally position within the acetabulum. Bony pelvis intact. Limited views of the right hip demonstrate no acute finding. Osteopenia noted. Underlying osteoarthritic changes noted about the hips. Probable calcified fibroid overlies the mid pelvis.  IMPRESSION: Acute comminuted mildly displaced fracture of the intertrochanteric left hip with subtrochanteric extension. Electronically Signed   By: Jeannine Boga M.D.   On: 09/07/2018 21:39   Dg Femur Min 2 Views Left  Result Date: 09/07/2018 CLINICAL DATA:  Initial evaluation for acute trauma, fall. EXAM: LEFT FEMUR 2 VIEWS COMPARISON:  None. FINDINGS: No acute fracture about the visualized mid and distal left femur. Visualized proximal tibia and fibula intact. Osteoarthritic changes present about the knee. Diffuse osteopenia. No soft tissue abnormality. Vascular calcifications noted within the left leg. IMPRESSION: No other acute osseous abnormality about the mid-distal left femur. Electronically Signed   By: Jeannine Boga M.D.   On: 09/07/2018 21:41        Scheduled Meds: . donepezil  10 mg Oral QHS  . DULoxetine  20 mg Oral Daily  . DULoxetine  60 mg Oral Daily  . folic acid  1 mg Oral Daily  .  HYDROmorphone (DILAUDID) injection  1 mg Intravenous Once  . metoprolol succinate  25 mg Oral Daily  . naloxegol oxalate  12.5 mg Oral Daily  . oxyCODONE  20 mg Oral BID  . potassium chloride SA  20 mEq Oral Daily  . sacubitril-valsartan  1 tablet Oral BID  . thiamine  100 mg Oral Daily  . traZODone  50 mg Oral QHS  . [START ON 09/10/2018] Vitamin D (Ergocalciferol)  50,000 Units Oral Weekly   Continuous Infusions: . methocarbamol (ROBAXIN) IV       LOS: 1 day    Time spent: 25 minutes    Dana Allan, MD  [facility] Pager #: [phone] 7PM-7AM contact night coverage as above

## 2018-09-08 NOTE — Consult Note (Addendum)
Cardiology Consultation:   Patient ID: [name] MRN: [id number]; DOB: [birth year]  Admit date: 09/07/2018 Date of Consult: 09/08/2018  Primary Care Provider: Javier Glazier, MD Primary Cardiologist: Pixie Casino, MD  Primary Electrophysiologist:  None    Patient Profile:   Yolanda [name] is a 85 y.o. female with a hx of dementia, Takotsubo cardiomyopathy EF 35%, CHF, CVA, EtOH abuse, diverticulosis, fibroids, anxiety, depression and breast cancer s/p bil mastectomy and radiation therapy.  Who is being seen today for preoperative evaluation at the request of Dr Roel Cluck.  History of Present Illness:   [name] was recently transitioned from SNF to independent living.  She had a prolonged stay at [facility] after an admission for pneumonia and CHF in November.  She has had follow-up in our office since her hospital discharge, most recently on 08/31/2018 by Jory Sims, NP.  On that day the patient was complaining of increased dyspnea.  She was noted to be tachycardic at rest and had crackles in her left base.  Metoprolol XL 25 mg was added for better heart rate control and she was continued on Entresto and Lasix.  Chest x-ray showed a small left pleural effusion, no evidence of active pulmonary disease.  [name] reportedly had a fall yesterday while reaching out for an object on a high shelf and losing her balance.  She apparently hit her head on a cabinet behind her and post fall was unable to stand.  There was reportedly no loss of consciousness.  She had outward rotation of her left leg and was unable to straighten it.  X-ray of the left hip showed left hip fracture.  The patient is planned for surgery on Wednesday per orthopedics.  Her notes, patient at baseline is unable to walk a flight of stairs or 100 feet due to generalized fatigue and deconditioning.  The patient has been using oxygen at 2 L prn.  Upon my examination the patient is fairly lucid. She is aware that  she is here due to a fall. She says that she is in the hospital in [facility], but orients to [name] easily. She has her caregiver here with her. She states that she fell due to loss of balance while reaching for something. She denies having had any dizziness, chest discomfort, palpitations or dyspnea prior to or surrounding the event. Currently she is lying at about 25 degrees comfortably. She has no peripheral edema and lungs are clear.   In the ED diagnostics showed that she is anemic with hemoglobin 10.2.  Electrolytes and renal function are within normal limits.  CT of the head showed atrophy and mild small vessel ischemic changes, no evidence for acute intracranial abnormality, old infarcts in the right thalamus, occipital lobe and right cerebellum.  Chest x-ray showed no active disease  Past Medical History:  Diagnosis Date  . Adenomatous colon polyp    tubular  . Anxiety disorder    panic attacks, PMH of. Dr  Janna Arch  . 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 Diverticulosis   . Empyema ([place])    Left   . Fibroids    Lupron shots  . GERD (gastroesophageal reflux disease)   . High altitude sickness 2008  . Premature menopause    due to Lupron    Past Surgical History:  Procedure Laterality Date  . BREAST LUMPECTOMY  1984   radiation , R breast  . CESAREAN SECTION  X 2  . colonoscopy with polypectomy  2011   Dr  Delfin Edis  . LAMINECTOMY     X3 L-Sspine  . MASTECTOMY  1990   R breast  . MASTECTOMY  1996   L     Home Medications:  Prior to Admission medications   Medication Sig Start Date End Date Taking? Authorizing Provider  acetaminophen (TYLENOL) 500 MG tablet Take 1,000 mg by mouth every 8 (eight) hours as needed.   Yes [provider]  Cholecalciferol (VITAMIN D3) 1.25 MG (50000 UT) CAPS Take 1 capsule by mouth once a week.  05/07/18  Yes [provider]  cyclobenzaprine (FLEXERIL) 5 MG tablet  Take 5 mg by mouth 3 (three) times daily as needed for muscle spasms.  03/30/18  Yes [provider]  donepezil (ARICEPT) 10 MG tablet Take 10 mg by mouth at bedtime.  06/19/18  Yes [provider]  DULoxetine (CYMBALTA) 20 MG capsule Take 20 mg by mouth daily. 08/29/18  Yes [provider]  DULoxetine (CYMBALTA) 60 MG capsule Take 1 capsule by mouth daily. 07/13/18  Yes [provider]  famotidine (PEPCID) 20 MG tablet Take 1 tablet (20 mg total) by mouth 2 (two) times daily. 02/20/17  Yes Robbie Lis, MD  feeding supplement, ENSURE ENLIVE, (ENSURE ENLIVE) LIQD Take 237 mLs by mouth 2 (two) times daily between meals. 02/20/17  Yes Robbie Lis, MD  folic acid (FOLVITE) 1 MG tablet Take 1 tablet (1 mg total) by mouth daily. 02/20/17  Yes Robbie Lis, MD  furosemide (LASIX) 40 MG tablet Take 1 tablet (40 mg total) by mouth daily. Patient taking differently: Take 40 mg by mouth daily as needed for fluid.  07/27/18  Yes Barrett, Evelene Croon, PA-C  ibandronate (BONIVA) 150 MG tablet Take 150 mg by mouth every 30 (thirty) days.  06/13/18  Yes [provider]  LORazepam (ATIVAN) 0.5 MG tablet Take 0.5 mg by mouth every 6 (six) hours as needed for anxiety.   Yes [provider]  metoprolol succinate (TOPROL-XL) 25 MG 24 hr tablet Take 1 tablet (25 mg total) by mouth daily. Take with or immediately following a meal. 08/31/18  Yes Lendon Colonel, NP  MOVANTIK 12.5 MG TABS tablet Take 1 tablet (12.5 mg total) by mouth daily. 07/05/18  Yes Dhungel, Nishant, MD  Multiple Vitamin (MULTIVITAMIN WITH MINERALS) TABS tablet Take 1 tablet by mouth daily. 02/20/17  Yes Robbie Lis, MD  naproxen sodium (ALEVE) 220 MG tablet Take 440 mg by mouth 2 (two) times daily as needed (pain).   Yes [provider]  polyethylene glycol (MIRALAX / GLYCOLAX) packet Take 17 g by mouth daily as needed for mild constipation.   Yes [provider]  potassium chloride  SA (K-DUR,KLOR-CON) 20 MEQ tablet Take 1 tablet (20 mEq total) by mouth daily. 07/06/18  Yes Dhungel, Nishant, MD  PROZAC 20 MG capsule Take 20 mg by mouth daily. 08/31/18  Yes [provider]  sacubitril-valsartan (ENTRESTO) 49-51 MG Take 1 tablet by mouth 2 (two) times daily. 07/05/18  Yes Dhungel, Nishant, MD  thiamine 100 MG tablet Take 1 tablet (100 mg total) by mouth daily. 02/20/17  Yes Robbie Lis, MD  traZODone (DESYREL) 50 MG tablet Take 1 tablet (50 mg total) by mouth at bedtime. 02/20/17  Yes Robbie Lis, MD  XTAMPZA ER 18 MG C12A Take 1 tablet by mouth 2 (two) times daily. 07/05/18  Yes Dhungel, Flonnie Overman, MD  docusate (COLACE) 50 MG/5ML liquid Take 10 mLs (100 mg total) by mouth 2 (two) times daily as needed for mild constipation. Patient not taking: Reported on 09/07/2018 02/20/17   Robbie Lis, MD    Inpatient Medications: Scheduled Meds: . donepezil  10 mg Oral QHS  . DULoxetine  20 mg Oral Daily  . DULoxetine  60 mg Oral Daily  . folic acid  1 mg Oral Daily  .  HYDROmorphone (DILAUDID) injection  1 mg Intravenous Once  . metoprolol succinate  25 mg Oral Daily  . naloxegol oxalate  12.5 mg Oral Daily  . oxyCODONE  20 mg Oral BID  . potassium chloride SA  20 mEq Oral Daily  . sacubitril-valsartan  1 tablet Oral BID  . thiamine  100 mg Oral Daily  . traZODone  50 mg Oral QHS  . [START ON 09/10/2018] Vitamin D (Ergocalciferol)  50,000 Units Oral Weekly   Continuous Infusions: . methocarbamol (ROBAXIN) IV     PRN Meds: cyclobenzaprine, HYDROcodone-acetaminophen, HYDROmorphone (DILAUDID) injection, LORazepam, methocarbamol **OR** methocarbamol (ROBAXIN) IV, polyethylene glycol  Allergies:   No Known Allergies  Social History:   Social History   Socioeconomic History  . Marital status: Single    Spouse name: Not on file  . Number of children: Not on file  . Years of education: Not on file  . Highest education level: Not on file  Occupational History  .  Occupation: Scientist, product/process development: [employer]  . Financial resource strain: Not on file  . Food insecurity:    Worry: Not on file    Inability: Not on file  . Transportation needs:    Medical: Not on file    Non-medical: Not on file  Tobacco Use  . Smoking status: Former Smoker    Last attempt to quit: 08/19/1978    Years since quitting: 40.0  . Smokeless tobacco: Never Used  . Tobacco comment: 25 years ago as of 2012  Substance and Sexual Activity  . Alcohol use: Yes    Alcohol/week: 9.0 standard drinks    Types: 9 Glasses of wine per week    Comment: wine with dinner   . Drug use: No  . Sexual activity: Never  Lifestyle  . Physical activity:    Days per week: Not on file    Minutes per session: Not on file  . Stress: Not on file  Relationships  . Social connections:    Talks on phone: Not on file    Gets together: Not on file    Attends religious service: Not on file    Active member of club or organization: Not on file    Attends meetings of clubs or organizations: Not on file    Relationship status: Not on file  . Intimate partner violence:    Fear of current or ex partner: Not on file    Emotionally abused: Not on file    Physically abused: Not on file    Forced sexual activity: Not on file  Other Topics Concern  . Not on file  Social History Narrative   epworth sleepiness scale = 8 (11/24/15)    Family History:    Family History  Problem Relation Age of Onset  . Asthma Maternal Grandmother   . Coronary artery disease Mother        dysrrhythmia  . Drug abuse Brother   . Heart Problems Father   . Cancer Neg Hx  ROS:  Please see the history of present illness.   All other ROS reviewed and negative.     Physical Exam/Data:   Vitals:   09/07/18 2300 09/07/18 2330 09/08/18 0010 09/08/18 0457  BP:  (!) 151/73 (!) 152/72 132/67  Pulse: 72 78 76 76  Resp:  (!) 23 (!) 22 (!) 21  Temp:   (!) 97.5 F (36.4 C) 98.1 F (36.7 C)    TempSrc:   Oral   SpO2: 97% 100% 100% 98%  Height:        Intake/Output Summary (Last 24 hours) at 09/08/2018 0730 Last data filed at 09/08/2018 0010 Gross per 24 hour  Intake -  Output 0 ml  Net 0 ml   Last 3 Weights 08/31/2018 07/27/2018 07/05/2018  Weight (lbs) 112 lb 8 oz 113 lb 9.6 oz 121 lb 0.5 oz  Weight (kg) 51.03 kg 51.529 kg 54.9 kg     Body mass index is 19.93 kg/m.  General:  Thin, frail, elderly female, in no acute distress HEENT: normal Lymph: no adenopathy Neck: no JVD Endocrine:  No thryomegaly Vascular: No carotid bruits; FA pulses 2+ bilaterally without bruits  Cardiac:  normal S1, S2; RRR; no murmur  Lungs:  clear to auscultation bilaterally, no wheezing, rhonchi or rales  Abd: soft, nontender, no hepatomegaly  Ext: no edema Musculoskeletal:  Left hip pain with any movement Skin: warm and dry  Neuro:  CNs 2-12 intact, no focal abnormalities noted Psych:  Normal affect   EKG:  The EKG was personally reviewed and demonstrates:  Normal sinus rhythm, 74 bpm, Minimal voltage criteria for LVH, may be normal variant Telemetry:  Telemetry was personally reviewed and demonstrates: Sinus rhythm 70s-80s  Relevant CV Studies:  Echo 07/01/2018 Left ventricle: The cavity size was normal. Wall thickness was increased in a pattern of mild LVH. Systolic function was moderately to severely reduced. The estimated ejection fraction was in the range of 30% to 35%. There is akinesis of the mid-apicalanteroseptal, inferior, and apical myocardium. Doppler parameters are consistent with abnormal left ventricular relaxation (grade 1 diastolic dysfunction). - Aortic valve: There was moderate regurgitation. - Mitral valve: There was mild regurgitation. - Pulmonary arteries: Systolic pressure was moderately increased. PA peak pressure: 54 mm Hg (S).  Impressions: - Akinesis of the distal anterior, inferior and apical walls with overall moderate to severe LV  dysfunction; consider takotsubo cardiomyopathy; mild diastolic dysfunction; moderate AI; mild MR; mild TR; moderate pulmonary hypertension.  Laboratory Data:  Chemistry Recent Labs  Lab 09/07/18 2111 09/08/18 0510  NA 136 135  K 4.2 3.9  CL 99 99  CO2 30 28  GLUCOSE 116* 108*  BUN 17 18  CREATININE 0.80 0.72  CALCIUM 9.7 9.6  GFRNONAA >60 >60  GFRAA >60 >60  ANIONGAP 7 8    Recent Labs  Lab 09/08/18 0510  ALBUMIN 3.1*   Hematology Recent Labs  Lab 09/07/18 2111 09/08/18 0510  WBC 11.6* 13.1*  RBC 3.64* 3.31*  HGB 11.2* 10.2*  HCT 36.2 32.9*  MCV 99.5 99.4  MCH 30.8 30.8  MCHC 30.9 31.0  RDW 13.2 13.4  PLT 369 341   Cardiac Enzymes Recent Labs  Lab 09/07/18 2111  TROPONINI <0.03   No results for input(s): TROPIPOC in the last 168 hours.  BNPNo results for input(s): BNP, PROBNP in the last 168 hours.  DDimer No results for input(s): DDIMER in the last 168 hours.  Radiology/Studies:  Ct Head Wo Contrast  Result Date: 09/07/2018 CLINICAL  DATA:  Unwitnessed fall EXAM: CT HEAD WITHOUT CONTRAST TECHNIQUE: Contiguous axial images were obtained from the base of the skull through the vertex without intravenous contrast. COMPARISON:  CT brain 09/02/2018 FINDINGS: Brain: No acute territorial infarction, hemorrhage, or intracranial mass. Mild motion degradation. Chronic infarcts in the right thalamus, right occipital lobe, and right cerebellum. Moderate atrophy. Mild small vessel ischemic changes of the white matter. Stable ventricle size. Vascular: No hyperdense vessels.  Carotid vascular calcification Skull: Fracture Sinuses/Orbits: No acute finding. Other: None IMPRESSION: 1. Mild motion degraded study 2. No definite CT evidence for acute intracranial abnormality 3. Atrophy and mild small vessel ischemic changes of the white matter. Old infarcts in the right thalamus, occipital lobe and right cerebellum. Electronically Signed   By: Donavan Foil M.D.   On: 09/07/2018  21:44   Dg Chest Portable 1 View  Result Date: 09/07/2018 CLINICAL DATA:  Preop, femur fracture EXAM: PORTABLE CHEST 1 VIEW COMPARISON:  09/07/2017, 08/31/2018 FINDINGS: Thoracic ascending stimulator leads. No acute airspace disease. Stable cardiomediastinal silhouette. Aortic atherosclerosis. No pneumothorax. Surgical clips in the axillary regions. IMPRESSION: No active disease. Electronically Signed   By: Donavan Foil M.D.   On: 09/07/2018 21:52   Dg Hip Unilat W Or Wo Pelvis 2-3 Views Left  Result Date: 09/07/2018 CLINICAL DATA:  Initial evaluation for acute trauma, fall. EXAM: DG HIP (WITH OR WITHOUT PELVIS) 2-3V LEFT COMPARISON:  None. FINDINGS: Acute comminuted fracture of the intertrochanteric left femur with subtrochanteric extension with displacement. Femoral head remains normally position within the acetabulum. Bony pelvis intact. Limited views of the right hip demonstrate no acute finding. Osteopenia noted. Underlying osteoarthritic changes noted about the hips. Probable calcified fibroid overlies the mid pelvis. IMPRESSION: Acute comminuted mildly displaced fracture of the intertrochanteric left hip with subtrochanteric extension. Electronically Signed   By: Jeannine Boga M.D.   On: 09/07/2018 21:39   Dg Femur Min 2 Views Left  Result Date: 09/07/2018 CLINICAL DATA:  Initial evaluation for acute trauma, fall. EXAM: LEFT FEMUR 2 VIEWS COMPARISON:  None. FINDINGS: No acute fracture about the visualized mid and distal left femur. Visualized proximal tibia and fibula intact. Osteoarthritic changes present about the knee. Diffuse osteopenia. No soft tissue abnormality. Vascular calcifications noted within the left leg. IMPRESSION: No other acute osseous abnormality about the mid-distal left femur. Electronically Signed   By: Jeannine Boga M.D.   On: 09/07/2018 21:41    Assessment and Plan:   Preoperative evaluation -Patient has underlying dementia and poor functional status  and felt to be a poor candidate for any invasive cardiac procedures during admission in November.  She is being treated with optimal medical therapy for heart failure related to Takotsubo cardiomyopathy. Now with left hip fracture in need of surgical correction.  -The patient has poor functional status unable to walk a flight of stairs or 100 feet due to generalized fatigue and deconditioning. -Pt's volume status looks good and she is not having any chest pain or dyspnea.  -Given her history of CHF and stroke but no history of CAD, diabetes, or renal insufficiency, according to the RCRI this patient is a class III risk with a 6.6% risk of major cardiac event.  Given her reduced functional status her risk of perioperative complications is likely slightly higher. -Given the nature of the injury and need for surgery in order for the patient to maintain mobility and quality of life, the benefit of surgery outweighs the risk. Dr. Acie Fredrickson will come by later and hopefully  some family will be present to discuss risk/benefit.  -Further cardiac testing would not likely provide any new information. The echo that is ordered will give information but no likely change current care.  -Continue current treatment for CM as she tolerates.   Takotsubo cardiomyopathy -EF 30 to 35% on echo 07/01/2018 with apical and inferior akinesis. -Patient is being treated medically with Entresto 49-51 mg, Lasix as needed, and recent addition of metoprolol succinate 25 mg daily -Follow-up echocardiogram has been ordered by primary team, pending results  Aortic regurgitation -Moderate aortic regurgitation with mild mitral regurgitation on echo done in November   For questions or updates, please contact [place] Please consult [email or web address] for contact info under     Signed, Daune Perch, NP  09/08/2018 7:30 AM   Attending Note:   The patient was seen and examined.  Agree with assessment and plan as noted above.   Changes made to the above note as needed.  Patient seen and independently examined with  Pecolia Ades, NP .   We discussed all aspects of the encounter. I agree with the assessment and plan as stated above.  1.  History of systolic congestive heart failure: Patient was admitted in November with heart failure symptoms and was found to have an echocardiogram consistent with Takotsubo syndrome.  She has been on Entresto, Lasix and metoprolol.  I have reviewed the preliminary echo images and her left ventricular systolic function is now normal.  The Takotsubo appears to have resolved.   She is at low - moderate risk for her upcoming hip repair. She does not need any further testing from a cardiac standpoint .   2. Aortic insufficiency   Stable.  Has moderate AI .  This should not pose any significant perioperative issues.    I have spent a total of 40 minutes with patient reviewing hospital  notes , telemetry, EKGs, labs and examining patient as well as establishing an assessment and plan that was discussed with the patient. > 50% of time was spent in direct patient care.    Thayer Headings, Brooke Bonito., MD, [facility] 09/08/2018, 1:51 PM [address],  [place] Pager [phone]

## 2018-09-08 NOTE — H&P (View-Only) (Signed)
Patient ID: AVEYA BEAL MRN: [id number] DOB/AGE: 1934-06-15 85 y.o.  Admit date: 09/07/2018  Chief Complaint: Left hip pain  Subjective: Yolanda [name] is a 85 y/o female who presented to [facility] Emergency Department on 09/07/2018 for left hip pain following a fall. Patient is a resident at [facility] [place], reports she had just transferred back from a SNF following a hospital admission for CHF exacerbation. States she was reaching for something on a shelf and lost her balance. She had immediate pain in the left hip and was unable to bear weight afterwards. EMS was called and patient was brought to the ED, where she was found to have a comminuted fracture of the intertrochanteric left hip with mild displacement. She was admitted to the hospital and orthopedics was consulted for fracture management. Home anticoagulation regimen of 81 mg ASA daily.  Allergies: No Known Allergies   Medications: Medications Prior to Admission  Medication Sig Dispense Refill Last Dose  . acetaminophen (TYLENOL) 500 MG tablet Take 1,000 mg by mouth every 8 (eight) hours as needed.   unk  . Cholecalciferol (VITAMIN D3) 1.25 MG (50000 UT) CAPS Take 1 capsule by mouth once a week.   0 unk  . cyclobenzaprine (FLEXERIL) 5 MG tablet Take 5 mg by mouth 3 (three) times daily as needed for muscle spasms.   0 unk  . donepezil (ARICEPT) 10 MG tablet Take 10 mg by mouth at bedtime.   1 unk  . DULoxetine (CYMBALTA) 20 MG capsule Take 20 mg by mouth daily.   unk  . DULoxetine (CYMBALTA) 60 MG capsule Take 1 capsule by mouth daily.  5 unk  . famotidine (PEPCID) 20 MG tablet Take 1 tablet (20 mg total) by mouth 2 (two) times daily. 60 tablet 0 unk  . feeding supplement, ENSURE ENLIVE, (ENSURE ENLIVE) LIQD Take 237 mLs by mouth 2 (two) times daily between meals. 237 mL 12 unk  . folic acid (FOLVITE) 1 MG tablet Take 1 tablet (1 mg total) by mouth daily. 30 tablet 0 unk  . furosemide (LASIX) 40 MG  tablet Take 1 tablet (40 mg total) by mouth daily. (Patient taking differently: Take 40 mg by mouth daily as needed for fluid. ) 60 tablet 0 unk  . ibandronate (BONIVA) 150 MG tablet Take 150 mg by mouth every 30 (thirty) days.   3 unk  . LORazepam (ATIVAN) 0.5 MG tablet Take 0.5 mg by mouth every 6 (six) hours as needed for anxiety.   unk  . metoprolol succinate (TOPROL-XL) 25 MG 24 hr tablet Take 1 tablet (25 mg total) by mouth daily. Take with or immediately following a meal. 30 tablet 6 unk  . MOVANTIK 12.5 MG TABS tablet Take 1 tablet (12.5 mg total) by mouth daily. 10 tablet 0 unk  . Multiple Vitamin (MULTIVITAMIN WITH MINERALS) TABS tablet Take 1 tablet by mouth daily. 30 tablet 0 unk  . naproxen sodium (ALEVE) 220 MG tablet Take 440 mg by mouth 2 (two) times daily as needed (pain).   unk  . polyethylene glycol (MIRALAX / GLYCOLAX) packet Take 17 g by mouth daily as needed for mild constipation.   09/07/2018 at Unknown time  . potassium chloride SA (K-DUR,KLOR-CON) 20 MEQ tablet Take 1 tablet (20 mEq total) by mouth daily. 10 tablet 0 unk  . PROZAC 20 MG capsule Take 20 mg by mouth daily.   unk  . sacubitril-valsartan (ENTRESTO) 49-51 MG Take 1 tablet by mouth 2 (two)  times daily. 60 tablet 0 unk  . thiamine 100 MG tablet Take 1 tablet (100 mg total) by mouth daily. 30 tablet 0 unk  . traZODone (DESYREL) 50 MG tablet Take 1 tablet (50 mg total) by mouth at bedtime. 15 tablet 0 unk  . XTAMPZA ER 18 MG C12A Take 1 tablet by mouth 2 (two) times daily. 10 each 0 09/07/2018 at Unknown time  . docusate (COLACE) 50 MG/5ML liquid Take 10 mLs (100 mg total) by mouth 2 (two) times daily as needed for mild constipation. (Patient not taking: Reported on 09/07/2018) 100 mL 0 Not Taking at Unknown time    Past Medical History: Past Medical History:  Diagnosis Date  . Adenomatous colon polyp    tubular  . Anxiety disorder    panic attacks, PMH of. Dr  Janna Arch  . 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 Diverticulosis   . Empyema ([place])    Left   . Fibroids    Lupron shots  . GERD (gastroesophageal reflux disease)   . High altitude sickness 2008  . Premature menopause    due to Lupron     Past Surgical History: Past Surgical History:  Procedure Laterality Date  . BREAST LUMPECTOMY  1984   radiation , R breast  . CESAREAN SECTION     X 2  . colonoscopy with polypectomy  2011   Dr  Delfin Edis  . LAMINECTOMY     X3 L-Sspine  . MASTECTOMY  1990   R breast  . MASTECTOMY  1996   L     Family History: Family History  Problem Relation Age of Onset  . Asthma Maternal Grandmother   . Coronary artery disease Mother        dysrrhythmia  . Drug abuse Brother   . Heart Problems Father   . Cancer Neg Hx     Social History: Social History   Tobacco Use  . Smoking status: Former Smoker    Last attempt to quit: 08/19/1978    Years since quitting: 40.0  . Smokeless tobacco: Never Used  . Tobacco comment: 25 years ago as of 2012  Substance Use Topics  . Alcohol use: Yes    Alcohol/week: 9.0 standard drinks    Types: 9 Glasses of wine per week    Comment: wine with dinner      Review of Systems Pertinent items are noted in HPI.  Physical Exam:  General: alert and oriented HENT: Head: Normocephalic, no lesions, without obvious abnormality. Neck:supple Musculoskeletal: Left lower extremity shortened Distal pulses intact 2+ Motor function intact, able to wiggle toes and foot Plantar/dorsiflexion intact Left hip shows no ecchymosis or swelling, no rashes or lesions present.  LABS: Results for orders placed or performed during the hospital encounter of 09/07/18  Surgical PCR screen  Result Value Ref Range   MRSA, PCR NEGATIVE NEGATIVE   Staphylococcus aureus NEGATIVE NEGATIVE  Basic metabolic panel  Result Value Ref Range   Sodium 136 135 - 145 mmol/L   Potassium 4.2 3.5 - 5.1 mmol/L   Chloride 99 98 - 111 mmol/L    CO2 30 22 - 32 mmol/L   Glucose, Bld 116 (H) 70 - 99 mg/dL   BUN 17 8 - 23 mg/dL   Creatinine, Ser 0.80 0.44 - 1.00 mg/dL   Calcium 9.7 8.9 - 10.3 mg/dL   GFR calc non Af Amer >60 >60 mL/min   GFR calc Af  Amer >60 >60 mL/min   Anion gap 7 5 - 15  CBC with Differential  Result Value Ref Range   WBC 11.6 (H) 4.0 - 10.5 K/uL   RBC 3.64 (L) 3.87 - 5.11 MIL/uL   Hemoglobin 11.2 (L) 12.0 - 15.0 g/dL   HCT 36.2 36.0 - 46.0 %   MCV 99.5 80.0 - 100.0 fL   MCH 30.8 26.0 - 34.0 pg   MCHC 30.9 30.0 - 36.0 g/dL   RDW 13.2 11.5 - 15.5 %   Platelets 369 150 - 400 K/uL   nRBC 0.0 0.0 - 0.2 %   Neutrophils Relative % 73 %   Neutro Abs 8.4 (H) 1.7 - 7.7 K/uL   Lymphocytes Relative 16 %   Lymphs Abs 1.9 0.7 - 4.0 K/uL   Monocytes Relative 7 %   Monocytes Absolute 0.9 0.1 - 1.0 K/uL   Eosinophils Relative 3 %   Eosinophils Absolute 0.3 0.0 - 0.5 K/uL   Basophils Relative 0 %   Basophils Absolute 0.0 0.0 - 0.1 K/uL   Immature Granulocytes 1 %   Abs Immature Granulocytes 0.06 0.00 - 0.07 K/uL  Protime-INR  Result Value Ref Range   Prothrombin Time 12.2 11.4 - 15.2 seconds   INR 0.91   Troponin I - Once  Result Value Ref Range   Troponin I <0.03 <0.03 ng/mL  CBC  Result Value Ref Range   WBC 13.1 (H) 4.0 - 10.5 K/uL   RBC 3.31 (L) 3.87 - 5.11 MIL/uL   Hemoglobin 10.2 (L) 12.0 - 15.0 g/dL   HCT 32.9 (L) 36.0 - 46.0 %   MCV 99.4 80.0 - 100.0 fL   MCH 30.8 26.0 - 34.0 pg   MCHC 31.0 30.0 - 36.0 g/dL   RDW 13.4 11.5 - 15.5 %   Platelets 341 150 - 400 K/uL   nRBC 0.0 0.0 - 0.2 %  Basic metabolic panel  Result Value Ref Range   Sodium 135 135 - 145 mmol/L   Potassium 3.9 3.5 - 5.1 mmol/L   Chloride 99 98 - 111 mmol/L   CO2 28 22 - 32 mmol/L   Glucose, Bld 108 (H) 70 - 99 mg/dL   BUN 18 8 - 23 mg/dL   Creatinine, Ser 0.72 0.44 - 1.00 mg/dL   Calcium 9.6 8.9 - 10.3 mg/dL   GFR calc non Af Amer >60 >60 mL/min   GFR calc Af Amer >60 >60 mL/min   Anion gap 8 5 - 15  Albumin  Result  Value Ref Range   Albumin 3.1 (L) 3.5 - 5.0 g/dL  Type and screen  Result Value Ref Range   ABO/RH(D) O POS    Antibody Screen NEG    Sample Expiration      09/10/2018 Performed at [facility], [place] [address], [place], [place] [address]   ABO/Rh  Result Value Ref Range   ABO/RH(D)      O POS Performed at [facility] [address] [place],  [address]    Recent Labs    09/07/18 2111 09/08/18 0510  HGB 11.2* 10.2*   Recent Labs    09/07/18 2111 09/08/18 0510  WBC 11.6* 13.1*  RBC 3.64* 3.31*  HCT 36.2 32.9*  PLT 369 341   Recent Labs    09/07/18 2111 09/08/18 0510  NA 136 135  K 4.2 3.9  CL 99 99  CO2 30 28  BUN 17 18  CREATININE 0.80 0.72  GLUCOSE 116* 108*  CALCIUM 9.7 9.6   Recent Labs    09/07/18 2111  INR 0.91    Assessment/Plan: Assessment: Left hip fracture  Plan: Ms. Lafosse has a complex fracture that will require operative management with Gaynelle Arabian, MD. Discussed risks and benefits and patient and son elect to proceed. We will plan on an IM nail for adequate fixation. We will order 30 mg lovenox for one dose today for DVT prophylaxis. She will be NPO at midnight, we will plan on operating tomorrow afternoon.  Theresa Duty, PA-C Orthopedic Surgery [facility] [place]  I have seen and examined the patient and agree with the above assessment exam and findings. She had a comminuted intertroch/subtroch femur fracture that will require fixation with an IM nail. Will plan on surgery tomorrow afternoon pending cardiac clearance.

## 2018-09-09 ENCOUNTER — Inpatient Hospital Stay (HOSPITAL_COMMUNITY): Payer: Commercial Managed Care - PPO

## 2018-09-09 ENCOUNTER — Inpatient Hospital Stay (HOSPITAL_COMMUNITY): Payer: Commercial Managed Care - PPO | Admitting: Anesthesiology

## 2018-09-09 ENCOUNTER — Encounter (HOSPITAL_COMMUNITY)
Admission: EM | Disposition: A | Discharge status: Skilled Nursing Facility | Payer: Self-pay | Source: Home / Self Care | Attending: Internal Medicine

## 2018-09-09 ENCOUNTER — Ambulatory Visit: Payer: Commercial Managed Care - PPO | Admitting: Internal Medicine

## 2018-09-09 HISTORY — PX: FEMUR IM NAIL: SHX1597

## 2018-09-09 LAB — CBC WITH DIFFERENTIAL/PLATELET
Abs Immature Granulocytes: 0.07 10*3/uL (ref 0.00–0.07)
Basophils Absolute: 0 10*3/uL (ref 0.0–0.1)
Basophils Relative: 0 %
Eosinophils Absolute: 0.3 10*3/uL (ref 0.0–0.5)
Eosinophils Relative: 2 %
HCT: 34.6 % — ABNORMAL LOW (ref 36.0–46.0)
Hemoglobin: 10.4 g/dL — ABNORMAL LOW (ref 12.0–15.0)
Immature Granulocytes: 1 %
Lymphocytes Relative: 14 %
Lymphs Abs: 1.8 10*3/uL (ref 0.7–4.0)
MCH: 31.1 pg (ref 26.0–34.0)
MCHC: 30.1 g/dL (ref 30.0–36.0)
MCV: 103.6 fL — ABNORMAL HIGH (ref 80.0–100.0)
Monocytes Absolute: 1.3 10*3/uL — ABNORMAL HIGH (ref 0.1–1.0)
Monocytes Relative: 10 %
Neutro Abs: 9.6 10*3/uL — ABNORMAL HIGH (ref 1.7–7.7)
Neutrophils Relative %: 73 %
Platelets: 314 10*3/uL (ref 150–400)
RBC: 3.34 MIL/uL — ABNORMAL LOW (ref 3.87–5.11)
RDW: 13.6 % (ref 11.5–15.5)
WBC: 13.1 10*3/uL — ABNORMAL HIGH (ref 4.0–10.5)
nRBC: 0 % (ref 0.0–0.2)

## 2018-09-09 LAB — GLUCOSE, CAPILLARY: Glucose-Capillary: 107 mg/dL — ABNORMAL HIGH (ref 70–99)

## 2018-09-09 LAB — VITAMIN D 25 HYDROXY (VIT D DEFICIENCY, FRACTURES): Vit D, 25-Hydroxy: 149 ng/mL — ABNORMAL HIGH (ref 30.0–100.0)

## 2018-09-09 SURGERY — INSERTION, INTRAMEDULLARY ROD, FEMUR
Anesthesia: General | Site: Hip | Laterality: Left

## 2018-09-09 MED ORDER — EPHEDRINE SULFATE-NACL 50-0.9 MG/10ML-% IV SOSY
PREFILLED_SYRINGE | INTRAVENOUS | Status: DC | PRN
  Administered 2018-09-09: 15 mg via INTRAVENOUS
  Administered 2018-09-09 (×2): 10 mg via INTRAVENOUS
  Administered 2018-09-09: 15 mg via INTRAVENOUS

## 2018-09-09 MED ORDER — PHENYLEPHRINE HCL 10 MG/ML IJ SOLN
INTRAMUSCULAR | Status: AC
  Filled 2018-09-09: qty 2

## 2018-09-09 MED ORDER — ROCURONIUM BROMIDE 50 MG/5ML IV SOSY
PREFILLED_SYRINGE | INTRAVENOUS | Status: DC | PRN
  Administered 2018-09-09: 20 mg via INTRAVENOUS
  Administered 2018-09-09: 30 mg via INTRAVENOUS

## 2018-09-09 MED ORDER — LIDOCAINE 2% (20 MG/ML) 5 ML SYRINGE
INTRAMUSCULAR | Status: AC
  Filled 2018-09-09: qty 5

## 2018-09-09 MED ORDER — PROPOFOL 10 MG/ML IV BOLUS
INTRAVENOUS | Status: AC
  Filled 2018-09-09: qty 20

## 2018-09-09 MED ORDER — PHENOL 1.4 % MT LIQD
1.0000 | OROMUCOSAL | Status: DC | PRN
  Filled 2018-09-09: qty 177

## 2018-09-09 MED ORDER — SODIUM CHLORIDE 0.9 % IV SOLN
INTRAVENOUS | Status: DC
  Administered 2018-09-09 – 2018-09-10 (×2): via INTRAVENOUS

## 2018-09-09 MED ORDER — ACETAMINOPHEN 10 MG/ML IV SOLN
1000.0000 mg | Freq: Four times a day (QID) | INTRAVENOUS | Status: DC
  Administered 2018-09-09: 650 mg via INTRAVENOUS
  Filled 2018-09-09: qty 100

## 2018-09-09 MED ORDER — PROPOFOL 10 MG/ML IV BOLUS
INTRAVENOUS | Status: DC | PRN
  Administered 2018-09-09: 100 mg via INTRAVENOUS

## 2018-09-09 MED ORDER — METOCLOPRAMIDE HCL 5 MG PO TABS: 5.0000 mg | ORAL_TABLET | Freq: Three times a day (TID) | ORAL | Status: DC | PRN

## 2018-09-09 MED ORDER — FENTANYL CITRATE (PF) 250 MCG/5ML IJ SOLN
INTRAMUSCULAR | Status: AC
  Filled 2018-09-09: qty 5

## 2018-09-09 MED ORDER — ONDANSETRON HCL 4 MG/2ML IJ SOLN
INTRAMUSCULAR | Status: DC | PRN
  Administered 2018-09-09: 4 mg via INTRAVENOUS

## 2018-09-09 MED ORDER — FENTANYL CITRATE (PF) 100 MCG/2ML IJ SOLN
INTRAMUSCULAR | Status: AC
  Filled 2018-09-09: qty 2

## 2018-09-09 MED ORDER — ONDANSETRON HCL 4 MG PO TABS: 4.0000 mg | ORAL_TABLET | Freq: Four times a day (QID) | ORAL | Status: DC | PRN

## 2018-09-09 MED ORDER — DOCUSATE SODIUM 100 MG PO CAPS
100.0000 mg | ORAL_CAPSULE | Freq: Two times a day (BID) | ORAL | Status: DC
  Administered 2018-09-09 – 2018-09-11 (×4): 100 mg via ORAL
  Filled 2018-09-09 (×4): qty 1

## 2018-09-09 MED ORDER — CEFAZOLIN SODIUM-DEXTROSE 2-4 GM/100ML-% IV SOLN
2.0000 g | Freq: Four times a day (QID) | INTRAVENOUS | Status: AC
  Administered 2018-09-09 – 2018-09-10 (×2): 2 g via INTRAVENOUS
  Filled 2018-09-09 (×2): qty 100

## 2018-09-09 MED ORDER — ONDANSETRON HCL 4 MG/2ML IJ SOLN
INTRAMUSCULAR | Status: AC
  Filled 2018-09-09: qty 2

## 2018-09-09 MED ORDER — SUGAMMADEX SODIUM 200 MG/2ML IV SOLN
INTRAVENOUS | Status: AC
  Filled 2018-09-09: qty 2

## 2018-09-09 MED ORDER — METOCLOPRAMIDE HCL 5 MG/ML IJ SOLN: 5.0000 mg | Freq: Three times a day (TID) | INTRAMUSCULAR | Status: DC | PRN

## 2018-09-09 MED ORDER — EPHEDRINE 5 MG/ML INJ
INTRAVENOUS | Status: AC
  Filled 2018-09-09: qty 10

## 2018-09-09 MED ORDER — SODIUM CHLORIDE 0.9 % IV SOLN
INTRAVENOUS | Status: DC | PRN
  Administered 2018-09-09: 50 ug/min via INTRAVENOUS

## 2018-09-09 MED ORDER — ONDANSETRON HCL 4 MG/2ML IJ SOLN: 4.0000 mg | Freq: Four times a day (QID) | INTRAMUSCULAR | Status: DC | PRN

## 2018-09-09 MED ORDER — ENOXAPARIN SODIUM 30 MG/0.3ML ~~LOC~~ SOLN
30.0000 mg | SUBCUTANEOUS | Status: DC
  Administered 2018-09-11 – 2018-09-12 (×2): 30 mg via SUBCUTANEOUS
  Filled 2018-09-09 (×2): qty 0.3

## 2018-09-09 MED ORDER — METHOCARBAMOL 500 MG IVPB - SIMPLE MED
INTRAVENOUS | Status: AC
  Administered 2018-09-09: 500 mg
  Filled 2018-09-09: qty 50

## 2018-09-09 MED ORDER — POVIDONE-IODINE 10 % EX SWAB
2.0000 "application " | Freq: Once | CUTANEOUS | Status: AC
  Administered 2018-09-09: 2 via TOPICAL

## 2018-09-09 MED ORDER — DEXAMETHASONE SODIUM PHOSPHATE 10 MG/ML IJ SOLN
INTRAMUSCULAR | Status: AC
  Filled 2018-09-09: qty 1

## 2018-09-09 MED ORDER — MENTHOL 3 MG MT LOZG
1.0000 | LOZENGE | OROMUCOSAL | Status: DC | PRN
  Filled 2018-09-09: qty 9

## 2018-09-09 MED ORDER — PHENYLEPHRINE 40 MCG/ML (10ML) SYRINGE FOR IV PUSH (FOR BLOOD PRESSURE SUPPORT)
PREFILLED_SYRINGE | INTRAVENOUS | Status: DC | PRN
  Administered 2018-09-09: 160 ug via INTRAVENOUS
  Administered 2018-09-09: 120 ug via INTRAVENOUS
  Administered 2018-09-09: 160 ug via INTRAVENOUS
  Administered 2018-09-09 (×3): 120 ug via INTRAVENOUS

## 2018-09-09 MED ORDER — FENTANYL CITRATE (PF) 100 MCG/2ML IJ SOLN
25.0000 ug | INTRAMUSCULAR | Status: DC | PRN
  Administered 2018-09-09: 50 ug via INTRAVENOUS
  Administered 2018-09-09: 25 ug via INTRAVENOUS

## 2018-09-09 MED ORDER — FENTANYL CITRATE (PF) 100 MCG/2ML IJ SOLN
INTRAMUSCULAR | Status: DC | PRN
  Administered 2018-09-09 (×2): 50 ug via INTRAVENOUS

## 2018-09-09 MED ORDER — LACTATED RINGERS IV SOLN: INTRAVENOUS | Status: DC

## 2018-09-09 MED ORDER — LIDOCAINE 2% (20 MG/ML) 5 ML SYRINGE
INTRAMUSCULAR | Status: DC | PRN
  Administered 2018-09-09: 40 mg via INTRAVENOUS

## 2018-09-09 MED ORDER — CEFAZOLIN SODIUM-DEXTROSE 2-4 GM/100ML-% IV SOLN
2.0000 g | INTRAVENOUS | Status: AC
  Administered 2018-09-09: 2 g via INTRAVENOUS
  Filled 2018-09-09: qty 100

## 2018-09-09 MED ORDER — DEXAMETHASONE SODIUM PHOSPHATE 10 MG/ML IJ SOLN
INTRAMUSCULAR | Status: DC | PRN
  Administered 2018-09-09: 5 mg via INTRAVENOUS

## 2018-09-09 MED ORDER — 0.9 % SODIUM CHLORIDE (POUR BTL) OPTIME
TOPICAL | Status: DC | PRN
  Administered 2018-09-09: 1000 mL

## 2018-09-09 MED ORDER — CHLORHEXIDINE GLUCONATE 4 % EX LIQD
60.0000 mL | Freq: Once | CUTANEOUS | Status: AC
  Administered 2018-09-09: 4 via TOPICAL
  Filled 2018-09-09: qty 60

## 2018-09-09 MED ORDER — SUGAMMADEX SODIUM 200 MG/2ML IV SOLN
INTRAVENOUS | Status: DC | PRN
  Administered 2018-09-09: 100 mg via INTRAVENOUS

## 2018-09-09 SURGICAL SUPPLY — 45 items
BAG SPEC THK2 15X12 ZIP CLS (MISCELLANEOUS) ×1
BAG ZIPLOCK 12X15 (MISCELLANEOUS) ×3 IMPLANT
BIT DRILL 4.3MMS DISTAL GRDTED (BIT) IMPLANT
BNDG GAUZE ELAST 4 BULKY (GAUZE/BANDAGES/DRESSINGS) ×3 IMPLANT
COVER PERINEAL POST (MISCELLANEOUS) ×3 IMPLANT
COVER SURGICAL LIGHT HANDLE (MISCELLANEOUS) ×3 IMPLANT
COVER WAND RF STERILE (DRAPES) IMPLANT
DRAPE INCISE IOBAN 66X45 STRL (DRAPES) ×3 IMPLANT
DRILL 4.3MMS DISTAL GRADUATED (BIT) ×6
DRSG MEPILEX BORDER 4X4 (GAUZE/BANDAGES/DRESSINGS) ×4 IMPLANT
DRSG MEPILEX BORDER 4X8 (GAUZE/BANDAGES/DRESSINGS) ×3 IMPLANT
DURAPREP 26ML APPLICATOR (WOUND CARE) ×3 IMPLANT
ELECT REM PT RETURN 15FT ADLT (MISCELLANEOUS) ×3 IMPLANT
FACESHIELD WRAPAROUND (MASK) ×9 IMPLANT
FACESHIELD WRAPAROUND OR TEAM (MASK) ×3 IMPLANT
GLOVE BIO SURGEON STRL SZ7 (GLOVE) ×3 IMPLANT
GLOVE BIO SURGEON STRL SZ8 (GLOVE) ×6 IMPLANT
GLOVE BIOGEL PI IND STRL 7.0 (GLOVE) ×1 IMPLANT
GLOVE BIOGEL PI IND STRL 8 (GLOVE) ×2 IMPLANT
GLOVE BIOGEL PI INDICATOR 7.0 (GLOVE) ×2
GLOVE BIOGEL PI INDICATOR 8 (GLOVE) ×4
GOWN STRL REUS W/TWL LRG LVL3 (GOWN DISPOSABLE) ×6 IMPLANT
GOWN STRL REUS W/TWL XL LVL3 (GOWN DISPOSABLE) ×3 IMPLANT
GUIDEPIN 3.2X17.5 THRD DISP (PIN) ×2 IMPLANT
GUIDEWIRE BALL NOSE 100CM (WIRE) ×2 IMPLANT
HFN A/R SCREW 90MM (Screw) ×2 IMPLANT
HIP FRAC NAIL LAG SCR 10.5X100 (Orthopedic Implant) ×2 IMPLANT
HIP FRAC NAIL LEFT 11X360MM (Orthopedic Implant) ×3 IMPLANT
KIT BASIN OR (CUSTOM PROCEDURE TRAY) ×3 IMPLANT
MANIFOLD NEPTUNE II (INSTRUMENTS) ×3 IMPLANT
NAIL HIP FRAC LEFT 11X360MM (Orthopedic Implant) IMPLANT
NS IRRIG 1000ML POUR BTL (IV SOLUTION) ×3 IMPLANT
PACK GENERAL/GYN (CUSTOM PROCEDURE TRAY) ×3 IMPLANT
PROTECTOR NERVE ULNAR (MISCELLANEOUS) ×3 IMPLANT
SCREW BONE CORTICAL 5.0X42 (Screw) ×2 IMPLANT
SCREW BONE CORTICAL 5.0X52 (Screw) ×2 IMPLANT
SCREW CANN THRD AFF 10.5X100 (Orthopedic Implant) IMPLANT
SCREW DRILL BIT ANIT ROTATION (BIT) ×2 IMPLANT
STAPLER VISISTAT 35W (STAPLE) ×3 IMPLANT
SUT VIC AB 1 CT1 27 (SUTURE) ×3
SUT VIC AB 1 CT1 27XBRD ANTBC (SUTURE) ×1 IMPLANT
SUT VIC AB 2-0 CT1 27 (SUTURE) ×3
SUT VIC AB 2-0 CT1 TAPERPNT 27 (SUTURE) ×1 IMPLANT
TOWEL OR 17X26 10 PK STRL BLUE (TOWEL DISPOSABLE) ×3 IMPLANT
TRAY FOLEY MTR SLVR 16FR STAT (SET/KITS/TRAYS/PACK) IMPLANT

## 2018-09-09 NOTE — Discharge Instructions (Addendum)
Dr. Gaynelle Arabian [facility] [address], [place], [place] [address] [phone]  HIP FRACTURE POST-OPERATIVE DISCHARGE INSTRUCTIONS   Hip Rehabilitation, Guidelines Following Surgery   BLOOD CLOT PREVENTION Inject once syringe (30 mg) of lovenox into the skin once a day for 10 days following surgery. Then take one 81 mg aspirin once a day for three weeks. Then discontinue aspirin.  HOME CARE INSTRUCTIONS   Remove items at home which could result in a fall. This includes throw rugs or furniture in walking pathways.   ICE to the affected hip every three hours for 30 minutes at a time and then as needed for pain and swelling.  Continue to use ice on the hip for pain and swelling from surgery. You may notice swelling that will progress down to the foot and ankle.  This is normal after surgery.  Elevate the leg when you are not up walking on it.    Continue to use the breathing machine which will help keep your temperature down.  It is common for your temperature to cycle up and down following surgery, especially at night when you are not up moving around and exerting yourself.  The breathing machine keeps your lungs expanded and your temperature down.  DRESSING / WOUND CARE / SHOWERING You may shower 3 days after surgery, but keep the wounds dry during showering.  You may use an occlusive plastic wrap (Press'n Seal for example), NO SOAKING/SUBMERGING IN THE BATHTUB.  If the bandage gets wet, change with a clean dry gauze.  If the incision gets wet, pat the wound dry with a clean towel. You may start showering once you are discharged home but do not submerge the incision under water. Just pat the incision dry and apply a dry gauze dressing on daily. Change the surgical dressing daily and reapply a dry dressing each time.  ACTIVITY Walk with your walker as instructed. Use walker as long as suggested by your caregivers. Avoid periods of inactivity  such as sitting longer than an hour when not asleep. This helps prevent blood clots.  Do not drive a car for 6 weeks or until released by you surgeon.  Do not drive while taking narcotics.  WEIGHT BEARING Partial weight bearing with assist device (walker, cane, etc) as directed, use it as long as suggested by your surgeon or therapist, typically at least 4-6 weeks.  MEDICATIONS See your medication summary on the After Visit Summary that the nursing staff will review with you prior to discharge.  You may have some home medications which will be placed on hold until you complete the course of blood thinner medication.  It is important for you to complete the blood thinner medication as prescribed by your surgeon.  Continue your approved medications as instructed at time of discharge.                                                 FOLLOW-UP APPOINTMENTS Make sure you keep all of your appointments after your operation with your surgeon and caregivers. You should call the office at the above phone number and make an appointment for approximately two weeks after the date of your surgery or on the date instructed by your surgeon outlined in the "After Visit Summary".  IF YOU ARE TRANSFERRED TO A SKILLED REHAB FACILITY If  the patient is transferred to a skilled rehab facility following release from the hospital, a list of the current medications will be sent to the facility for the patient to continue.  When discharged from the skilled rehab facility, please have the facility set up the patient's Hustler prior to being released. Also, the skilled facility will be responsible for providing the patient with their medications at time of release from the facility to include their pain medication, the muscle relaxants, and their blood thinner medication. If the patient is still at the rehab facility at time of the two week follow up appointment, the skilled rehab facility will also need to  assist the patient in arranging follow up appointment in our office and any transportation needs.  MAKE SURE YOU:   Understand these instructions.   Get help right away if you are not doing well or get worse.   Do not submerge incision under water. Please use good hand washing techniques while changing dressing each day. May shower starting three days after surgery. Please use a clean towel to pat the incision dry following showers. Continue to use ice for pain and swelling after surgery. Do not use any lotions or creams on the incision until instructed by your surgeon.

## 2018-09-09 NOTE — Interval H&P Note (Signed)
History and Physical Interval Note:  09/09/2018 4:25 PM  Yolanda [name]  has presented today for surgery, with the diagnosis of left intertrochanteric/subtrochanteric femur fracture  The various methods of treatment have been discussed with the patient and family. After consideration of risks, benefits and other options for treatment, the patient has consented to  Procedure(s) with comments: INTRAMEDULLARY (IM) NAIL FEMORAL (Left) - 60 minutes Ok per Con[place] to follow himself as a surgical intervention .  The patient's history has been reviewed, patient examined, no change in status, stable for surgery.  I have reviewed the patient's chart and labs.  Questions were answered to the patient's satisfaction.     Pilar Plate Kitty Cadavid

## 2018-09-09 NOTE — Anesthesia Procedure Notes (Addendum)
Procedure Name: Intubation Date/Time: 09/09/2018 5:58 PM Performed by: West Pugh, CRNA Pre-anesthesia Checklist: Patient identified, Emergency Drugs available, Suction available, Patient being monitored and Timeout performed Patient Re-evaluated:Patient Re-evaluated prior to induction Oxygen Delivery Method: Circle system utilized Preoxygenation: Pre-oxygenation with 100% oxygen Induction Type: IV induction Ventilation: Mask ventilation without difficulty Laryngoscope Size: Mac and 3 Grade View: Grade II Tube type: Oral Tube size: 7.0 mm Number of attempts: 1 Airway Equipment and Method: Stylet Placement Confirmation: ETT inserted through vocal cords under direct vision,  positive ETCO2,  CO2 detector and breath sounds checked- equal and bilateral Secured at: 21 cm Tube secured with: Tape Dental Injury: Teeth and Oropharynx as per pre-operative assessment

## 2018-09-09 NOTE — Progress Notes (Signed)
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  Apparently, patient had a prolonged stay at a skilled nursing facility following discharge from the hospital after treatment for congestive heart failure.  Patient was reported to have fallen on the fourth of arrival back at the assisted living facility with subsequent closed left hip fracture.  Orthopedic team has been consulted.  Cardiology team has also been consulted as the patient has extensive cardiac history.  09/09/2018: Patient seen and examined at bedside.  No acute events overnight.  She has no new complaints.  Plan for surgery today.    Assessment/Plan: Active Problems:   GERD   Dementia without behavioral disturbance (HCC)   Takotsubo cardiomyopathy   Closed left hip fracture (HCC)  Closed left hip fracture: -Orthopedic team following.  Plan for surgery today -Low to moderate risk for her upcoming hip repair per cardiology -History of CHF, Takotsubo cardiomyopathy with EF of 30 to 35% noted. -Pain management in place  History of Takotsubo cardiomyopathy: -No acute symptoms of CHF. -On beta-blocker, Entresto and spironolactone. -Cardiology team is been consulted. -Further care as directed by the cardiology team. -Repeat 2D echo done on 09/08/2018 revealed normal LVEF 60 to 65% with grade 1 diastolic dysfunction. -Continue cardiac medications as recommended by cardiology  Dementia without behavioral problems:  Patient is on donepezil.   Continue to manage expectantly.    Patient history of chronic  pain: Optimize pain control.    DVT prophylaxis:SCDs  Code Status:FULL CODE as per family  Family Communication:None at bedside.  Her personal sister was present at the time of this visit. Disposition Plan:  Possible SNF for physical rehab after surgery Consults: Orthopedics and cardiology.    Procedures:  Possible surgical repair on 09/09/2018  Antimicrobials:   None    Objective: Vitals:   09/08/18 0457 09/08/18 2017 09/09/18 0423 09/09/18 1405  BP: 132/67 (!) 90/47 (!) 119/57 (!) 105/55  Pulse: 76 79 92 78  Resp: (!) 21 14 18    Temp: 98.1 F (36.7 C) 99 F (37.2 C) (!) 97.5 F (36.4 C) 98 F (36.7 C)  TempSrc:  Oral Oral Oral  SpO2: 98% 97% 98% 100%  Height:        Intake/Output Summary (Last 24 hours) at 09/09/2018 1519 Last data filed at 09/09/2018 1414 Gross per 24 hour  Intake 1155.19 ml  Output 900 ml  Net 255.19 ml   There were no vitals filed for this visit.  Exam:  . General: 85 y.o. year-old female well developed well nourished in no acute distress.  Alert in the setting of dementia. . Cardiovascular: Regular rate and rhythm with no rubs or gallops.  No thyromegaly or JVD noted.   Marland Kitchen Respiratory: Clear to auscultation with no wheezes or rales. Good inspiratory effort. . Abdomen: Soft nontender nondistended with normal bowel sounds x4 quadrants. . Musculoskeletal: Trace lower extremity edema. 2/4 pulses in all 4 extremities. Marland Kitchen Psychiatry: Mood is appropriate for condition and setting   Data Reviewed: CBC: Recent Labs  Lab 09/07/18 2111 09/08/18 0510 09/09/18 0522  WBC 11.6* 13.1* 13.1*  NEUTROABS 8.4*  --  9.6*  HGB 11.2*  10.2* 10.4*  HCT 36.2 32.9* 34.6*  MCV 99.5 99.4 103.6*  PLT 369 341 209   Basic Metabolic Panel: Recent Labs  Lab 09/07/18 2111 09/08/18 0510  NA 136 135  K 4.2 3.9  CL 99 99  CO2 30 28  GLUCOSE 116* 108*  BUN 17 18  CREATININE 0.80 0.72  CALCIUM 9.7 9.6   GFR: Estimated  Creatinine Clearance: 43.7 mL/min (by C-G formula based on SCr of 0.72 mg/dL). Liver Function Tests: Recent Labs  Lab 09/08/18 0510  ALBUMIN 3.1*   No results for input(s): LIPASE, AMYLASE in the last 168 hours. No results for input(s): AMMONIA in the last 168 hours. Coagulation Profile: Recent Labs  Lab 09/07/18 2111  INR 0.91   Cardiac Enzymes: Recent Labs  Lab 09/07/18 2111  TROPONINI <0.03   BNP (last 3 results) No results for input(s): PROBNP in the last 8760 hours. HbA1C: No results for input(s): HGBA1C in the last 72 hours. CBG: Recent Labs  Lab 09/09/18 0737  GLUCAP 107*   Lipid Profile: No results for input(s): CHOL, HDL, LDLCALC, TRIG, CHOLHDL, LDLDIRECT in the last 72 hours. Thyroid Function Tests: No results for input(s): TSH, T4TOTAL, FREET4, T3FREE, THYROIDAB in the last 72 hours. Anemia Panel: No results for input(s): VITAMINB12, FOLATE, FERRITIN, TIBC, IRON, RETICCTPCT in the last 72 hours. Urine analysis:    Component Value Date/Time   COLORURINE AMBER (A) 06/29/2018 1555   APPEARANCEUR CLEAR 06/29/2018 1555   LABSPEC 1.026 06/29/2018 1555   PHURINE 5.0 06/29/2018 1555   GLUCOSEU NEGATIVE 06/29/2018 1555   HGBUR NEGATIVE 06/29/2018 [place] 06/29/2018 1555   BILIRUBINUR neg 01/23/2012 1445   KETONESUR 20 (A) 06/29/2018 1555   PROTEINUR 30 (A) 06/29/2018 1555   UROBILINOGEN 0.2 01/23/2012 1445   NITRITE NEGATIVE 06/29/2018 1555   LEUKOCYTESUR NEGATIVE 06/29/2018 1555   Sepsis Labs: @LABRCNTIP (procalcitonin:4,lacticidven:4)  ) Recent Results (from the past 240 hour(s))  Surgical PCR screen     Status: None   Collection Time: 09/08/18 12:23 AM  Result Value Ref Range Status   MRSA, PCR NEGATIVE NEGATIVE Final   Staphylococcus aureus NEGATIVE NEGATIVE Final    Comment: (NOTE) The Xpert SA Assay (FDA approved for NASAL specimens in patients 79 years of age and older), is one component of a comprehensive surveillance  program. It is not intended to diagnose infection nor to guide or monitor treatment. Performed at [facility], [place] [address], [place], [place] [address]       Studies: No results found.  Scheduled Meds: . donepezil  10 mg Oral QHS  . DULoxetine  60 mg Oral Daily  . folic acid  1 mg Oral Daily  .  HYDROmorphone (DILAUDID) injection  1 mg Intravenous Once  . mouth rinse  15 mL Mouth Rinse BID  . metoprolol succinate  25 mg Oral Daily  . naloxegol oxalate  12.5 mg Oral Daily  . oxyCODONE  20 mg Oral BID  . potassium chloride SA  20 mEq Oral Daily  . sacubitril-valsartan  1 tablet Oral BID  . thiamine  100 mg Oral Daily  . traZODone  50 mg Oral QHS  . [START ON 09/10/2018] Vitamin D (Ergocalciferol)  50,000 Units Oral Weekly    Continuous Infusions: . lactated ringers 75 mL/hr at 09/09/18 0626  . methocarbamol (ROBAXIN) IV       LOS: 2 days     Kayleen Memos, MD [facility] Pager [phone]  If 7PM-7AM, please  contact night-coverage [email or web address] Password [facility] 09/09/2018, 3:19 PM

## 2018-09-09 NOTE — Anesthesia Preprocedure Evaluation (Addendum)
Anesthesia Evaluation  Patient identified by MRN, date of birth, ID band Patient awake and Patient confused    Reviewed: Allergy & Precautions, NPO status , Patient's Chart, lab work & pertinent test results  Airway Mallampati: II  TM Distance: >3 FB Neck ROM: Full    Dental  (+) Dental Advisory Given   Pulmonary former smoker,    breath sounds clear to auscultation       Cardiovascular negative cardio ROS   Rhythm:Regular Rate:Normal     Neuro/Psych Dementia CVA    GI/Hepatic Neg liver ROS, GERD  Medicated,  Endo/Other  negative endocrine ROS  Renal/GU negative Renal ROS     Musculoskeletal   Abdominal   Peds  Hematology negative hematology ROS (+)   Anesthesia Other Findings   Reproductive/Obstetrics                            Lab Results  Component Value Date   WBC 13.1 (H) 09/09/2018   HGB 10.4 (L) 09/09/2018   HCT 34.6 (L) 09/09/2018   MCV 103.6 (H) 09/09/2018   PLT 314 09/09/2018   Lab Results  Component Value Date   CREATININE 0.72 09/08/2018   BUN 18 09/08/2018   NA 135 09/08/2018   K 3.9 09/08/2018   CL 99 09/08/2018   CO2 28 09/08/2018    Anesthesia Physical Anesthesia Plan  ASA: III  Anesthesia Plan: General   Post-op Pain Management:    Induction: Intravenous  PONV Risk Score and Plan: 3 and Dexamethasone, Ondansetron and Treatment may vary due to age or medical condition  Airway Management Planned: Oral ETT  Additional Equipment:   Intra-op Plan:   Post-operative Plan: Extubation in OR  Informed Consent: I have reviewed the patients History and Physical, chart, labs and discussed the procedure including the risks, benefits and alternatives for the proposed anesthesia with the patient or authorized representative who has indicated his/her understanding and acceptance.     Dental advisory given and Consent reviewed with POA  Plan Discussed with:  CRNA  Anesthesia Plan Comments:        Anesthesia Quick Evaluation

## 2018-09-09 NOTE — Transfer of Care (Signed)
Immediate Anesthesia Transfer of Care Note  Patient: Yolanda [name]  Procedure(s) Performed: INTRAMEDULLARY (IM) NAIL FEMORAL (Left Hip)  Patient Location: PACU  Anesthesia Type:General  Level of Consciousness: awake, alert  and patient cooperative  Airway & Oxygen Therapy: Patient Spontanous Breathing and Patient connected to face mask oxygen  Post-op Assessment: Report given to RN and Post -op Vital signs reviewed and stable  Post vital signs: Reviewed and stable  Last Vitals:  Vitals Value Taken Time  BP 139/90 09/09/2018  7:33 PM  Temp    Pulse 86 09/09/2018  7:38 PM  Resp 23 09/09/2018  7:38 PM  SpO2 100 % 09/09/2018  7:38 PM  Vitals shown include unvalidated device data.  Last Pain:  Vitals:   09/09/18 1502  TempSrc:   PainSc: Asleep      Patients Stated Pain Goal: 1 (18/29/93 7169)  Complications: No apparent anesthesia complications

## 2018-09-09 NOTE — Progress Notes (Signed)
Patient off the floor to surgery via bed.

## 2018-09-09 NOTE — Brief Op Note (Signed)
09/09/2018  7:23 PM  PATIENT:  Yolanda [name]  85 y.o. female  PRE-OPERATIVE DIAGNOSIS:  left intertrochanteric/subtrochanteric femur fracture  POST-OPERATIVE DIAGNOSIS:  intertrochanteric/subtrochanteric femur fracture  PROCEDURE:  Procedure(s) with comments: INTRAMEDULLARY (IM) NAIL FEMORAL (Left) - 60 minutes Ok per [place] to follow himself  SURGEON:  Surgeon(s) and Role:    * Gaynelle Arabian, MD - Primary  PHYSICIAN ASSISTANT:   ASSISTANTS: Griffith Citron, PA-C   ANESTHESIA:   general  EBL:  50 mL   BLOOD ADMINISTERED:none  DRAINS: none   LOCAL MEDICATIONS USED:  NONE  COUNTS:  YES  TOURNIQUET:  * No tourniquets in log *  DICTATION: .Other Dictation: Dictation Number [id number]  PLAN OF CARE: Admit to inpatient   PATIENT DISPOSITION:  PACU - hemodynamically stable.

## 2018-09-10 LAB — CBC
HCT: 23.6 % — ABNORMAL LOW (ref 36.0–46.0)
Hemoglobin: 7.4 g/dL — ABNORMAL LOW (ref 12.0–15.0)
MCH: 30.8 pg (ref 26.0–34.0)
MCHC: 31.4 g/dL (ref 30.0–36.0)
MCV: 98.3 fL (ref 80.0–100.0)
Platelets: 299 10*3/uL (ref 150–400)
RBC: 2.4 MIL/uL — ABNORMAL LOW (ref 3.87–5.11)
RDW: 13.6 % (ref 11.5–15.5)
WBC: 13 10*3/uL — ABNORMAL HIGH (ref 4.0–10.5)
nRBC: 0 % (ref 0.0–0.2)

## 2018-09-10 LAB — BASIC METABOLIC PANEL
Anion gap: 5 (ref 5–15)
BUN: 17 mg/dL (ref 8–23)
CALCIUM: 8.9 mg/dL (ref 8.9–10.3)
CO2: 32 mmol/L (ref 22–32)
Chloride: 101 mmol/L (ref 98–111)
Creatinine, Ser: 0.76 mg/dL (ref 0.44–1.00)
GFR calc Af Amer: >60 mL/min (ref >60)
GFR calc non Af Amer: >60 mL/min (ref >60)
Glucose, Bld: 159 mg/dL — ABNORMAL HIGH (ref 70–99)
Potassium: 5 mmol/L (ref 3.5–5.1)
Sodium: 138 mmol/L (ref 135–145)

## 2018-09-10 LAB — HEMOGLOBIN AND HEMATOCRIT, BLOOD
HCT: 22.3 % — ABNORMAL LOW (ref 36.0–46.0)
Hemoglobin: 6.6 g/dL — CL (ref 12.0–15.0)

## 2018-09-10 LAB — PREPARE RBC (CROSSMATCH)

## 2018-09-10 MED ORDER — FUROSEMIDE 10 MG/ML IJ SOLN
20.0000 mg | Freq: Once | INTRAMUSCULAR | Status: AC
  Administered 2018-09-10: 20 mg via INTRAVENOUS
  Filled 2018-09-10: qty 2

## 2018-09-10 MED ORDER — HYDROCODONE-ACETAMINOPHEN 5-325 MG PO TABS: 1.0000 | ORAL_TABLET | Freq: Four times a day (QID) | ORAL | 0 refills | Status: DC | PRN

## 2018-09-10 MED ORDER — ENOXAPARIN SODIUM 30 MG/0.3ML ~~LOC~~ SOLN: 30.0000 mg | SUBCUTANEOUS | 0 refills | Status: DC

## 2018-09-10 MED ORDER — ENOXAPARIN SODIUM 30 MG/0.3ML ~~LOC~~ SOLN: 30.0000 mg | SUBCUTANEOUS | 0 refills | Status: AC

## 2018-09-10 MED ORDER — METHOCARBAMOL 500 MG PO TABS: 500.0000 mg | ORAL_TABLET | Freq: Four times a day (QID) | ORAL | 0 refills | Status: DC | PRN

## 2018-09-10 MED ORDER — HYDROCODONE-ACETAMINOPHEN 5-325 MG PO TABS: 1.0000 | ORAL_TABLET | Freq: Four times a day (QID) | ORAL | 0 refills | Status: AC | PRN

## 2018-09-10 MED ORDER — METHOCARBAMOL 500 MG PO TABS: 500.0000 mg | ORAL_TABLET | Freq: Four times a day (QID) | ORAL | 0 refills | Status: AC | PRN

## 2018-09-10 MED ORDER — SODIUM CHLORIDE 0.9% IV SOLUTION
Freq: Once | INTRAVENOUS | Status: AC
  Administered 2018-09-11: 19:00:00 via INTRAVENOUS

## 2018-09-10 NOTE — Progress Notes (Signed)
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  Apparently, patient had a prolonged stay at a skilled nursing facility following discharge from the hospital after treatment for congestive heart failure.  Patient was reported to have fallen on the fourth of arrival back at the assisted living facility with subsequent closed left hip fracture.  Orthopedic team has been consulted.  Cardiology team has also been consulted as the patient has extensive cardiac history.  09/09/2018: Patient seen and examined at bedside.  No acute events overnight.  She has no new complaints.  Plan for surgery today.   09/10/2018: Patient seen and examined with her personal sitter at bedside.  Somnolent but easily arousable to voices.  Hemoglobin dropped this morning to 6.6.  No sign of overt bleeding.  Will transfuse 2 unit PRBCs.    Assessment/Plan: Active Problems:   GERD   Dementia without behavioral disturbance (HCC)   Takotsubo cardiomyopathy   Closed left hip fracture (HCC)  Closed left hip fracture, POD #1 post repair -Orthopedic team following.  Surgical repair on 09/09/2018 -Continue pain management -Plan to continue physical therapy at SNF  Acute blood loss anemia post left hip repair No overt sign of bleeding Hemoglobin dropped to 6.6 Transfused 2 units PRBCs Repeat CBC in the morning  History of Takotsubo cardiomyopathy: -No acute symptoms of CHF. -On beta-blocker, Entresto and spironolactone. -Repeat 2D echo done on 09/08/2018 revealed normal LVEF 60 to 65% with grade 1 diastolic  dysfunction. -Continue cardiac medications as recommended by cardiology  Chronic diastolic CHF Last 2D echo done on 09/08/2018 revealed LVEF 60 to 65% with grade 1 diastolic dysfunction Continue strict I's and O's and daily weight Give 1 dose of 20 mg of IV Lasix posttransfusion to avoid pulmonary edema.  Dementia without behavioral problems:  Patient is on donepezil.   Continue to manage expectantly.    Patient history of chronic pain: Optimize pain control.    DVT prophylaxis:SCDs  Code Status:FULL CODE as per family  Family Communication:None at bedside.  Her personal sitter was present at the time of this visit. Disposition Plan:  Possible SNF for physical rehab after surgerytomorrow 09/11/2018 if hemodynamically stable  Consults: Orthopedics and cardiology.    Procedures:  Left hip surgical repair on 09/09/2018  Antimicrobials:   None    Objective: Vitals:   09/10/18 0517 09/10/18 1143 09/10/18 1355 09/10/18 1411  BP: (!) 94/53 (!) 95/44 (!) 98/46 (!) 104/51  Pulse: 84 90 88 92  Resp: 16  20 20   Temp: 98 F (36.7 C) 98.2 F (36.8 C) 98.4 F (36.9 C) 98.5 F (36.9 C)  TempSrc: Oral Oral  Axillary  SpO2: 100% 100% 100% 100%  Height:        Intake/Output Summary (Last 24 hours) at 09/10/2018 1620 Last data filed at 09/10/2018 1159 Gross per 24 hour  Intake 2252.29 ml  Output 1160 ml  Net 1092.29 ml   There were no vitals filed for this visit.  Exam:  . General: 85 y.o. year-old female well-developed well-nourished in no acute distress.  Somnolent but easily arousable to voices.   . Cardiovascular: Regular rate and rhythm with  no rubs or gallops.  No JVD or thyromegaly noted. Marland Kitchen Respiratory: Clear to auscultation with no wheezes or rales.  Poor inspiratory effort. . Abdomen: Soft nontender nondistended with normal bowel sounds x4 quadrants. . Musculoskeletal: Trace lower extremity edema.  Area of entry from surgery appears  mildly edematous.  No obvious bruises noted.  2/4 pulses in all 4 extremities. Marland Kitchen Psychiatry: Mood is appropriate for condition and setting   Data Reviewed: CBC: Recent Labs  Lab 09/07/18 2111 09/08/18 0510 09/09/18 0522 09/10/18 0641 09/10/18 0950  WBC 11.6* 13.1* 13.1* 13.0*  --   NEUTROABS 8.4*  --  9.6*  --   --   HGB 11.2* 10.2* 10.4* 7.4* 6.6*  HCT 36.2 32.9* 34.6* 23.6* 22.3*  MCV 99.5 99.4 103.6* 98.3  --   PLT 369 341 314 299  --    Basic Metabolic Panel: Recent Labs  Lab 09/07/18 2111 09/08/18 0510 09/10/18 0641  NA 136 135 138  K 4.2 3.9 5.0  CL 99 99 101  CO2 30 28 32  GLUCOSE 116* 108* 159*  BUN 17 18 17   CREATININE 0.80 0.72 0.76  CALCIUM 9.7 9.6 8.9   GFR: Estimated Creatinine Clearance: 43.7 mL/min (by C-G formula based on SCr of 0.76 mg/dL). Liver Function Tests: Recent Labs  Lab 09/08/18 0510  ALBUMIN 3.1*   No results for input(s): LIPASE, AMYLASE in the last 168 hours. No results for input(s): AMMONIA in the last 168 hours. Coagulation Profile: Recent Labs  Lab 09/07/18 2111  INR 0.91   Cardiac Enzymes: Recent Labs  Lab 09/07/18 2111  TROPONINI <0.03   BNP (last 3 results) No results for input(s): PROBNP in the last 8760 hours. HbA1C: No results for input(s): HGBA1C in the last 72 hours. CBG: Recent Labs  Lab 09/09/18 0737  GLUCAP 107*   Lipid Profile: No results for input(s): CHOL, HDL, LDLCALC, TRIG, CHOLHDL, LDLDIRECT in the last 72 hours. Thyroid Function Tests: No results for input(s): TSH, T4TOTAL, FREET4, T3FREE, THYROIDAB in the last 72 hours. Anemia Panel: No results for input(s): VITAMINB12, FOLATE, FERRITIN, TIBC, IRON, RETICCTPCT in the last 72 hours. Urine analysis:    Component Value Date/Time   COLORURINE AMBER (A) 06/29/2018 1555   APPEARANCEUR CLEAR 06/29/2018 1555   LABSPEC 1.026 06/29/2018 1555   PHURINE 5.0 06/29/2018 1555   GLUCOSEU NEGATIVE 06/29/2018 1555   HGBUR NEGATIVE 06/29/2018 [place] 06/29/2018 1555   BILIRUBINUR neg 01/23/2012 1445   KETONESUR 20 (A) 06/29/2018 1555   PROTEINUR 30 (A) 06/29/2018 1555   UROBILINOGEN 0.2 01/23/2012 1445   NITRITE NEGATIVE 06/29/2018 1555   LEUKOCYTESUR NEGATIVE 06/29/2018 1555   Sepsis Labs: @LABRCNTIP (procalcitonin:4,lacticidven:4)  ) Recent Results (from the past 240 hour(s))  Surgical PCR screen     Status: None   Collection Time: 09/08/18 12:23 AM  Result Value Ref Range Status   MRSA, PCR NEGATIVE NEGATIVE Final   Staphylococcus aureus NEGATIVE NEGATIVE Final    Comment: (NOTE) The Xpert SA Assay (FDA approved for NASAL specimens in patients 63 years of age and older), is one component of a comprehensive surveillance program. It is not intended to diagnose infection nor to guide or monitor treatment. Performed at [facility], [place] [address] [place],  [address]       Studies: Dg C-arm 1-60 Min-no Report  Result Date: 09/09/2018 Fluoroscopy was utilized by the requesting physician.  No radiographic interpretation.   Dg Femur Min 2 Views Left  Result  Date: 09/09/2018 CLINICAL DATA:  85 year old female with ORIF of the left femoral fracture. EXAM: LEFT FEMUR 2 VIEWS COMPARISON:  Left hip radiograph dated 09/07/2018 FINDINGS: Open reduction internal fixation of left femoral fracture with placement of an intramedullary rod and two transcervical screws. The orthopedic hardware appears intact. IMPRESSION: Open reduction internal fixation of left femoral fracture. Intact hardware. Electronically Signed   By: Anner Crete M.D.   On: 09/09/2018 19:34    Scheduled Meds: . sodium chloride   Intravenous Once  . docusate sodium  100 mg Oral BID  . donepezil  10 mg Oral QHS  . DULoxetine  60 mg Oral Daily  . enoxaparin (LOVENOX) injection  30 mg Subcutaneous Q24H  . folic acid  1 mg Oral Daily  . mouth rinse  15 mL Mouth Rinse BID  . metoprolol succinate  25 mg Oral Daily  .  naloxegol oxalate  12.5 mg Oral Daily  . oxyCODONE  20 mg Oral BID  . potassium chloride SA  20 mEq Oral Daily  . sacubitril-valsartan  1 tablet Oral BID  . thiamine  100 mg Oral Daily  . traZODone  50 mg Oral QHS  . Vitamin D (Ergocalciferol)  50,000 Units Oral Weekly    Continuous Infusions: . sodium chloride 75 mL/hr at 09/10/18 1131  . lactated ringers    . methocarbamol (ROBAXIN) IV       LOS: 3 days     Kayleen Memos, MD [facility] Pager [phone]  If 7PM-7AM, please contact night-coverage [email or web address] Password [email or web address] 09/10/2018, 4:20 PM

## 2018-09-10 NOTE — Progress Notes (Signed)
Per night RN patient tolerated clear liquids throughout the night.  Diet advanced to full liquid this morning.

## 2018-09-10 NOTE — Progress Notes (Signed)
  09/10/2018 1 Day Post-Op Procedure(s) (LRB): INTRAMEDULLARY (IM) NAIL FEMORAL (Left) Patient reports pain as moderate.   Patient seen in rounds by Dr. Wynelle Link. Patient is well, and has had no acute complaints or problems other than pain in the left hip. No issues overnight. They will be PWB to the left (25%).  Vital signs in last 24 hours: Temp:  [97.5 F (36.4 C)-98.8 F (37.1 C)] 98 F (36.7 C) (01/23 0517) Pulse Rate:  [78-97] 84 (01/23 0517) Resp:  [15-26] 16 (01/23 0517) BP: (90-139)/(41-90) 94/53 (01/23 0517) SpO2:  [86 %-100 %] 100 % (01/23 0517)  I&O's: I/O last 3 completed shifts: In: 3838.7 [P.O.:500; I.V.:2923.7; IV Piggyback:415] Out: 1860 [Urine:1810; Blood:50] No intake/output data recorded.  Labs: Recent Labs    09/07/18 2111 09/08/18 0510 09/09/18 0522  HGB 11.2* 10.2* 10.4*   Recent Labs    09/08/18 0510 09/09/18 0522  WBC 13.1* 13.1*  RBC 3.31* 3.34*  HCT 32.9* 34.6*  PLT 341 314   Recent Labs    09/08/18 0510 09/10/18 0641  NA 135 138  K 3.9 5.0  CL 99 101  CO2 28 32  BUN 18 17  CREATININE 0.72 0.76  GLUCOSE 108* 159*  CALCIUM 9.6 8.9   Recent Labs    09/07/18 2111  INR 0.91    Exam: General - Patient is Alert Extremity - Neurologically intact Neurovascular intact Sensation intact distally Dorsiflexion/Plantar flexion intact Dressing - dressing C/D/I Motor Function - intact, moving foot and toes well on exam.   Past Medical History:  Diagnosis Date  . Adenomatous colon polyp    tubular  . Anxiety disorder    panic attacks, PMH of. Dr  Janna Arch  . 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 Diverticulosis   . Empyema ([place])    Left   . Fibroids    Lupron shots  . GERD (gastroesophageal reflux disease)   . High altitude sickness 2008  . Premature menopause    due to Lupron    Assessment/Plan: 1 Day Post-Op Procedure(s) (LRB): INTRAMEDULLARY (IM) NAIL FEMORAL (Left) Active  Problems:   GERD   Dementia without behavioral disturbance (HCC)   Takotsubo cardiomyopathy   Closed left hip fracture (HCC)  Estimated body mass index is 19.93 kg/m as calculated from the following:   Height as of this encounter: 5\' 3"  (1.6 m).   Weight as of 08/31/18: 51 kg. Up with therapy  DVT Prophylaxis - Lovenox Partial weight bearing to the LLE (25%)  Disposition will be per medical team.  Theresa Duty, PA-C Orthopedic Surgery 09/10/2018, 7:59 AM

## 2018-09-10 NOTE — Progress Notes (Signed)
PT Cancellation Note  Patient Details Name: DAVYN ELSASSER MRN: [id number] DOB: 06-04-1934   Cancelled Treatment:    Reason Eval/Treat Not Completed: Medical issues which prohibited therapy--Hgb 6.6. Will hold PT today and attempt evaluation on tomorrow.    Weston Anna, PT Acute Rehabilitation Services Pager: [phone] Office: [phone]

## 2018-09-10 NOTE — Progress Notes (Signed)
  Speech Language Pathology Treatment: Dysphagia  Patient Details Name: Yolanda [name] MRN: [id number] DOB: 07/19/1934 Today's Date: 09/10/2018 Time: 1218-1225 SLP Time Calculation (min) (ACUTE ONLY): 7 min  Assessment / Plan / Recommendation Clinical Impression  Pt partially reclined due to pain in left hip s/p surgery yesterday. Initially hesitant to raise head of bed but allowed SLP to slowly raise and use reverse Trendelenburg position during trials water via straw and applesauce. No s/s aspiration and ST has seen pt during this and previous visits where she has safely consumed regular/ thin textures. Pt is on full liquids post surgery and MD can upgrade texture when appropriate. Pt does not require further ST.   HPI HPI: 85 yo female resident of ILF adm to [facility] after fall - found to have closed left hip fx.  Now for surgery 1/22 at approx 830 pm per RN report.  Pt with PMH + for breast cancer s/p double masectomy - denies radiation or chemo, Takotsubo CM, CHF EF 35%, anxiety, depression, dementia, pna 06/2018, thoracentesis 2018, stroke impacting vision and left side of body (stroke was cerebellum, right thalamus and occipital region).  Swallow eval ordered.  Pt did undergo MBS in November 2019 with findings of near normal swallow.  Swallow evaluation ordered during this hospital admit.        SLP Plan  All goals met;Discharge SLP treatment due to (comment)       Recommendations  Diet recommendations: (full liquids per MD; regular/thin) Liquids provided via: Cup;Straw Medication Administration: Whole meds with liquid Supervision: Patient able to self feed Compensations: Slow rate;Small sips/bites Postural Changes and/or Swallow Maneuvers: Seated upright 90 degrees                Oral Care Recommendations: Oral care BID Follow up Recommendations: None SLP Visit Diagnosis: Dysphagia, unspecified (R13.10) Plan: All goals met;Discharge SLP treatment due to (comment)        GO                Houston Siren 09/10/2018, 1:02 PM   Orbie Pyo Colvin Caroli.Ed Risk analyst [phone] Office [phone]

## 2018-09-10 NOTE — Op Note (Signed)
NAME: Yolanda [name], SEEBECK MEDICAL RECORD [id number] ACCOUNT [id number] DATE OF BIRTH:May 23, 1934 FACILITY: [facility] LOCATION: [facility] PHYSICIAN:Adamarie Izzo Zella Ball, MD  OPERATIVE REPORT  DATE OF PROCEDURE:  09/09/2018  PREOPERATIVE DIAGNOSIS:  Left intertrochanteric/subtrochanteric femur fracture.  POSTOPERATIVE DIAGNOSIS:  Left intertrochanteric/subtrochanteric femur fracture.  PROCEDURE:  Intramedullary nailing, left femur.  SURGEON:  Gaynelle Arabian, MD  ASSISTANT:  Griffith Citron, PA-C  ANESTHESIA:  General.  ESTIMATED BLOOD LOSS:  50 mL.  DRAINS:  None.  COMPLICATIONS:  None.  CONDITION:  Stable to recovery.  BRIEF CLINICAL NOTE:  The patient is an 85 year old female who had a fall 2 days ago at home leading to a significantly comminuted and displaced intertrochanteric/subtrochanteric femur fracture.  She has been cleared medically and by cardiology and  presents now for operative fixation of this fracture.  PROCEDURE IN DETAIL:  After successful administration of general anesthetic, the patient was placed on the Hana bed and her left foot placed into a low-padded boot.  Left lower extremity was placed into the traction device and the right lower extremity  placed in a well leg holder.  Traction was applied with internal rotation to show under fluoroscopic guidance that the fracture was out to length.  It was grossly comminuted proximally, and alignment was found to be acceptable.  The left thigh was then  prepped and draped in the usual sterile fashion.  The guide pin for the [employer] long trochanteric nail was then passed percutaneously to enter at the tip of the greater trochanter.  It was then passed into the femoral canal proximally.  A small  incision was made around this, and the starter reamer was then passed over the guide pin.  The guide pin was then removed.  Given the comminution of this fracture, I decided to use a long trochanteric nail.  The long guide pin was then  passed through the  entry point of the proximal femur across the fracture site and down distally to the physeal scar of the femur.  The length is 360 mm.  We reamed over this up to 13 mm for placement of an 11 x 360 nail.  The intramedullary nail was then hooked to the  external guide and passed over the guide wire down into the femoral canal.  The guide wire was then removed.  The lag screw was then performed by making a small incision, passing the guide for the 130-degree nail, and then the guide pin was passed such  that it was in the center of the femoral head and neck on AP and lateral views.  The length is 100 mm.  Triple reamer was passed over the guide pin, and then a 100 mm lag screw was placed.  Given the comminution of the fracture, I decided to also place  an anti-rotation screw, and this was 90 mm in length and is just parallel and proximal to the lag screw.  Both were found to be in good position, and the fracture reduction was acceptable given the degree of comminution.  The rotation was then locked  proximally on the nail.  The external guide was then removed.  Multiple views were taken in AP and lateral with fluoroscopy showing good reduction of this fracture.  We then placed the distal interlocks via the free-hand technique.  Small incisions were made under fluoroscopic guidance.  The holes were drilled through the holes in the nail, and then the appropriate length cortical screws were placed with good  purchase and were shown to  go through the nail on the lateral view and to be appropriate length on AP view.  The wound was then copiously irrigated with saline solution, and the proximal incision was closed deep with #1 Vicryl, subcu with interrupted 2-0  Vicryl, and skin with staples.  Interlock incisions were closed with staples.  Incisions were cleaned and dried and a bulky sterile dressing applied.  She was then awakened and transported to recovery in stable condition.  Note that a  surgical assistant was a medical necessity for this procedure as this was a very complex fracture reduction and fracture fixation and could not be performed successfully without the help of a surgical assistant.  LN/NUANCE  D:09/09/2018 T:09/10/2018 JOB:[id number]

## 2018-09-10 NOTE — Progress Notes (Signed)
CRITICAL VALUE ALERT  Critical Value:  Hemoglobin 6/6  Date & Time Notied:  09/10/2018  Provider Notified: Dr. Nevada Crane  Orders Received/Actions taken: Order to be placed to transfuse PRBC

## 2018-09-11 ENCOUNTER — Encounter (HOSPITAL_COMMUNITY): Payer: Self-pay | Admitting: Orthopedic Surgery

## 2018-09-11 LAB — TYPE AND SCREEN
ABO/RH(D): O POS
ANTIBODY SCREEN: NEGATIVE
UNIT DIVISION: 0
Unit division: 0

## 2018-09-11 LAB — BASIC METABOLIC PANEL
Anion gap: 7 (ref 5–15)
BUN: 14 mg/dL (ref 8–23)
CO2: 30 mmol/L (ref 22–32)
CREATININE: 0.66 mg/dL (ref 0.44–1.00)
Calcium: 9.2 mg/dL (ref 8.9–10.3)
Chloride: 100 mmol/L (ref 98–111)
GFR calc Af Amer: >60 mL/min (ref >60)
GFR calc non Af Amer: >60 mL/min (ref >60)
GLUCOSE: 106 mg/dL — AB (ref 70–99)
Potassium: 4 mmol/L (ref 3.5–5.1)
Sodium: 137 mmol/L (ref 135–145)

## 2018-09-11 LAB — CBC
HCT: 25.4 % — ABNORMAL LOW (ref 36.0–46.0)
Hemoglobin: 8 g/dL — ABNORMAL LOW (ref 12.0–15.0)
MCH: 31.4 pg (ref 26.0–34.0)
MCHC: 31.5 g/dL (ref 30.0–36.0)
MCV: 99.6 fL (ref 80.0–100.0)
NRBC: 0 % (ref 0.0–0.2)
Platelets: 221 10*3/uL (ref 150–400)
RBC: 2.55 MIL/uL — ABNORMAL LOW (ref 3.87–5.11)
RDW: 14.6 % (ref 11.5–15.5)
WBC: 12.1 10*3/uL — ABNORMAL HIGH (ref 4.0–10.5)

## 2018-09-11 LAB — BPAM RBC
BLOOD PRODUCT EXPIRATION DATE: 202002252359
Blood Product Expiration Date: 202002252359
ISSUE DATE / TIME: 202001231345
UNIT TYPE AND RH: 5100
Unit Type and Rh: 5100

## 2018-09-11 MED ORDER — LORAZEPAM 0.5 MG PO TABS: 0.5000 mg | ORAL_TABLET | Freq: Three times a day (TID) | ORAL | 0 refills | Status: AC | PRN

## 2018-09-11 MED ORDER — OXYCODONE HCL ER 20 MG PO T12A: 20.0000 mg | EXTENDED_RELEASE_TABLET | Freq: Two times a day (BID) | ORAL | 0 refills | Status: AC

## 2018-09-11 MED ORDER — SENNOSIDES-DOCUSATE SODIUM 8.6-50 MG PO TABS: 2.0000 | ORAL_TABLET | Freq: Two times a day (BID) | ORAL | 0 refills | Status: AC

## 2018-09-11 MED ORDER — POLYETHYLENE GLYCOL 3350 17 G PO PACK
17.0000 g | PACK | Freq: Every day | ORAL | Status: DC
  Administered 2018-09-12: 17 g via ORAL
  Filled 2018-09-11 (×2): qty 1

## 2018-09-11 MED ORDER — POLYETHYLENE GLYCOL 3350 17 G PO PACK: 17.0000 g | PACK | Freq: Every day | ORAL | 0 refills | Status: AC

## 2018-09-11 MED ORDER — SENNOSIDES-DOCUSATE SODIUM 8.6-50 MG PO TABS
2.0000 | ORAL_TABLET | Freq: Two times a day (BID) | ORAL | Status: DC
  Administered 2018-09-11 – 2018-09-12 (×2): 2 via ORAL
  Filled 2018-09-11 (×3): qty 2

## 2018-09-11 NOTE — Evaluation (Signed)
Physical Therapy Evaluation Patient Details Name: Yolanda [name] MRN: [id number] DOB: 10/14/1933 Today's Date: 09/11/2018   History of Present Illness  85 yo female s/p L femur IM nailing 09/09/18. Hx of dementia, CVA, Takotsubo CM, CHF, breast ca, ETOH abuse, anxiety. Pt is from [employer] [facility].     Clinical Impression  On eva, pt required Total assist +2 for mobility. Pt was able to sit EOB for ~3 minutes with varying level of assistance (Mod A-Max A). Unable to attempt standing or transfer on today due to weakness and pain. Family was initially present but left at start of session. Pt will need SNF for continued rehab. Will follow during hospital stay.     Follow Up Recommendations SNF    Equipment Recommendations  None recommended by PT    Recommendations for Other Services       Precautions / Restrictions Precautions Precautions: Fall Restrictions Weight Bearing Restrictions: Yes LLE Weight Bearing: Partial weight bearing LLE Partial Weight Bearing Percentage or Pounds: 25%      Mobility  Bed Mobility Overal bed mobility: Needs Assistance Bed Mobility: Supine to Sit;Sit to Supine     Supine to sit: Total assist;+2 for physical assistance;+2 for safety/equipment;HOB elevated Sit to supine: Total assist;+2 for physical assistance;+2 for safety/equipment;HOB elevated   General bed mobility comments: Assist for trunk and bil LEs. Utilized bedpad for scooting, positioning. Multimodal cueing required.   Transfers                 General transfer comment: NT-pt is currently too weak and in too much pain to attempt  Ambulation/Gait                Stairs            Wheelchair Mobility    Modified Rankin (Stroke Patients Only)       Balance Overall balance assessment: Needs assistance   Sitting balance-Leahy Scale: Poor Sitting balance - Comments: As time progressed, pt able to sit EOB with Mod assist for static sitting balance. Skilled  cueing and assist for anterior weightshifting Postural control: Posterior lean                                   Pertinent Vitals/Pain Pain Assessment: Faces Faces Pain Scale: Hurts whole lot Pain Location: L LE Pain Descriptors / Indicators: Moaning;Discomfort;Grimacing;Guarding;Sharp Pain Intervention(s): Limited activity within patient's tolerance;Repositioned    Home Living Family/patient expects to be discharged to:: Skilled nursing facility     Type of Home: Independent living facility([facility])         Home Equipment: Walker - 4 wheels;Wheelchair - manual Additional Comments: from [facility]- has 24/7 caregiver    Prior Function Level of Independence: Needs assistance   Gait / Transfers Assistance Needed: uses Rollator with assistance           Hand Dominance        Extremity/Trunk Assessment   Upper Extremity Assessment Upper Extremity Assessment: Generalized weakness    Lower Extremity Assessment Lower Extremity Assessment: Generalized weakness;LLE deficits/detail LLE: Unable to fully assess due to pain    Cervical / Trunk Assessment Cervical / Trunk Assessment: Kyphotic  Communication   Communication: HOH  Cognition Arousal/Alertness: Awake/alert Behavior During Therapy: WFL for tasks assessed/performed Overall Cognitive Status: History of cognitive impairments - at baseline  General Comments      Exercises     Assessment/Plan    PT Assessment Patient needs continued PT services  PT Problem List Decreased strength;Decreased balance;Decreased range of motion;Decreased mobility;Decreased activity tolerance;Pain;Decreased knowledge of use of DME;Decreased cognition       PT Treatment Interventions DME instruction;Functional mobility training;Gait training;Therapeutic activities;Therapeutic exercise;Balance training;Patient/family education    PT Goals (Current goals  can be found in the Care Plan section)  Acute Rehab PT Goals Patient Stated Goal: less pain.  PT Goal Formulation: With patient Time For Goal Achievement: 09/25/18 Potential to Achieve Goals: Fair    Frequency Min 2X/week   Barriers to discharge        Co-evaluation               AM-PAC PT "6 Clicks" Mobility  Outcome Measure Help needed turning from your back to your side while in a flat bed without using bedrails?: Total Help needed moving from lying on your back to sitting on the side of a flat bed without using bedrails?: Total Help needed moving to and from a bed to a chair (including a wheelchair)?: Total Help needed standing up from a chair using your arms (e.g., wheelchair or bedside chair)?: Total Help needed to walk in hospital room?: Total Help needed climbing 3-5 steps with a railing? : Total 6 Click Score: 6    End of Session   Activity Tolerance: Patient limited by pain Patient left: in bed;with call bell/phone within reach;with bed alarm set   PT Visit Diagnosis: Muscle weakness (generalized) (M62.81);Pain;Other abnormalities of gait and mobility (R26.89);History of falling (Z91.81);Difficulty in walking, not elsewhere classified (R26.2) Pain - Right/Left: Left Pain - part of body: Leg    Time: 6546-5035 PT Time Calculation (min) (ACUTE ONLY): 16 min   Charges:   PT Evaluation $PT Eval Moderate Complexity: [place], PT Acute Rehabilitation Services Pager: [phone] Office: [phone]

## 2018-09-11 NOTE — NC FL2 (Signed)
[place] MEDICAID FL2 LEVEL OF CARE SCREENING TOOL     IDENTIFICATION  Patient Name: Yolanda [name] Birthdate: 01/20/1934 Sex: female Admission Date (Current Location): 09/07/2018  [facility] and [place] Number:  Herbalist and Address:  [facility],  [place], [place]      Provider Number: [id number]  Attending Physician Name and Address:  Kayleen Memos, DO  Relative Name and Phone Number:  Jacqualine Mau [phone]    Current Level of Care: Hospital Recommended Level of Care: [place] Prior Approval Number:    Date Approved/Denied:   PASRR Number: [id number]  Discharge Plan: SNF    Current Diagnoses: Patient Active Problem List   Diagnosis Date Noted  . Closed left hip fracture ([place]) 09/07/2018  . Takotsubo cardiomyopathy 07/05/2018  . Acute respiratory failure with hypoxia ([place])   . Pleural effusion   . Acute pulmonary edema (HCC)   . Dementia without behavioral disturbance ([place]) 06/29/2018  . Hypokalemia 06/29/2018  . HCAP (healthcare-associated pneumonia)   . Sepsis due to pneumonia ([place])   . Empyema lung ([place])   . Pleural effusion on left   . Acute respiratory failure with hypoxemia ([place])   . Hypoxia   . Leukocytosis   . Severe protein-calorie malnutrition ([place])   . Acute bronchitis 02/07/2017  . ETOH abuse 02/07/2017  . Fall 02/07/2017  . Coccyx pain 02/07/2017  . Abnormal EKG 11/24/2015  . Pre-operative cardiovascular examination 11/24/2015  . Non-compliant behavior 06/23/2015  . ARTHRALGIA 05/14/2010  . SIGMOID POLYP 10/10/2009  . CARPAL TUNNEL SYNDROME 05/24/2009  . FASTING HYPERGLYCEMIA 05/24/2009  . ASTHMA 05/19/2008  . GERD 05/19/2008  . ELEVATED BLOOD PRESSURE WITHOUT DIAGNOSIS OF HYPERTENSION 05/19/2008  . TRIGGER FINGER, RIGHT MIDDLE 10/05/2007  . DUPUYTREN'S CONTRACTURE, RIGHT 10/05/2007    Orientation RESPIRATION BLADDER Height & Weight     Self  O2(3L) External  catheter, Continent Weight: 118 lb 6.4 oz (53.7 kg) Height:  5\' 3"  (160 cm)  BEHAVIORAL SYMPTOMS/MOOD NEUROLOGICAL BOWEL NUTRITION STATUS      Continent Diet(regular)  AMBULATORY STATUS COMMUNICATION OF NEEDS Skin   Extensive Assist Verbally Surgical wounds                       Personal Care Assistance Level of Assistance  Bathing, Feeding, Dressing Bathing Assistance: Maximum assistance Feeding assistance: Limited assistance Dressing Assistance: Maximum assistance     Functional Limitations Info  Sight, Hearing, Speech Sight Info: Adequate Hearing Info: Adequate Speech Info: Adequate    SPECIAL CARE FACTORS FREQUENCY  PT (By licensed PT), OT (By licensed OT)     PT Frequency: 5x wk OT Frequency: 5x wk            Contractures Contractures Info: Not present    Additional Factors Info  Allergies, Code Status Code Status Info: Full code Allergies Info: NKA           Current Medications (09/11/2018):  This is the current hospital active medication list Current Facility-Administered Medications  Medication Dose Route Frequency Provider Last Rate Last Dose  . 0.9 %  sodium chloride infusion (Manually program via Guardrails IV Fluids)   Intravenous Once [place], Carole N, DO      . 0.9 %  sodium chloride infusion   Intravenous Continuous Gaynelle Arabian, MD 75 mL/hr at 09/10/18 1131    . cyclobenzaprine (FLEXERIL) tablet 5 mg  5 mg Oral TID PRN Gaynelle Arabian, MD   5  mg at 09/11/18 0727  . docusate sodium (COLACE) capsule 100 mg  100 mg Oral BID Gaynelle Arabian, MD   100 mg at 09/11/18 0909  . donepezil (ARICEPT) tablet 10 mg  10 mg Oral QHS Gaynelle Arabian, MD   10 mg at 09/10/18 2055  . DULoxetine (CYMBALTA) DR capsule 60 mg  60 mg Oral Daily Gaynelle Arabian, MD   60 mg at 09/11/18 0909  . enoxaparin (LOVENOX) injection 30 mg  30 mg Subcutaneous Q24H [place], Carole N, DO   30 mg at 09/11/18 0908  . folic acid (FOLVITE) tablet 1 mg  1 mg Oral Daily Gaynelle Arabian, MD   1 mg  at 09/11/18 0909  . HYDROcodone-acetaminophen (NORCO/VICODIN) 5-325 MG per tablet 1-2 tablet  1-2 tablet Oral Q6H PRN Gaynelle Arabian, MD   2 tablet at 09/11/18 1419  . HYDROmorphone (DILAUDID) injection 0.5 mg  0.5 mg Intravenous Q2H PRN Gaynelle Arabian, MD   0.5 mg at 09/11/18 1135  . LORazepam (ATIVAN) tablet 0.5 mg  0.5 mg Oral Q6H PRN Gaynelle Arabian, MD   0.5 mg at 09/10/18 1713  . MEDLINE mouth rinse  15 mL Mouth Rinse BID Gaynelle Arabian, MD   15 mL at 09/11/18 1136  . menthol-cetylpyridinium (CEPACOL) lozenge 3 mg  1 lozenge Oral PRN Gaynelle Arabian, MD       Or  . phenol (CHLORASEPTIC) mouth spray 1 spray  1 spray Mouth/Throat PRN Aluisio, Pilar Plate, MD      . methocarbamol (ROBAXIN) tablet 500 mg  500 mg Oral Q6H PRN Gaynelle Arabian, MD   500 mg at 09/11/18 1235   Or  . methocarbamol (ROBAXIN) 500 mg in dextrose 5 % 50 mL IVPB  500 mg Intravenous Q6H PRN Aluisio, Pilar Plate, MD      . metoCLOPramide (REGLAN) tablet 5-10 mg  5-10 mg Oral Q8H PRN Gaynelle Arabian, MD       Or  . metoCLOPramide (REGLAN) injection 5-10 mg  5-10 mg Intravenous Q8H PRN Aluisio, Pilar Plate, MD      . metoprolol succinate (TOPROL-XL) 24 hr tablet 25 mg  25 mg Oral Daily Gaynelle Arabian, MD   25 mg at 09/11/18 0909  . naloxegol oxalate (MOVANTIK) tablet 12.5 mg  12.5 mg Oral Daily Gaynelle Arabian, MD   12.5 mg at 09/11/18 0908  . ondansetron (ZOFRAN) tablet 4 mg  4 mg Oral Q6H PRN Gaynelle Arabian, MD       Or  . ondansetron (ZOFRAN) injection 4 mg  4 mg Intravenous Q6H PRN Aluisio, Pilar Plate, MD      . oxyCODONE (OXYCONTIN) 12 hr tablet 20 mg  20 mg Oral BID Gaynelle Arabian, MD   20 mg at 09/11/18 0909  . polyethylene glycol (MIRALAX / GLYCOLAX) packet 17 g  17 g Oral Daily PRN Gaynelle Arabian, MD   17 g at 09/11/18 0908  . potassium chloride SA (K-DUR,KLOR-CON) CR tablet 20 mEq  20 mEq Oral Daily Gaynelle Arabian, MD   20 mEq at 09/11/18 0909  . sacubitril-valsartan (ENTRESTO) 49-51 mg per tablet  1 tablet Oral BID Gaynelle Arabian, MD   1  tablet at 09/11/18 1135  . thiamine (VITAMIN B-1) tablet 100 mg  100 mg Oral Daily Gaynelle Arabian, MD   100 mg at 09/11/18 0909  . traZODone (DESYREL) tablet 50 mg  50 mg Oral QHS Gaynelle Arabian, MD   50 mg at 09/10/18 2254  . Vitamin D (Ergocalciferol) (DRISDOL) capsule 50,000 Units  50,000 Units Oral Weekly Aluisio, Pilar Plate,  MD   50,000 Units at 09/10/18 1633     Discharge Medications: Please see discharge summary for a list of discharge medications.  Relevant Imaging Results:  Relevant Lab Results:   Additional Information [id number]  Wende Neighbors, LCSW

## 2018-09-11 NOTE — Clinical Social Work Note (Signed)
Clinical Social Work Assessment  Patient Details  Name: Yolanda [name] MRN: [id number] Date of Birth: 1933/12/01  Date of referral:  09/11/18               Reason for consult:  Discharge Planning, Facility Placement                Permission sought to share information with:  Family Supports Permission granted to share information::  Yes, Verbal Permission Granted  Name::     Lyda Jester  Agency::  [facility]  Relationship::  son  Contact Information:  [phone]  Housing/Transportation Living arrangements for the past 2 months:  [place] of Information:  Adult Children Patient Interpreter Needed:  None Criminal Activity/Legal Involvement Pertinent to Current Situation/Hospitalization:  No - Comment as needed Significant Relationships:  Adult Children, Community Support Lives with:  Facility Resident Do you feel safe going back to the place where you live?  Yes Need for family participation in patient care:  Yes (Comment)  Care giving concerns:  Patients son Ilona Sorrel at bedside. Patient is only cognitive to self. CSW spoke to patients son about discharge plans   Social Worker assessment / plan:  CSW met patient and son at bedside to offer support and discuss discharge plans. Patient is from [facility] and will return to there rehab section to get short term rehab. Ilona Sorrel stated he is agreeable and has spoken with facility.  CSW spoke with Loree Fee who stated that patient has a bed and authorization through insurance. [facility] stated they will be able to take patient over the weekend when medically stable  Employment status:  Retired Nurse, adult PT Recommendations:  [place] / Referral to community resources:  [place]  Patient/Family's Response to care:  Patients son understand CSW role in patients care. Son stated he appreciates the care his mother has received during her  stay  Patient/Family's Understanding of and Emotional Response to Diagnosis, Current Treatment, and Prognosis:  Patient will return [facility]   Emotional Assessment Appearance:  Appears stated age Attitude/Demeanor/Rapport:  Other Affect (typically observed):  Stable, Calm Orientation:  Oriented to Self Alcohol / Substance use:  Not Applicable Psych involvement (Current and /or in the community):  No (Comment)  Discharge Needs  Concerns to be addressed:  Care Coordination Readmission within the last 30 days:  No Current discharge risk:  Dependent with Mobility Barriers to Discharge:  Continued Medical Work up   [employer], LCSW 09/11/2018, 3:12 PM

## 2018-09-11 NOTE — Anesthesia Postprocedure Evaluation (Signed)
Anesthesia Post Note  Patient: Yolanda [name]  Procedure(s) Performed: INTRAMEDULLARY (IM) NAIL FEMORAL (Left Hip)     Patient location during evaluation: PACU Anesthesia Type: General Level of consciousness: awake and alert and confused (back to baseline) Pain management: pain level controlled Vital Signs Assessment: post-procedure vital signs reviewed and stable Respiratory status: spontaneous breathing, nonlabored ventilation, respiratory function stable and patient connected to nasal cannula oxygen Cardiovascular status: blood pressure returned to baseline and stable Postop Assessment: no apparent nausea or vomiting Anesthetic complications: no    Last Vitals:  Vitals:   09/11/18 0320 09/11/18 0640  BP: (!) 103/47 129/60  Pulse: 97 97  Resp: 18 (!) 21  Temp: 36.8 C 36.7 C  SpO2: 100% 97%    Last Pain:  Vitals:   09/11/18 0925  TempSrc:   PainSc: 2                  Tiajuana Amass

## 2018-09-11 NOTE — Progress Notes (Signed)
   Subjective: 2 Days Post-Op Procedure(s) (LRB): INTRAMEDULLARY (IM) NAIL FEMORAL (Left) Patient reports pain as moderate.   Patient seen in rounds with Dr. Wynelle Link. Patient is alert this AM, complains of muscle spasms in the left hip. Denies chest pain or SOB.  Objective: Vital signs in last 24 hours: Temp:  [97.9 F (36.6 C)-98.5 F (36.9 C)] 98.1 F (36.7 C) (01/24 0640) Pulse Rate:  [88-100] 97 (01/24 0640) Resp:  [16-21] 21 (01/24 0640) BP: (95-129)/(44-64) 129/60 (01/24 0640) SpO2:  [96 %-100 %] 97 % (01/24 0640) Weight:  [53.7 kg] 53.7 kg (01/24 0640)  Intake/Output from previous day:  Intake/Output Summary (Last 24 hours) at 09/11/2018 0730 Last data filed at 09/11/2018 0630 Gross per 24 hour  Intake 946.67 ml  Output 2100 ml  Net -1153.33 ml    Labs: Recent Labs    09/09/18 0522 09/10/18 0641 09/10/18 0950 09/11/18 0100  HGB 10.4* 7.4* 6.6* 8.0*   Recent Labs    09/10/18 0641 09/10/18 0950 09/11/18 0100  WBC 13.0*  --  12.1*  RBC 2.40*  --  2.55*  HCT 23.6* 22.3* 25.4*  PLT 299  --  221   Recent Labs    09/10/18 0641 09/11/18 0100  NA 138 137  K 5.0 4.0  CL 101 100  CO2 32 30  BUN 17 14  CREATININE 0.76 0.66  GLUCOSE 159* 106*  CALCIUM 8.9 9.2   Exam: General - Patient is Alert and Oriented Extremity - Neurologically intact Neurovascular intact Sensation intact distally Dorsiflexion/Plantar flexion intact Dressing/Incision - clean, dry, no drainage Motor Function - intact, moving foot and toes well on exam.   Past Medical History:  Diagnosis Date  . Adenomatous colon polyp    tubular  . Anxiety disorder    panic attacks, PMH of. Dr  Janna Arch  . 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 Diverticulosis   . Empyema ([place])    Left   . Fibroids    Lupron shots  . GERD (gastroesophageal reflux disease)   . High altitude sickness 2008  . Premature menopause    due to Lupron     Assessment/Plan: 2 Days Post-Op Procedure(s) (LRB): INTRAMEDULLARY (IM) NAIL FEMORAL (Left) Active Problems:   GERD   Dementia without behavioral disturbance (HCC)   Takotsubo cardiomyopathy   Closed left hip fracture (HCC)  Estimated body mass index is 20.97 kg/m as calculated from the following:   Height as of this encounter: 5\' 3"  (1.6 m).   Weight as of this encounter: 53.7 kg. Up with therapy  DVT Prophylaxis - Lovenox Partial weight bearing to the LLE (25%)  Hemoglobin dropped to 6.6 yesterday, 2 units of RBCs were ordered with Hgb stable at 8.0 this AM. Pt complaining of spasms despite use of robaxin, however do not want to switch to stronger muscle relaxer as this will worsen confusion.   Disposition: would benefit most from discharge to SNF for 24/7 assistance and supervision with ambulation  Theresa Duty, PA-C Orthopedic Surgery 09/11/2018, 7:30 AM

## 2018-09-11 NOTE — Progress Notes (Signed)
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  Apparently, patient had a prolonged stay at a skilled nursing facility following discharge from the hospital after treatment for congestive heart failure.  Patient was reported to have fallen on the fourth of arrival back at the assisted living facility with subsequent closed left hip fracture.  Orthopedic team has been consulted.  Cardiology team has also been consulted as the patient has extensive cardiac history.  09/09/2018: Patient seen and examined at bedside.  No acute events overnight.  She has no new complaints.  Plan for surgery today.   09/10/2018: Patient seen and examined with her personal sitter at bedside.  Somnolent but easily arousable to voices.  Hemoglobin dropped this morning to 6.6.  No sign of overt bleeding.  Will transfuse 2 unit PRBCs.  09/11/2018: Patient seen and examined at bedside.  Her personal sitter is present.  She endorses severe left hip pain this morning.  Adjusting her pain medications.  Possible transfer to SNF tomorrow 09/12/2018   Assessment/Plan: Active Problems:   GERD   Dementia without behavioral disturbance (HCC)   Takotsubo cardiomyopathy   Closed left hip fracture (HCC)  Closed left hip fracture, POD #2 post repair -Orthopedic team following.  Surgical repair on 09/09/2018 -Adjusting her pain medications -Plan to continue physical therapy at SNF -Plan discharge to SNF tomorrow 09/12/2018  Acute blood loss anemia post left hip repair No overt sign of bleeding Hemoglobin dropped to  6.6 Transfused 2 units PRBCs Hemoglobin this morning 8.0 Repeat CBC in the morning  History of Takotsubo cardiomyopathy: -No acute symptoms of CHF. -On beta-blocker, Entresto and spironolactone. -Repeat 2D echo done on 09/08/2018 revealed normal LVEF 60 to 65% with grade 1 diastolic dysfunction. -Continue cardiac medications as recommended by cardiology  Chronic diastolic CHF Last 2D echo done on 09/08/2018 revealed LVEF 60 to 65% with grade 1 diastolic dysfunction Continue strict I's and O's and daily weight Give 1 dose of 20 mg of IV Lasix posttransfusion to avoid pulmonary edema.  Dementia without behavioral problems:  Patient is on donepezil.   Continue to manage expectantly.    Patient history of chronic pain: Optimize pain control.    DVT prophylaxis:Subcu Lovenox daily Code Status:FULL CODE as per family  Family Communication:None at bedside.  Her personal sitter was present at the time of this visit. Disposition Plan:  Plan for discharge to SNF on 09/12/2018  consults: Orthopedics and cardiology.    Procedures:  Left hip surgical repair on 09/09/2018  Antimicrobials:   None    Objective: Vitals:   09/11/18 0302 09/11/18 0320 09/11/18 0640 09/11/18 1300  BP: (!) 108/49 (!) 103/47 129/60 114/61  Pulse: 99 97 97 (!) 101  Resp: 16 18 (!) 21 14  Temp: 97.9 F (36.6 C) 98.3 F (36.8 C) 98.1 F (36.7 C) 98.4 F (36.9 C)  TempSrc: Axillary Axillary Axillary Oral  SpO2: 100% 100% 97% 98%  Weight:   53.7 kg   Height:        Intake/Output Summary (Last 24 hours) at 09/11/2018 1541 Last data filed at 09/11/2018 1200 Gross per 24 hour  Intake 966.67 ml  Output 2400 ml  Net -1433.33 ml   Filed Weights   09/11/18 0640  Weight: 53.7 kg    Exam:  . General: 85 y.o. year-old female well-developed well-nourished appears uncomfortable due to significant pain in her left hip.  Alert in the setting of dementia. . Cardiovascular: Regular rate and  rhythm with no rubs or gallops.  No JVD or thyromegaly noted. Marland Kitchen Respiratory: Clear to auscultation with no wheezes or rales.  Poor inspiratory effort. . Abdomen: Soft nontender nondistended with normal bowel sounds x4 quadrants. . Musculoskeletal: Trace lower extremity edema.  Area of entry from surgery appears mildly edematous.  No obvious bruises noted.  2/4 pulses in all 4 extremities. Marland Kitchen Psychiatry: Mood is appropriate for condition and setting   Data Reviewed: CBC: Recent Labs  Lab 09/07/18 2111 09/08/18 0510 09/09/18 0522 09/10/18 0641 09/10/18 0950 09/11/18 0100  WBC 11.6* 13.1* 13.1* 13.0*  --  12.1*  NEUTROABS 8.4*  --  9.6*  --   --   --   HGB 11.2* 10.2* 10.4* 7.4* 6.6* 8.0*  HCT 36.2 32.9* 34.6* 23.6* 22.3* 25.4*  MCV 99.5 99.4 103.6* 98.3  --  99.6  PLT 369 341 314 299  --  778   Basic Metabolic Panel: Recent Labs  Lab 09/07/18 2111 09/08/18 0510 09/10/18 0641 09/11/18 0100  NA 136 135 138 137  K 4.2 3.9 5.0 4.0  CL 99 99 101 100  CO2 30 28 32 30  GLUCOSE 116* 108* 159* 106*  BUN 17 18 17 14   CREATININE 0.80 0.72 0.76 0.66  CALCIUM 9.7 9.6 8.9 9.2   GFR: Estimated Creatinine Clearance: 44.8 mL/min (by C-G formula based on SCr of 0.66 mg/dL). Liver Function Tests: Recent Labs  Lab 09/08/18 0510  ALBUMIN 3.1*   No results for input(s): LIPASE, AMYLASE in the last 168 hours. No results for input(s): AMMONIA in the last 168 hours. Coagulation Profile: Recent Labs  Lab 09/07/18 2111  INR 0.91   Cardiac Enzymes: Recent Labs  Lab 09/07/18 2111  TROPONINI <0.03   BNP (last 3 results) No results for input(s): PROBNP in the last 8760 hours. HbA1C: No results for input(s): HGBA1C in the last 72 hours. CBG: Recent Labs  Lab 09/09/18 0737  GLUCAP 107*   Lipid Profile: No results for input(s): CHOL, HDL, LDLCALC, TRIG, CHOLHDL, LDLDIRECT in the last 72 hours. Thyroid Function Tests: No results for input(s): TSH, T4TOTAL, FREET4, T3FREE,  THYROIDAB in the last 72 hours. Anemia Panel: No results for input(s): VITAMINB12, FOLATE, FERRITIN, TIBC, IRON, RETICCTPCT in the last 72 hours. Urine analysis:    Component Value Date/Time   COLORURINE AMBER (A) 06/29/2018 1555   APPEARANCEUR CLEAR 06/29/2018 1555   LABSPEC 1.026 06/29/2018 1555   PHURINE 5.0 06/29/2018 [place] 06/29/2018 1555   HGBUR NEGATIVE 06/29/2018 [place] 06/29/2018 1555   BILIRUBINUR neg 01/23/2012 1445   KETONESUR 20 (A) 06/29/2018 1555   PROTEINUR 30 (A) 06/29/2018 1555   UROBILINOGEN 0.2 01/23/2012 1445   NITRITE NEGATIVE 06/29/2018 1555   LEUKOCYTESUR NEGATIVE 06/29/2018 1555   Sepsis Labs: @LABRCNTIP (procalcitonin:4,lacticidven:4)  ) Recent Results (from the past 240 hour(s))  Surgical PCR screen     Status: None   Collection Time: 09/08/18 12:23 AM  Result Value Ref Range Status   MRSA, PCR NEGATIVE NEGATIVE Final   Staphylococcus aureus NEGATIVE NEGATIVE Final    Comment: (NOTE) The Xpert SA Assay (FDA approved for NASAL specimens in patients 22  years of age and older), is one component of a comprehensive surveillance program. It is not intended to diagnose infection nor to guide or monitor treatment. Performed at [facility], [place] [address], [place], [place] [address]       Studies: No results found.  Scheduled Meds: . sodium chloride   Intravenous Once  . docusate sodium  100 mg Oral BID  . donepezil  10 mg Oral QHS  . DULoxetine  60 mg Oral Daily  . enoxaparin (LOVENOX) injection  30 mg Subcutaneous Q24H  . folic acid  1 mg Oral Daily  . mouth rinse  15 mL Mouth Rinse BID  . metoprolol succinate  25 mg Oral Daily  . naloxegol oxalate  12.5 mg Oral Daily  . oxyCODONE  20 mg Oral BID  . potassium chloride SA  20 mEq Oral Daily  . sacubitril-valsartan  1 tablet Oral BID  . thiamine  100 mg Oral Daily  . traZODone  50 mg Oral QHS  . Vitamin D (Ergocalciferol)  50,000  Units Oral Weekly    Continuous Infusions: . sodium chloride 75 mL/hr at 09/10/18 1131  . methocarbamol (ROBAXIN) IV       LOS: 4 days     Kayleen Memos, MD [facility] Pager [phone]  If 7PM-7AM, please contact night-coverage [email or web address] Password [facility] 09/11/2018, 3:41 PM

## 2018-09-12 LAB — CBC
HCT: 32.8 % — ABNORMAL LOW (ref 36.0–46.0)
Hemoglobin: 10.1 g/dL — ABNORMAL LOW (ref 12.0–15.0)
MCH: 30.2 pg (ref 26.0–34.0)
MCHC: 30.8 g/dL (ref 30.0–36.0)
MCV: 98.2 fL (ref 80.0–100.0)
Platelets: 288 10*3/uL (ref 150–400)
RBC: 3.34 MIL/uL — ABNORMAL LOW (ref 3.87–5.11)
RDW: 14.3 % (ref 11.5–15.5)
WBC: 9.6 10*3/uL (ref 4.0–10.5)
nRBC: 0 % (ref 0.0–0.2)

## 2018-09-12 LAB — TYPE AND SCREEN
ABO/RH(D): O POS
Antibody Screen: NEGATIVE
Unit division: 0

## 2018-09-12 LAB — BASIC METABOLIC PANEL
Anion gap: 5 (ref 5–15)
BUN: 15 mg/dL (ref 8–23)
CALCIUM: 9.2 mg/dL (ref 8.9–10.3)
CO2: 33 mmol/L — ABNORMAL HIGH (ref 22–32)
Chloride: 100 mmol/L (ref 98–111)
Creatinine, Ser: 0.61 mg/dL (ref 0.44–1.00)
GFR calc Af Amer: >60 mL/min (ref >60)
Glucose, Bld: 97 mg/dL (ref 70–99)
Potassium: 4.3 mmol/L (ref 3.5–5.1)
Sodium: 138 mmol/L (ref 135–145)

## 2018-09-12 LAB — BPAM RBC
Blood Product Expiration Date: 202002202359
ISSUE DATE / TIME: 202001240258
Unit Type and Rh: 5100

## 2018-09-12 NOTE — Discharge Summary (Signed)
Discharge Summary  Yolanda [name] FUX:[id number] DOB: 03-08-1934  PCP: Javier Glazier, MD  Admit date: 09/07/2018 Discharge date: 09/12/2018  Time spent: 35 minutes  Recommendations for Outpatient Follow-up:  1. Follow-up with your PCP 2. Follow-up with orthopedic surgery 3. Take medications as prescribed 4. Continue physical therapy 5. Fall precautions 6. Continue DVT prophylaxis as recommended by orthopedic surgery  Recommendation per orthopedic surgery: Estimated body mass index is 20.97 kg/m as calculated from the following:   Height as of this encounter: _0  (1.6 m).   Weight as of this encounter: 53.7 kg. Up with therapy  DVT Prophylaxis - Lovenox Partial weight bearing to the LLE (25%)  Discharge Diagnoses:  Active Hospital Problems   Diagnosis Date Noted  . Closed left hip fracture ([place]) 09/07/2018  . Takotsubo cardiomyopathy 07/05/2018  . Dementia without behavioral disturbance ([place]) 06/29/2018  . GERD 05/19/2008    Resolved Hospital Problems  No resolved problems to display.    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 Apparently, patient had a prolonged stay at [facility] following discharge from the hospital after treatment for congestive heart failure. Patient was reported to have fallen on the fourth of arrival back at the assisted living facility with subsequent closed left hip  fracture. Orthopedic team consulted for surgical repair. Cardiology team also consulted due to the patient's extensive cardiac history.    Hospital course complicated by acute blood loss anemia for which she received 2 units of PRBCs on 09/10/2018.  Hemoglobin has remained stable since.  Also marked by constipation which is chronic, per her personal sitter at bedside.  At baseline she has 1 bowel movement once a week despite use of laxative and stool softeners.  09/12/2018: Patient seen and examined with her personal sitter at bedside.  No acute events overnight.  She has no new complaints this morning.  Vital signs are stable and her hemoglobin count is also stable at 10.1.  On the day of discharge, the patient was hemodynamically stable.  She will need to follow-up with her primary care provider and orthopedic surgery posthospitalization.  She will also need to continue physical therapy as recommended by orthopedic surgery.  Hospital Course:  Active Problems:   GERD   Dementia without behavioral disturbance (HCC)   Takotsubo cardiomyopathy   Closed left hip fracture (HCC)  Closed left hip fracture, POD #3 post repair -Orthopedic team consulted.  Surgical repair on 09/09/2018 -Continue pain management and bowel regimen -Continue physical therapy as recommended by orthopedic surgery -Continue DVT prophylaxis as recommended by orthopedic surgery, Ortho provided a prescription  Acute blood loss anemia post left hip repair No overt sign of bleeding Hemoglobin dropped to 6.6 on 09/10/2018 Transfused 2 units PRBCs Hemoglobin this morning 10.1  History of Takotsubo cardiomyopathy: -No acute symptoms of CHF. -On beta-blocker, Entresto and spironolactone. -Repeat 2D echo done on 09/08/2018 revealed normal LVEF 60 to 65% with grade 1 diastolic dysfunction. -Continue cardiac medications as recommended by cardiology  Chronic  diastolic CHF Last 2D echo done on 09/08/2018 revealed LVEF 60 to 65%  with grade 1 diastolic dysfunction Continue strict I's and O's and daily weight  Dementiawithout behavioral problems:  Patient is on donepezil.  Continue to manage expectantly.  Patient history of chronic pain: Optimize pain control.  Patient history of chronic constipation Continue bowel regimen  DVT prophylaxis:Subcu Lovenox daily as recommended by orthopedic surgery   Code Status:FULL CODE as per family  Consults:Orthopedics and cardiology.    Procedures: Left hip surgical repair on 09/09/2018  Antimicrobials:  None   Discharge Exam: BP 112/60 (BP Location: Left Arm)   Pulse 80   Temp 98.7 F (37.1 C) (Oral)   Resp 16   Ht _0  (1.6 m)   Wt 55.7 kg   SpO2 96%   BMI 21.75 kg/m  . General: 85 y.o. year-old female well developed well nourished in no acute distress.  Alert and interactive. . Cardiovascular: Regular rate and rhythm with no rubs or gallops.  No thyromegaly or JVD noted.   Marland Kitchen Respiratory: Clear to auscultation with no wheezes or rales. Good inspiratory effort. . Abdomen: Soft nontender nondistended with normal bowel sounds x4 quadrants. . Musculoskeletal: Surgical site appears clean with minimal swelling. 2/4 pulses in all 4 extremities. Marland Kitchen Psychiatry: Mood is appropriate for condition and setting  Discharge Instructions You were cared for by a hospitalist during your hospital stay. If you have any questions about your discharge medications or the care you received while you were in the hospital after you are discharged, you can call the unit and asked to speak with the hospitalist on call if the hospitalist that took care of you is not available. Once you are discharged, your primary care physician will handle any further medical issues. Please note that NO REFILLS for any discharge medications will be authorized once you are discharged, as it is imperative that you return to your primary care physician (or establish a relationship  with a primary care physician if you do not have one) for your aftercare needs so that they can reassess your need for medications and monitor your lab values.  Discharge Instructions    Call MD / Call 911   Complete by:  As directed    If you experience chest pain or shortness of breath, CALL 911 and be transported to the hospital emergency room.  If you develope a fever above 101 F, pus (white drainage) or increased drainage or redness at the wound, or calf pain, call your surgeon's office.   Constipation Prevention   Complete by:  As directed    Drink plenty of fluids.  Prune juice may be helpful.  You may use a stool softener, such as Colace (over the counter) 100 mg twice a day.  Use MiraLax (over the counter) for constipation as needed.   Diet - low sodium heart healthy   Complete by:  As directed    Discharge wound care:   Complete by:  As directed    If you have a hip bandage, keep it clean and dry.  Change bandage with 4x4 gauze and paper tape once a day until you follow up with Dr. Wynelle Link in the office.   Do not sit on low chairs, stoools or toilet seats, as it may be difficult to get up from low surfaces   Complete by:  As directed    Partial weight bearing   Complete by:  As directed      Allergies as  of 09/12/2018   No Known Allergies     Medication List    STOP taking these medications   docusate 50 MG/5ML liquid Commonly known as:  COLACE   naproxen sodium 220 MG tablet Commonly known as:  ALEVE   potassium chloride SA 20 MEQ tablet Commonly known as:  K-DUR,KLOR-CON   traZODone 50 MG tablet Commonly known as:  DESYREL   XTAMPZA ER 18 MG C12a Generic drug:  oxyCODONE ER Replaced by:  oxyCODONE 20 mg 12 hr tablet     TAKE these medications   acetaminophen 500 MG tablet Commonly known as:  TYLENOL Take 1,000 mg by mouth every 8 (eight) hours as needed.   cyclobenzaprine 5 MG tablet Commonly known as:  FLEXERIL Take 5 mg by mouth 3 (three) times daily as  needed for muscle spasms.   donepezil 10 MG tablet Commonly known as:  ARICEPT Take 10 mg by mouth at bedtime.   DULoxetine 60 MG capsule Commonly known as:  CYMBALTA Take 60 mg by mouth daily.   enoxaparin 30 MG/0.3ML injection Commonly known as:  LOVENOX Inject 0.3 mLs (30 mg total) into the skin daily for 10 days. Then take one 81 mg aspirin once a day for three weeks. Then discontinue aspirin.   famotidine 20 MG tablet Commonly known as:  PEPCID Take 1 tablet (20 mg total) by mouth 2 (two) times daily.   feeding supplement (ENSURE ENLIVE) Liqd Take 237 mLs by mouth 2 (two) times daily between meals.   folic acid 1 MG tablet Commonly known as:  FOLVITE Take 1 tablet (1 mg total) by mouth daily.   furosemide 40 MG tablet Commonly known as:  LASIX Take 1 tablet (40 mg total) by mouth daily. What changed:    when to take this  reasons to take this   HYDROcodone-acetaminophen 5-325 MG tablet Commonly known as:  NORCO/VICODIN Take 1-2 tablets by mouth every 6 (six) hours as needed for moderate pain.   LORazepam 0.5 MG tablet Commonly known as:  ATIVAN Take 1 tablet (0.5 mg total) by mouth every 8 (eight) hours as needed for anxiety. What changed:  when to take this   methocarbamol 500 MG tablet Commonly known as:  ROBAXIN Take 1 tablet (500 mg total) by mouth every 6 (six) hours as needed for muscle spasms.   metoprolol succinate 25 MG 24 hr tablet Commonly known as:  TOPROL XL Take 1 tablet (25 mg total) by mouth daily. Take with or immediately following a meal.   MOVANTIK 12.5 MG Tabs tablet Generic drug:  naloxegol oxalate Take 1 tablet (12.5 mg total) by mouth daily.   multivitamin with minerals Tabs tablet Take 1 tablet by mouth daily.   oxyCODONE 20 mg 12 hr tablet Commonly known as:  OXYCONTIN Take 1 tablet (20 mg total) by mouth 2 (two) times daily. Replaces:  XTAMPZA ER 18 MG C12a   polyethylene glycol packet Commonly known as:  MIRALAX /  GLYCOLAX Take 17 g by mouth daily. What changed:    when to take this  reasons to take this   sacubitril-valsartan 49-51 MG Commonly known as:  ENTRESTO Take 1 tablet by mouth 2 (two) times daily.   senna-docusate 8.6-50 MG tablet Commonly known as:  Senokot-S Take 2 tablets by mouth 2 (two) times daily.   thiamine 100 MG tablet Take 1 tablet (100 mg total) by mouth daily.   Vitamin D3 1.25 MG (50000 UT) Caps Take 50,000 Units by mouth every Friday.  Discharge Care Instructions  (From admission, onward)         Start     Ordered   09/10/18 0000  Partial weight bearing     09/10/18 0804   09/10/18 0000  Discharge wound care:    Comments:  If you have a hip bandage, keep it clean and dry.  Change bandage with 4x4 gauze and paper tape once a day until you follow up with Dr. Wynelle Link in the office.   09/10/18 0804         No Known Allergies Follow-up Information    Gaynelle Arabian, MD. Schedule an appointment as soon as possible for a visit on 09/24/2018.   Specialty:  Orthopedic Surgery Contact information: [address] [place] [address] [phone]        Javier Glazier, MD. Call in 1 day(s).   Specialty:  Internal Medicine Why:  Please call for a post hospital follow-up appointment. Contact information: [place] [address] [phone]        Pixie Casino, MD .   Specialty:  Cardiology Contact information: [address] [place] [address] [phone]            The results of significant diagnostics from this hospitalization (including imaging, microbiology, ancillary and laboratory) are listed below for reference.    Significant Diagnostic Studies: Dg Chest 2 View  Result Date: 08/31/2018 CLINICAL DATA:  Wheezing and increased shortness of breath over 6 weeks. History of congestive heart failure. EXAM: CHEST - 2 VIEW COMPARISON:  07/03/2018 FINDINGS: Heart size and  pulmonary vascularity are normal. No vascular congestion, edema, or consolidation. Blunting of the left posterior costophrenic angle may represent small pleural effusion. No pneumothorax. Calcified and tortuous aorta. Surgical clips in the right axilla and left chest wall. Spinal stimulator leads projected over the midthoracic region. Degenerative changes in the spine. IMPRESSION: Small left pleural effusion. No evidence of active pulmonary disease. Electronically Signed   By: Lucienne Capers M.D.   On: 08/31/2018 20:08   Ct Head Wo Contrast  Result Date: 09/07/2018 CLINICAL DATA:  Unwitnessed fall EXAM: CT HEAD WITHOUT CONTRAST TECHNIQUE: Contiguous axial images were obtained from the base of the skull through the vertex without intravenous contrast. COMPARISON:  CT brain 09/02/2018 FINDINGS: Brain: No acute territorial infarction, hemorrhage, or intracranial mass. Mild motion degradation. Chronic infarcts in the right thalamus, right occipital lobe, and right cerebellum. Moderate atrophy. Mild small vessel ischemic changes of the white matter. Stable ventricle size. Vascular: No hyperdense vessels.  Carotid vascular calcification Skull: Fracture Sinuses/Orbits: No acute finding. Other: None IMPRESSION: 1. Mild motion degraded study 2. No definite CT evidence for acute intracranial abnormality 3. Atrophy and mild small vessel ischemic changes of the white matter. Old infarcts in the right thalamus, occipital lobe and right cerebellum. Electronically Signed   By: Donavan Foil M.D.   On: 09/07/2018 21:44   Ct Head Wo Contrast  Result Date: 09/02/2018 CLINICAL DATA:  Altered mental status. Memory loss. History of prior breast carcinoma EXAM: CT HEAD WITHOUT CONTRAST TECHNIQUE: Contiguous axial images were obtained from the base of the skull through the vertex without intravenous contrast. COMPARISON:  April 03, 2018 FINDINGS: Brain: There is mild diffuse atrophy, stable. There is no intracranial mass,  hemorrhage, extra-axial fluid collection, or midline shift. There is small vessel disease in the centra semiovale bilaterally. There is evidence of a prior infarct in the medial right occipital lobe which is stable.  There is evidence of a prior infarct in the posterolateral right thalamus, stable. There is evidence of a prior infarct in the lateral right cerebellar hemisphere. No acute appearing infarct is demonstrable on this study. Vascular: There is no hyperdense vessel. There is calcification in each carotid siphon region. Skull: There are several subtle lucent areas throughout the bony calvarium representing a change from recent prior study. No fracture or frank bony destruction evident. Sinuses/Orbits: There is mucosal thickening in several ethmoid air cells. Other paranasal sinuses are clear. Orbits appear symmetric bilaterally. Other: Mastoid air cells are clear. IMPRESSION: 1. Stable atrophy with prior infarcts as noted. There is periventricular small vessel disease. No acute infarct. No mass or hemorrhage. 2. Several rather subtle areas of decreased attenuation in the calvarium near the vertex, representing a change from prior study. Etiology for these lucent areas is uncertain. Multiple myeloma potentially can present in this manner. It may be prudent to correlate with serum electrophoresis in this regard. Metastatic disease is also a potential differential consideration. 3.  Areas of arterial vascular calcification noted. 4.  Mucosal thickening in several ethmoid air cells. These results will be called to the ordering clinician or representative by the Radiologist Assistant, and communication documented in the PACS or zVision Dashboard. Electronically Signed   By: Lowella Grip III M.D.   On: 09/02/2018 13:21   Dg Chest Portable 1 View  Result Date: 09/07/2018 CLINICAL DATA:  Preop, femur fracture EXAM: PORTABLE CHEST 1 VIEW COMPARISON:  09/07/2017, 08/31/2018 FINDINGS: Thoracic ascending  stimulator leads. No acute airspace disease. Stable cardiomediastinal silhouette. Aortic atherosclerosis. No pneumothorax. Surgical clips in the axillary regions. IMPRESSION: No active disease. Electronically Signed   By: Donavan Foil M.D.   On: 09/07/2018 21:52   Dg C-arm 1-60 Min-no Report  Result Date: 09/09/2018 Fluoroscopy was utilized by the requesting physician.  No radiographic interpretation.   Dg Hip Unilat W Or Wo Pelvis 2-3 Views Left  Result Date: 09/07/2018 CLINICAL DATA:  Initial evaluation for acute trauma, fall. EXAM: DG HIP (WITH OR WITHOUT PELVIS) 2-3V LEFT COMPARISON:  None. FINDINGS: Acute comminuted fracture of the intertrochanteric left femur with subtrochanteric extension with displacement. Femoral head remains normally position within the acetabulum. Bony pelvis intact. Limited views of the right hip demonstrate no acute finding. Osteopenia noted. Underlying osteoarthritic changes noted about the hips. Probable calcified fibroid overlies the mid pelvis. IMPRESSION: Acute comminuted mildly displaced fracture of the intertrochanteric left hip with subtrochanteric extension. Electronically Signed   By: Jeannine Boga M.D.   On: 09/07/2018 21:39   Dg Femur Min 2 Views Left  Result Date: 09/09/2018 CLINICAL DATA:  85 year old female with ORIF of the left femoral fracture. EXAM: LEFT FEMUR 2 VIEWS COMPARISON:  Left hip radiograph dated 09/07/2018 FINDINGS: Open reduction internal fixation of left femoral fracture with placement of an intramedullary rod and two transcervical screws. The orthopedic hardware appears intact. IMPRESSION: Open reduction internal fixation of left femoral fracture. Intact hardware. Electronically Signed   By: Anner Crete M.D.   On: 09/09/2018 19:34   Dg Femur Min 2 Views Left  Result Date: 09/07/2018 CLINICAL DATA:  Initial evaluation for acute trauma, fall. EXAM: LEFT FEMUR 2 VIEWS COMPARISON:  None. FINDINGS: No acute fracture about the  visualized mid and distal left femur. Visualized proximal tibia and fibula intact. Osteoarthritic changes present about the knee. Diffuse osteopenia. No soft tissue abnormality. Vascular calcifications noted within the left leg. IMPRESSION: No other acute osseous abnormality about the mid-distal left femur.  Electronically Signed   By: Jeannine Boga M.D.   On: 09/07/2018 21:41    Microbiology: Recent Results (from the past 240 hour(s))  Surgical PCR screen     Status: None   Collection Time: 09/08/18 12:23 AM  Result Value Ref Range Status   MRSA, PCR NEGATIVE NEGATIVE Final   Staphylococcus aureus NEGATIVE NEGATIVE Final    Comment: (NOTE) The Xpert SA Assay (FDA approved for NASAL specimens in patients 65 years of age and older), is one component of a comprehensive surveillance program. It is not intended to diagnose infection nor to guide or monitor treatment. Performed at [facility], [place] [address], [place], [place] [address]      Labs: Basic Metabolic Panel: Recent Labs  Lab 09/07/18 2111 09/08/18 0510 09/10/18 0641 09/11/18 0100 09/12/18 0555  NA 136 135 138 137 138  K 4.2 3.9 5.0 4.0 4.3  CL 99 99 101 100 100  CO2 30 28 32 30 33*  GLUCOSE 116* 108* 159* 106* 97  BUN _0 CREATININE 0.80 0.72 0.76 0.66 0.61  CALCIUM 9.7 9.6 8.9 9.2 9.2   Liver Function Tests: Recent Labs  Lab 09/08/18 0510  ALBUMIN 3.1*   No results for input(s): LIPASE, AMYLASE in the last 168 hours. No results for input(s): AMMONIA in the last 168 hours. CBC: Recent Labs  Lab 09/07/18 2111 09/08/18 0510 09/09/18 0522 09/10/18 0641 09/10/18 0950 09/11/18 0100 09/12/18 0555  WBC 11.6* 13.1* 13.1* 13.0*  --  12.1* 9.6  NEUTROABS 8.4*  --  9.6*  --   --   --   --   HGB 11.2* 10.2* 10.4* 7.4* 6.6* 8.0* 10.1*  HCT 36.2 32.9* 34.6* 23.6* 22.3* 25.4* 32.8*  MCV 99.5 99.4 103.6* 98.3  --  99.6 98.2  PLT 369 341 314 299  --  221 288   Cardiac  Enzymes: Recent Labs  Lab 09/07/18 2111  TROPONINI <0.03   BNP: BNP (last 3 results) Recent Labs    07/01/18 1058  BNP 862.1*    ProBNP (last 3 results) No results for input(s): PROBNP in the last 8760 hours.  CBG: Recent Labs  Lab 09/09/18 0737  GLUCAP 107*       Signed:  Kayleen Memos, MD [facility] 09/12/2018, 10:48 AM

## 2018-09-12 NOTE — Clinical Social Work Placement (Signed)
Patient discharging to [facility] room 7009. Confirmed bed availability & will fax required docs.  [facility] will be used for transport. Son at bedside and aware of d/c plans.  RN call report: [phone]  CLINICAL SOCIAL WORK PLACEMENT  NOTE  Date:  09/12/2018  Patient Details  Name: Yolanda [name] MRN: [id number] Date of Birth: 04/15/1934  Clinical Social Work is seeking post-discharge placement for this patient at the [place] level of care (*CSW will initial, date and re-position this form in  chart as items are completed):      Patient/family provided with [place] Work Department's list of facilities offering this level of care within the geographic area requested by the patient (or if unable, by the patient's family).  Yes   Patient/family informed of their freedom to choose among providers that offer the needed level of care, that participate in Medicare, Medicaid or managed care program needed by the patient, have an available bed and are willing to accept the patient.      Patient/family informed of [facility]'s ownership interest in [facility] and [facility], as well as of the fact that they are under no obligation to receive care at these facilities.  PASRR submitted to EDS on       PASRR number received on       Existing PASRR number confirmed on 09/11/18     FL2 transmitted to all facilities in geographic area requested by pt/family on     N/a: Patient resident of [facility] and will go there.  FL2 transmitted to all facilities within larger geographic area on       Patient informed that his/her managed care company has contracts with or will negotiate with certain facilities, including the following:            Patient/family informed of bed offers received.  Patient chooses bed at [facility] [place] recommends and patient chooses bed at      Patient to be transferred to [facility] [place] on  09/12/18.  Patient to be transferred to facility by [facility]     Patient family notified on 09/12/18 of transfer.  Name of family member notified:  Son     PHYSICIAN Please prepare priority discharge summary, including medications     Additional Comment:    _______________________________________________ Pricilla Holm, [place] 09/12/2018, 10:48 AM

## 2018-09-12 NOTE — Progress Notes (Signed)
Report called to nurse Neshea at [facility]. Son at bedside, aware of plans. Awaiting [facility] at this time.

## 2018-09-12 NOTE — Progress Notes (Signed)
   Subjective: 3 Days Post-Op Procedure(s) (LRB): INTRAMEDULLARY (IM) NAIL FEMORAL (Left)  Recheck left hip s/p IM nail Denies any new symptoms or issues Therapy going fairly well Denies any new symptoms or issues Patient reports pain as mild.  Objective:   VITALS:   Vitals:   09/11/18 2230 09/12/18 0542  BP: 116/64 112/60  Pulse: 81 80  Resp: 16 16  Temp: 99.5 F (37.5 C) 98.7 F (37.1 C)  SpO2: 95% 96%    Left hip incision healing well nv intact distally No rashes or edema distally  LABS Recent Labs    09/10/18 0641 09/10/18 0950 09/11/18 0100 09/12/18 0555  HGB 7.4* 6.6* 8.0* 10.1*  HCT 23.6* 22.3* 25.4* 32.8*  WBC 13.0*  --  12.1* 9.6  PLT 299  --  221 288    Recent Labs    09/10/18 0641 09/11/18 0100 09/12/18 0555  NA 138 137 138  K 5.0 4.0 4.3  BUN 17 14 15   CREATININE 0.76 0.66 0.61  GLUCOSE 159* 106* 97     Assessment/Plan: 3 Days Post-Op Procedure(s) (LRB): INTRAMEDULLARY (IM) NAIL FEMORAL (Left) Continue current treatment Discussed lovenox with pt and Dr. Nevada Crane Continue therapy D/c planning     Brad Doren Custard PA-C, [place] is now [facility]  [facility] [address] [place], [place], [place] [address] Phone: [phone] [email or web address] Facebook  [employer]

## 2018-10-01 ENCOUNTER — Ambulatory Visit (INDEPENDENT_AMBULATORY_CARE_PROVIDER_SITE_OTHER): Payer: Commercial Managed Care - PPO | Admitting: Pulmonary Disease

## 2018-10-01 ENCOUNTER — Encounter: Payer: Self-pay | Admitting: Pulmonary Disease

## 2018-10-01 VITALS — BP 116/74 | HR 92 | Ht 64.0 in | Wt 109.0 lb

## 2018-10-01 DIAGNOSIS — J432 Centrilobular emphysema: Secondary | ICD-10-CM | POA: Diagnosis not present

## 2018-10-01 DIAGNOSIS — R0902 Hypoxemia: Secondary | ICD-10-CM

## 2018-10-01 DIAGNOSIS — R0609 Other forms of dyspnea: Secondary | ICD-10-CM

## 2018-10-01 MED ORDER — UMECLIDINIUM BROMIDE 62.5 MCG/INH IN AEPB: 1.0000 | INHALATION_SPRAY | Freq: Every day | RESPIRATORY_TRACT | 5 refills | Status: AC

## 2018-10-01 NOTE — Patient Instructions (Signed)
Incruse 1 puff daily  Follow up in 1 month with Dr. Halford Chessman or Tammy Parrett in [facility] office

## 2018-10-01 NOTE — Progress Notes (Signed)
   Subjective:    Patient ID: Yolanda [name], female    DOB: 1934/05/20, 85 y.o.   MRN: [id number]  HPI    Review of Systems  Constitutional: Negative for fever and unexpected weight change.  HENT: Negative for congestion, dental problem, ear pain, nosebleeds, postnasal drip, rhinorrhea, sinus pressure, sneezing, sore throat and trouble swallowing.   Eyes: Negative for redness and itching.  Respiratory: Positive for shortness of breath and wheezing. Negative for cough and chest tightness.   Cardiovascular: Negative for palpitations and leg swelling.  Gastrointestinal: Negative for nausea and vomiting.  Genitourinary: Negative for dysuria.  Musculoskeletal: Negative for joint swelling.  Skin: Negative for rash.  Allergic/Immunologic: Negative.  Negative for environmental allergies, food allergies and immunocompromised state.  Neurological: Negative for headaches.  Hematological: Bruises/bleeds easily.  Psychiatric/Behavioral: Negative for dysphoric mood. The patient is nervous/anxious.        Objective:   Physical Exam        Assessment & Plan:

## 2018-10-01 NOTE — Progress Notes (Signed)
[facility]  Chief Complaint  Patient presents with  . pulm consult    Pt has SOB on exertion, and some wheezing.    Constitutional:  BP 116/74 (BP Location: Left Arm, Cuff Size: Normal)   Pulse 92   Ht 5\' 4"  (1.626 m)   Wt 109 lb (49.4 kg)   SpO2 92%   BMI 18.71 kg/m   Past Medical History:  CVA, Takotsubo CM, ETOH, Lt empyema 2018, HTN, Anxiety, Chronic pain, GERD, PNA  Brief Summary:  Yolanda [name] is a 85 y.o. female former smoker with dyspnea.  She was seen by pulmonary service in 2018 for Lt empyema requiring chest tube drainage and in 2019 for pneumonia and cardiomyopathy.  She resides in [facility].  She is currently in rehab unit recovering from hip fracture.  She is followed by cardiology for CM.  Most recent echo from January showed improved EF.  She isn't needing as much supplemental oxygen during rehab, and now only uses intermittently when working with physical therapist.  Her CT chest imaging studies show advanced emphysema.  Her son was not aware that she had emphysema.  She gets winded with activity.  Has occasional cough and wheeze.  Doesn't have sputum, chest congestion, fever, hemoptysis, or chest pain.  Used inhalers when in the hospital and these helped.  Not on any inhalers at present.  She still needs assistance with mobility and is only 25% weight baring with assistance until her hip improves more.  CXR 09/07/18 (reviewed by me) > hyperinflation; no ASD.  Physical Exam:   Appearance - thin  ENMT - clear nasal mucosa, midline nasal  septum, no oral exudates, no LAN, trachea midline  Respiratory - normal chest wall, normal respiratory effort, no accessory muscle use, no wheeze/rales, decreased BS  CV - s1s2 regular rate and rhythm, no murmurs, no peripheral edema, radial pulses symmetric  GI - soft, non tender, no masses  Lymph - no adenopathy noted in neck and axillary areas  MSK - decreased muscle  bulk, sitting in wheel chair  Ext - no cyanosis, clubbing, or joint inflammation noted  Skin - no rashes, lesions, or ulcers  Neuro - normal strength, oriented x 3  Psych - normal mood and affect   Assessment/Plan:   Centrilobular emphysema. - add incruse - will need to schedule PFT when she has recovered fully from recent hip fracture  Takostubo cardiomyopathy. - most recent Echo showed improvement in EF - f/u with [facility]  Intermittent hypoxia after pneumonia. - still needs supplemental oxygen with activity during rehab - reassess needs at next visit   Patient Instructions  Incruse 1 puff daily  Follow up in 1 month with Dr. Halford [name] or Yolanda [name] in [facility] office    Yolanda Mires, MD [place] Pager: [phone] 10/01/2018, 11:45 AM  Flow Sheet     Pulmonary tests:    Chest imaging:  CT chest 07/01/18 >> enlarged PA, advanced centrilobular emphysema  Cardiac tests:  Echo 09/08/18 >> mild LVH, EF 60 to 65%, grade 1 DD, mild AR,    Review of Systems:  Constitutional: Negatve for fever and unexpected weight change.  HENT: Negative for congestion, dental problem, ear pain, nosebleeds, postnasal drip, rhinorrhea, sinus pressure, sneezing, sore throat and trouble swallowing.   Eyes: Negative for redness and itching.  Respiratory: Positive for shortness of breath and wheezing. Negative for cough and chest tightness.   Cardiovascular: Negative for palpitations  and leg swelling.  Gastrointestinal: Negative for nausea and vomiting.  Genitourinary: Negative for dysuria.  Musculoskeletal: Negative for joint swelling.  Skin: Negative for rash.  Allergic/Immunologic: Negative.  Negative for environmental allergies, food allergies and immunocompromised state.  Neurological: Negative for headaches.  Hematological: Bruises/bleeds easily.  Psychiatric/Behavioral: Negative for dysphoric mood. The patient is nervous/anxious.     Medications:   Allergies as of 10/01/2018   No Known Allergies     Medication List       Accurate as of October 01, 2018 11:45 AM. Always use your most recent med list.        acetaminophen 500 MG tablet Commonly known as:  TYLENOL Take 1,000 mg by mouth every 8 (eight) hours as needed.   cyclobenzaprine 5 MG tablet Commonly known as:  FLEXERIL Take 5 mg by mouth 3 (three) times daily as needed for muscle spasms.   donepezil 10 MG tablet Commonly known as:  ARICEPT Take 10 mg by mouth at bedtime.   DULoxetine 60 MG capsule Commonly known as:  CYMBALTA Take 60 mg by mouth daily.   enoxaparin 30 MG/0.3ML injection Commonly known as:  LOVENOX Inject 0.3 mLs (30 mg total) into the skin daily for 10 days. Then take one 81 mg aspirin once a day for three weeks. Then discontinue aspirin.   famotidine 20 MG tablet Commonly known as:  PEPCID Take 1 tablet (20 mg total) by mouth 2 (two) times daily.   feeding supplement (ENSURE ENLIVE) Liqd Take 237 mLs by mouth 2 (two) times daily between meals.   folic acid 1 MG tablet Commonly known as:  FOLVITE Take 1 tablet (1 mg total) by mouth daily.   furosemide 40 MG tablet Commonly known as:  LASIX Take 1 tablet (40 mg total) by mouth daily.   HYDROcodone-acetaminophen 5-325 MG tablet Commonly known as:  NORCO/VICODIN Take 1-2 tablets by mouth every 6 (six) hours as needed for moderate pain.   LORazepam 0.5 MG tablet Commonly known as:  ATIVAN Take 1 tablet (0.5 mg total) by mouth every 8 (eight) hours as needed for anxiety.   methocarbamol 500 MG tablet Commonly known as:  ROBAXIN Take 1 tablet (500 mg total) by mouth every 6 (six) hours as needed for muscle spasms.   metoprolol succinate 25 MG 24 hr tablet Commonly known as:  TOPROL XL Take 1 tablet (25 mg total) by mouth daily. Take with or immediately following a meal.   MOVANTIK 12.5 MG Tabs tablet Generic drug:  naloxegol oxalate Take 1 tablet (12.5 mg  total) by mouth daily.   multivitamin with minerals Tabs tablet Take 1 tablet by mouth daily.   oxyCODONE 20 mg 12 hr tablet Commonly known as:  OXYCONTIN Take 1 tablet (20 mg total) by mouth 2 (two) times daily.   polyethylene glycol packet Commonly known as:  MIRALAX / GLYCOLAX Take 17 g by mouth daily.   sacubitril-valsartan 49-51 MG Commonly known as:  ENTRESTO Take 1 tablet by mouth 2 (two) times daily.   senna-docusate 8.6-50 MG tablet Commonly known as:  Senokot-S Take 2 tablets by mouth 2 (two) times daily.   thiamine 100 MG tablet Take 1 tablet (100 mg total) by mouth daily.   umeclidinium bromide 62.5 MCG/INH Aepb Commonly known as:  INCRUSE ELLIPTA Inhale 1 puff into the lungs daily.   Vitamin D3 1.25 MG (50000 UT) Caps Take 50,000 Units by mouth every Friday.       Past Surgical History:  She  has a past surgical history that includes Breast lumpectomy (1984); Mastectomy (1990); Mastectomy (1996); Cesarean section; Laminectomy; colonoscopy with polypectomy (2011); and Femur IM nail (Left, 09/09/2018).  Family History:  Her family history includes Asthma in her maternal grandmother; Coronary artery disease in her mother; Drug abuse in her brother; Heart Problems in her father.  Social History:  She  reports that she quit smoking about 40 years ago. She has a 45.00 pack-year smoking history. She has never used smokeless tobacco. She reports current alcohol use of about 9.0 standard drinks of alcohol per week. She reports that she does not use drugs.

## 2018-10-27 ENCOUNTER — Ambulatory Visit (INDEPENDENT_AMBULATORY_CARE_PROVIDER_SITE_OTHER): Payer: Commercial Managed Care - PPO | Admitting: Adult Health

## 2018-10-27 ENCOUNTER — Encounter: Payer: Self-pay | Admitting: Adult Health

## 2018-10-27 VITALS — BP 118/68 | HR 85 | Wt 113.0 lb

## 2018-10-27 DIAGNOSIS — J449 Chronic obstructive pulmonary disease, unspecified: Secondary | ICD-10-CM | POA: Insufficient documentation

## 2018-10-27 DIAGNOSIS — J438 Other emphysema: Secondary | ICD-10-CM

## 2018-10-27 DIAGNOSIS — J189 Pneumonia, unspecified organism: Secondary | ICD-10-CM

## 2018-10-27 DIAGNOSIS — J439 Emphysema, unspecified: Secondary | ICD-10-CM | POA: Diagnosis not present

## 2018-10-27 NOTE — Patient Instructions (Signed)
Continue on Incruse 1 puff daily, rinse well after use.  Activity as tolerated.  Aspiration precautions as discussed  Follow up with Dr. Halford Chessman  Or Parrett in 2 months in [facility] and As needed

## 2018-10-27 NOTE — Progress Notes (Signed)
@Patient  ID: Yolanda [name], female    DOB: [birth year], 85 y.o.   MRN: [id number]  Chief Complaint  Patient presents with  . Follow-up    COPD     Referring provider: Javier Glazier, MD  HPI: 85 year old female former smoker (quit 1980 - smoked 1PPD x 74yrs ) seen for pulmonary consult October 01, 2018 for shortness of breath Medical history significant for congestive heart failure, dementia ,CVA -left sided weakness   TEST/EVENTS :  CT chest November 2019 moderate layering right pleural effusion, advanced emphysema, diffuse groundglass attenuation with airspacedisease throughout the right upper lung and right lower lung  Chest x-ray August 31, 2018 small left pleural effusion remaining no consolidation. \  10/27/2018 Follow up : COPD /Emphysema emphysema Patient returns for a one-month follow-up.  Patient was seen last visit for a pulmonary consult.  She recently had a hip fracture and is in assisted living. She has previously been seen by our pulmonary group during hospitalizations in 2018 for a left empyema requiring chest tube drainage and in 2019 for pneumonia and cardiomyopathy.  CT chest showed advanced emphysema.  She has been having shortness of breath with activity.  Last visit patient was suspected to have underlying COPD and emphysema.  She was started on Incruse daily.  Since last visit patient is feeling that her breathing did improve slightly with Incruse. Says she no longer has to used oxygen with rehab which is an improvement .  Spirometry today shows Moderate airflow obstruction . FEV1 62%, ratio 69, FVC 66%.  She continues with rehab , now is able to some weight bearing . She has previous CVA in past with left hemiparesis .   Prior to hip fracture , was at [facility] living /apartment  with 24hr care . Used a walker . Had balance issues.   No Known Allergies  Immunization History  Administered Date(s) Administered  . Influenza Split 07/25/2011  .  Influenza Whole 05/09/2008, 05/24/2009, 05/14/2010  . Influenza, High Dose Seasonal PF 05/31/2018  . Pneumococcal Conjugate-13 05/31/2018    Past Medical History:  Diagnosis Date  . Adenomatous colon polyp    tubular  . Anxiety disorder    panic attacks, PMH of. Dr  Janna Arch  . 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 Diverticulosis   . Empyema ([place])    Left   . Fibroids    Lupron shots  . GERD (gastroesophageal reflux disease)   . High altitude sickness 2008  . Premature menopause    due to Lupron    Tobacco History: Social History   Tobacco Use  Smoking Status Former Smoker  . Packs/day: 1.50  . Years: 30.00  . Pack years: 45.00  . Last attempt to quit: 08/19/1978  . Years since quitting: 40.2  Smokeless Tobacco Never Used  Tobacco Comment   25 years ago as of 2012   Counseling given: Not Answered Comment: 25 years ago as of 2012   Outpatient Medications Prior to Visit  Medication Sig Dispense Refill  . acetaminophen (TYLENOL) 500 MG tablet Take 1,000 mg by mouth every 8 (eight) hours as needed.    . Cholecalciferol (VITAMIN D3) 1.25 MG (50000 UT) CAPS Take 50,000 Units by mouth every Friday.   0  . cyclobenzaprine (FLEXERIL) 5 MG tablet Take 5 mg by mouth 3 (three) times daily as needed for muscle spasms.   0  . donepezil (ARICEPT) 10 MG tablet Take 10 mg  by mouth at bedtime.   1  . DULoxetine (CYMBALTA) 60 MG capsule Take 60 mg by mouth daily.   5  . famotidine (PEPCID) 20 MG tablet Take 1 tablet (20 mg total) by mouth 2 (two) times daily. 60 tablet 0  . feeding supplement, ENSURE ENLIVE, (ENSURE ENLIVE) LIQD Take 237 mLs by mouth 2 (two) times daily between meals. 242 mL 12  . folic acid (FOLVITE) 1 MG tablet Take 1 tablet (1 mg total) by mouth daily. 30 tablet 0  . furosemide (LASIX) 40 MG tablet Take 1 tablet (40 mg total) by mouth daily. (Patient taking differently: Take 40 mg by mouth daily as needed for fluid. ) 60  tablet 0  . HYDROcodone-acetaminophen (NORCO/VICODIN) 5-325 MG tablet Take 1-2 tablets by mouth every 6 (six) hours as needed for moderate pain. 56 tablet 0  . LORazepam (ATIVAN) 0.5 MG tablet Take 1 tablet (0.5 mg total) by mouth every 8 (eight) hours as needed for anxiety. 10 tablet 0  . methocarbamol (ROBAXIN) 500 MG tablet Take 1 tablet (500 mg total) by mouth every 6 (six) hours as needed for muscle spasms. 40 tablet 0  . metoprolol succinate (TOPROL-XL) 25 MG 24 hr tablet Take 1 tablet (25 mg total) by mouth daily. Take with or immediately following a meal. 30 tablet 6  . MOVANTIK 12.5 MG TABS tablet Take 1 tablet (12.5 mg total) by mouth daily. 10 tablet 0  . Multiple Vitamin (MULTIVITAMIN WITH MINERALS) TABS tablet Take 1 tablet by mouth daily. 30 tablet 0  . oxyCODONE (OXYCONTIN) 20 mg 12 hr tablet Take 1 tablet (20 mg total) by mouth 2 (two) times daily. 14 tablet 0  . polyethylene glycol (MIRALAX / GLYCOLAX) packet Take 17 g by mouth daily. 14 each 0  . sacubitril-valsartan (ENTRESTO) 49-51 MG Take 1 tablet by mouth 2 (two) times daily. 60 tablet 0  . senna-docusate (SENOKOT-S) 8.6-50 MG tablet Take 2 tablets by mouth 2 (two) times daily. 20 tablet 0  . thiamine 100 MG tablet Take 1 tablet (100 mg total) by mouth daily. 30 tablet 0  . umeclidinium bromide (INCRUSE ELLIPTA) 62.5 MCG/INH AEPB Inhale 1 puff into the lungs daily. 1 each 5  . enoxaparin (LOVENOX) 30 MG/0.3ML injection Inject 0.3 mLs (30 mg total) into the skin daily for 10 days. Then take one 81 mg aspirin once a day for three weeks. Then discontinue aspirin. 3 mL 0   No facility-administered medications prior to visit.      Review of Systems:   Constitutional:   No  weight loss, night sweats,  Fevers, chills,  +fatigue, or  lassitude.  HEENT:   No headaches,  Difficulty swallowing,  Tooth/dental problems, or  Sore throat,                No sneezing, itching, ear ache, nasal congestion, post nasal drip,   CV:  No  chest pain,  Orthopnea, PND, swelling in lower extremities, anasarca, dizziness, palpitations, syncope.   GI  No heartburn, indigestion, abdominal pain, nausea, vomiting, diarrhea, change in bowel habits, loss of appetite, bloody stools.   Resp: No shortness of breath with exertion or at rest.  No excess mucus, no productive cough,  No non-productive cough,  No coughing up of blood.  No change in color of mucus.  No wheezing.  No chest wall deformity  Skin: no rash or lesions.  GU: no dysuria, change in color of urine, no urgency or frequency.  No flank  pain, no hematuria   MS:  No joint pain or swelling.  No decreased range of motion.  No back pain.    Physical Exam  BP 118/68 (BP Location: Left Arm, Cuff Size: Normal)   Pulse 85   Wt 113 lb (51.3 kg)   SpO2 92%   BMI 19.40 kg/m   GEN: A/Ox3; pleasant , NAD, elderly , frail in wc    HEENT:  /AT,  EACs-clear, TMs-wnl, NOSE-clear, THROAT-clear, no lesions, no postnasal drip or exudate noted.   NECK:  Supple w/ fair ROM; no JVD; normal carotid impulses w/o bruits; no thyromegaly or nodules palpated; no lymphadenopathy.    RESP  Decreased BS in bases . no accessory muscle use, no dullness to percussion  CARD:  RRR, no m/r/g, no peripheral edema, pulses intact, no cyanosis or clubbing.  GI:   Soft & nt; nml bowel sounds; no organomegaly or masses detected.   Musco: Warm bil, no deformities or joint swelling noted.   Neuro: alert, no focal deficits noted.  , left sided weakness noted   Skin: Warm, no lesions or rashes    Lab Results:  CBC    Component Value Date/Time   WBC 9.6 09/12/2018 0555   RBC 3.34 (L) 09/12/2018 0555   HGB 10.1 (L) 09/12/2018 0555   HCT 32.8 (L) 09/12/2018 0555   PLT 288 09/12/2018 0555   MCV 98.2 09/12/2018 0555   MCH 30.2 09/12/2018 0555   MCHC 30.8 09/12/2018 0555   RDW 14.3 09/12/2018 0555   LYMPHSABS 1.8 09/09/2018 0522   MONOABS 1.3 (H) 09/09/2018 0522   EOSABS 0.3 09/09/2018 0522     BASOSABS 0.0 09/09/2018 0522    BMET    Component Value Date/Time   NA 138 09/12/2018 0555   NA 139 07/27/2018 1429   K 4.3 09/12/2018 0555   CL 100 09/12/2018 0555   CO2 33 (H) 09/12/2018 0555   GLUCOSE 97 09/12/2018 0555   BUN 15 09/12/2018 0555   BUN 21 07/27/2018 1429   CREATININE 0.61 09/12/2018 0555   CALCIUM 9.2 09/12/2018 0555   GFRNONAA >60 09/12/2018 0555   GFRAA >60 09/12/2018 0555    BNP    Component Value Date/Time   BNP 862.1 (H) 07/01/2018 1058    ProBNP No results found for: PROBNP  Imaging: No results found.    No flowsheet data found.  No results found for: NITRICOXIDE      Assessment & Plan:   COPD (chronic obstructive pulmonary disease) (HCC) Moderate COPD , improved symptom control with Incruse  At risk for dysphagia/aspiration with previous CVA/age./deconditioning /pain meds.   Plan  Patient Instructions  Continue on Incruse 1 puff daily, rinse well after use.  Activity as tolerated.  Aspiration precautions as discussed  Follow up with Dr. Halford Chessman  Or  in 2 months in [facility] and As needed        HCAP (healthcare-associated pneumonia) PNA 06/2018 - clinically improved and follow up chest xray showed resolution       Rexene Edison, NP 10/27/2018

## 2018-10-27 NOTE — Assessment & Plan Note (Signed)
Moderate COPD , improved symptom control with Incruse  At risk for dysphagia/aspiration with previous CVA/age./deconditioning /pain meds.   Plan  Patient Instructions  Continue on Incruse 1 puff daily, rinse well after use.  Activity as tolerated.  Aspiration precautions as discussed  Follow up with Dr. Halford Chessman  Or Parrett in 2 months in [facility] and As needed

## 2018-10-27 NOTE — Assessment & Plan Note (Signed)
PNA 06/2018 - clinically improved and follow up chest xray showed resolution

## 2018-12-27 ENCOUNTER — Emergency Department (HOSPITAL_COMMUNITY): Payer: Commercial Managed Care - PPO

## 2018-12-27 ENCOUNTER — Encounter (HOSPITAL_COMMUNITY): Payer: Self-pay | Admitting: Emergency Medicine

## 2018-12-27 ENCOUNTER — Inpatient Hospital Stay (HOSPITAL_COMMUNITY)
Admission: EM | Admit: 2018-12-27 | Discharge: 2018-12-30 | DRG: 871 | Disposition: A | Discharge status: Home / Self Care | Payer: Commercial Managed Care - PPO | Attending: Family Medicine | Admitting: Family Medicine

## 2018-12-27 ENCOUNTER — Other Ambulatory Visit: Payer: Self-pay

## 2018-12-27 DIAGNOSIS — Z515 Encounter for palliative care: Secondary | ICD-10-CM | POA: Diagnosis present

## 2018-12-27 DIAGNOSIS — G894 Chronic pain syndrome: Secondary | ICD-10-CM | POA: Diagnosis present

## 2018-12-27 DIAGNOSIS — N390 Urinary tract infection, site not specified: Secondary | ICD-10-CM

## 2018-12-27 DIAGNOSIS — E861 Hypovolemia: Secondary | ICD-10-CM | POA: Diagnosis present

## 2018-12-27 DIAGNOSIS — F410 Panic disorder [episodic paroxysmal anxiety] without agoraphobia: Secondary | ICD-10-CM | POA: Diagnosis present

## 2018-12-27 DIAGNOSIS — G8929 Other chronic pain: Secondary | ICD-10-CM | POA: Diagnosis present

## 2018-12-27 DIAGNOSIS — F0390 Unspecified dementia without behavioral disturbance: Secondary | ICD-10-CM

## 2018-12-27 DIAGNOSIS — Z7401 Bed confinement status: Secondary | ICD-10-CM

## 2018-12-27 DIAGNOSIS — A419 Sepsis, unspecified organism: Secondary | ICD-10-CM | POA: Diagnosis not present

## 2018-12-27 DIAGNOSIS — N3000 Acute cystitis without hematuria: Secondary | ICD-10-CM | POA: Diagnosis present

## 2018-12-27 DIAGNOSIS — Z87891 Personal history of nicotine dependence: Secondary | ICD-10-CM

## 2018-12-27 DIAGNOSIS — I5032 Chronic diastolic (congestive) heart failure: Secondary | ICD-10-CM

## 2018-12-27 DIAGNOSIS — F0391 Unspecified dementia with behavioral disturbance: Secondary | ICD-10-CM | POA: Diagnosis present

## 2018-12-27 DIAGNOSIS — Z1159 Encounter for screening for other viral diseases: Secondary | ICD-10-CM

## 2018-12-27 DIAGNOSIS — R6510 Systemic inflammatory response syndrome (SIRS) of non-infectious origin without acute organ dysfunction: Secondary | ICD-10-CM

## 2018-12-27 DIAGNOSIS — F418 Other specified anxiety disorders: Secondary | ICD-10-CM | POA: Diagnosis present

## 2018-12-27 DIAGNOSIS — G9341 Metabolic encephalopathy: Secondary | ICD-10-CM | POA: Diagnosis present

## 2018-12-27 DIAGNOSIS — Z825 Family history of asthma and other chronic lower respiratory diseases: Secondary | ICD-10-CM

## 2018-12-27 DIAGNOSIS — J439 Emphysema, unspecified: Secondary | ICD-10-CM | POA: Diagnosis not present

## 2018-12-27 DIAGNOSIS — J449 Chronic obstructive pulmonary disease, unspecified: Secondary | ICD-10-CM | POA: Diagnosis present

## 2018-12-27 DIAGNOSIS — Z7189 Other specified counseling: Secondary | ICD-10-CM

## 2018-12-27 DIAGNOSIS — Z8673 Personal history of transient ischemic attack (TIA), and cerebral infarction without residual deficits: Secondary | ICD-10-CM

## 2018-12-27 DIAGNOSIS — H9190 Unspecified hearing loss, unspecified ear: Secondary | ICD-10-CM | POA: Diagnosis present

## 2018-12-27 DIAGNOSIS — Z66 Do not resuscitate: Secondary | ICD-10-CM | POA: Diagnosis present

## 2018-12-27 DIAGNOSIS — Z79891 Long term (current) use of opiate analgesic: Secondary | ICD-10-CM

## 2018-12-27 DIAGNOSIS — Z79899 Other long term (current) drug therapy: Secondary | ICD-10-CM

## 2018-12-27 DIAGNOSIS — Z8249 Family history of ischemic heart disease and other diseases of the circulatory system: Secondary | ICD-10-CM

## 2018-12-27 DIAGNOSIS — D539 Nutritional anemia, unspecified: Secondary | ICD-10-CM

## 2018-12-27 DIAGNOSIS — Z86010 Personal history of colonic polyps: Secondary | ICD-10-CM

## 2018-12-27 LAB — C-REACTIVE PROTEIN: CRP: 27.3 mg/dL — ABNORMAL HIGH (ref <1.0)

## 2018-12-27 LAB — CBC WITH DIFFERENTIAL/PLATELET
Abs Immature Granulocytes: 0.07 10*3/uL (ref 0.00–0.07)
Basophils Absolute: 0 10*3/uL (ref 0.0–0.1)
Basophils Relative: 0 %
Eosinophils Absolute: 0 10*3/uL (ref 0.0–0.5)
Eosinophils Relative: 0 %
HCT: 29.3 % — ABNORMAL LOW (ref 36.0–46.0)
Hemoglobin: 9.2 g/dL — ABNORMAL LOW (ref 12.0–15.0)
Immature Granulocytes: 1 %
Lymphocytes Relative: 10 %
Lymphs Abs: 1.4 10*3/uL (ref 0.7–4.0)
MCH: 31.6 pg (ref 26.0–34.0)
MCHC: 31.4 g/dL (ref 30.0–36.0)
MCV: 100.7 fL — ABNORMAL HIGH (ref 80.0–100.0)
Monocytes Absolute: 1.1 10*3/uL — ABNORMAL HIGH (ref 0.1–1.0)
Monocytes Relative: 8 %
Neutro Abs: 11.3 10*3/uL — ABNORMAL HIGH (ref 1.7–7.7)
Neutrophils Relative %: 81 %
Platelets: 328 10*3/uL (ref 150–400)
RBC: 2.91 MIL/uL — ABNORMAL LOW (ref 3.87–5.11)
RDW: 13 % (ref 11.5–15.5)
WBC: 13.9 10*3/uL — ABNORMAL HIGH (ref 4.0–10.5)
nRBC: 0 % (ref 0.0–0.2)

## 2018-12-27 LAB — LACTIC ACID, PLASMA: Lactic Acid, Venous: 1.2 mmol/L (ref 0.5–1.9)

## 2018-12-27 LAB — COMPREHENSIVE METABOLIC PANEL
ALT: 43 U/L (ref 0–44)
AST: 8 U/L — ABNORMAL LOW (ref 15–41)
Albumin: 2.6 g/dL — ABNORMAL LOW (ref 3.5–5.0)
Alkaline Phosphatase: 202 U/L — ABNORMAL HIGH (ref 38–126)
Anion gap: 12 (ref 5–15)
BUN: 11 mg/dL (ref 8–23)
CO2: 28 mmol/L (ref 22–32)
Calcium: 9 mg/dL (ref 8.9–10.3)
Chloride: 93 mmol/L — ABNORMAL LOW (ref 98–111)
Creatinine, Ser: 0.71 mg/dL (ref 0.44–1.00)
GFR calc Af Amer: >60 mL/min (ref >60)
GFR calc non Af Amer: >60 mL/min (ref >60)
Glucose, Bld: 119 mg/dL — ABNORMAL HIGH (ref 70–99)
Potassium: 4.2 mmol/L (ref 3.5–5.1)
Sodium: 133 mmol/L — ABNORMAL LOW (ref 135–145)
Total Bilirubin: 0.9 mg/dL (ref 0.3–1.2)
Total Protein: 6.8 g/dL (ref 6.5–8.1)

## 2018-12-27 LAB — URINALYSIS, COMPLETE (UACMP) WITH MICROSCOPIC
Bilirubin Urine: NEGATIVE
Glucose, UA: NEGATIVE mg/dL
Hgb urine dipstick: NEGATIVE
Ketones, ur: 5 mg/dL — AB
Leukocytes,Ua: NEGATIVE
Nitrite: NEGATIVE
Protein, ur: 30 mg/dL — AB
Specific Gravity, Urine: 1.018 (ref 1.005–1.030)
pH: 8 (ref 5.0–8.0)

## 2018-12-27 LAB — SARS CORONAVIRUS 2 BY RT PCR (HOSPITAL ORDER, PERFORMED IN ~~LOC~~ HOSPITAL LAB): SARS Coronavirus 2: NEGATIVE

## 2018-12-27 LAB — LACTATE DEHYDROGENASE: LDH: 185 U/L (ref 98–192)

## 2018-12-27 LAB — D-DIMER, QUANTITATIVE: D-Dimer, Quant: 3.82 ug/mL-FEU — ABNORMAL HIGH (ref 0.00–0.50)

## 2018-12-27 LAB — BRAIN NATRIURETIC PEPTIDE: B Natriuretic Peptide: 38.8 pg/mL (ref 0.0–100.0)

## 2018-12-27 LAB — FIBRINOGEN: Fibrinogen: >800 mg/dL — ABNORMAL HIGH (ref 210–475)

## 2018-12-27 LAB — TRIGLYCERIDES: Triglycerides: 73 mg/dL (ref <150)

## 2018-12-27 LAB — TROPONIN I
Troponin I: 0.04 ng/mL (ref <0.03)
Troponin I: 0.04 ng/mL (ref <0.03)

## 2018-12-27 LAB — PROCALCITONIN: Procalcitonin: <0.1 ng/mL

## 2018-12-27 LAB — FERRITIN: Ferritin: 304 ng/mL (ref 11–307)

## 2018-12-27 MED ORDER — CYCLOBENZAPRINE HCL 10 MG PO TABS
5.0000 mg | ORAL_TABLET | Freq: Three times a day (TID) | ORAL | Status: DC | PRN
  Administered 2018-12-30: 5 mg via ORAL
  Filled 2018-12-27: qty 1

## 2018-12-27 MED ORDER — SODIUM CHLORIDE 0.9 % IV SOLN
INTRAVENOUS | Status: AC
  Administered 2018-12-27: 23:00:00 via INTRAVENOUS

## 2018-12-27 MED ORDER — POLYETHYLENE GLYCOL 3350 17 G PO PACK
17.0000 g | PACK | Freq: Every day | ORAL | Status: DC
  Administered 2018-12-28 – 2018-12-30 (×3): 17 g via ORAL
  Filled 2018-12-27 (×3): qty 1

## 2018-12-27 MED ORDER — DULOXETINE HCL 30 MG PO CPEP
60.0000 mg | ORAL_CAPSULE | Freq: Every day | ORAL | Status: DC
  Administered 2018-12-28 – 2018-12-30 (×3): 60 mg via ORAL
  Filled 2018-12-27 (×3): qty 2

## 2018-12-27 MED ORDER — SODIUM CHLORIDE 0.9% FLUSH: 3.0000 mL | INTRAVENOUS | Status: DC | PRN

## 2018-12-27 MED ORDER — SODIUM CHLORIDE 0.9 % IV BOLUS
500.0000 mL | Freq: Once | INTRAVENOUS | Status: AC
  Administered 2018-12-27: 500 mL via INTRAVENOUS

## 2018-12-27 MED ORDER — ACETAMINOPHEN 500 MG PO TABS
1000.0000 mg | ORAL_TABLET | Freq: Once | ORAL | Status: AC
  Administered 2018-12-27: 1000 mg via ORAL
  Filled 2018-12-27: qty 2

## 2018-12-27 MED ORDER — SODIUM CHLORIDE 0.9 % IV SOLN
1000.0000 mL | INTRAVENOUS | Status: DC
  Administered 2018-12-27: 1000 mL via INTRAVENOUS

## 2018-12-27 MED ORDER — ACETAMINOPHEN 325 MG PO TABS: 650.0000 mg | ORAL_TABLET | Freq: Four times a day (QID) | ORAL | Status: DC | PRN

## 2018-12-27 MED ORDER — ALBUTEROL SULFATE (2.5 MG/3ML) 0.083% IN NEBU: 2.5000 mg | INHALATION_SOLUTION | Freq: Four times a day (QID) | RESPIRATORY_TRACT | Status: DC | PRN

## 2018-12-27 MED ORDER — DONEPEZIL HCL 5 MG PO TABS
10.0000 mg | ORAL_TABLET | Freq: Every day | ORAL | Status: DC
  Administered 2018-12-27 – 2018-12-28 (×2): 10 mg via ORAL
  Filled 2018-12-27 (×3): qty 2

## 2018-12-27 MED ORDER — LORAZEPAM 0.5 MG PO TABS: 0.5000 mg | ORAL_TABLET | Freq: Three times a day (TID) | ORAL | Status: DC | PRN

## 2018-12-27 MED ORDER — SODIUM CHLORIDE 0.9% FLUSH
3.0000 mL | Freq: Two times a day (BID) | INTRAVENOUS | Status: DC
  Administered 2018-12-27 – 2018-12-30 (×3): 3 mL via INTRAVENOUS

## 2018-12-27 MED ORDER — ACETAMINOPHEN 650 MG RE SUPP: 650.0000 mg | Freq: Four times a day (QID) | RECTAL | Status: DC | PRN

## 2018-12-27 MED ORDER — ONDANSETRON HCL 4 MG/2ML IJ SOLN: 4.0000 mg | Freq: Four times a day (QID) | INTRAMUSCULAR | Status: DC | PRN

## 2018-12-27 MED ORDER — SODIUM CHLORIDE 0.9 % IV SOLN
1.0000 g | Freq: Once | INTRAVENOUS | Status: AC
  Administered 2018-12-27: 1 g via INTRAVENOUS
  Filled 2018-12-27: qty 10

## 2018-12-27 MED ORDER — SODIUM CHLORIDE 0.9 % IV SOLN
250.0000 mL | INTRAVENOUS | Status: DC | PRN
  Administered 2018-12-28: 250 mL via INTRAVENOUS

## 2018-12-27 MED ORDER — ENSURE ENLIVE PO LIQD
237.0000 mL | Freq: Two times a day (BID) | ORAL | Status: DC
  Administered 2018-12-30 (×2): 237 mL via ORAL

## 2018-12-27 MED ORDER — NALOXEGOL OXALATE 12.5 MG PO TABS
12.5000 mg | ORAL_TABLET | Freq: Every day | ORAL | Status: DC
  Administered 2018-12-28 – 2018-12-30 (×3): 12.5 mg via ORAL
  Filled 2018-12-27 (×3): qty 1

## 2018-12-27 MED ORDER — SENNOSIDES-DOCUSATE SODIUM 8.6-50 MG PO TABS
2.0000 | ORAL_TABLET | Freq: Two times a day (BID) | ORAL | Status: DC
  Administered 2018-12-27 – 2018-12-30 (×5): 2 via ORAL
  Filled 2018-12-27 (×6): qty 2

## 2018-12-27 MED ORDER — SODIUM CHLORIDE 0.9 % IV SOLN
1.0000 g | INTRAVENOUS | Status: DC
  Administered 2018-12-28 – 2018-12-30 (×3): 1 g via INTRAVENOUS
  Filled 2018-12-27 (×3): qty 1

## 2018-12-27 MED ORDER — OXYCODONE HCL ER 10 MG PO T12A
20.0000 mg | EXTENDED_RELEASE_TABLET | Freq: Two times a day (BID) | ORAL | Status: DC
  Administered 2018-12-27 – 2018-12-30 (×5): 20 mg via ORAL
  Filled 2018-12-27 (×6): qty 2

## 2018-12-27 MED ORDER — HYDROCODONE-ACETAMINOPHEN 5-325 MG PO TABS
1.0000 | ORAL_TABLET | Freq: Four times a day (QID) | ORAL | Status: DC | PRN
  Administered 2018-12-27: 2 via ORAL
  Administered 2018-12-30: 1 via ORAL
  Filled 2018-12-27: qty 2
  Filled 2018-12-27: qty 1

## 2018-12-27 MED ORDER — FAMOTIDINE 20 MG PO TABS
20.0000 mg | ORAL_TABLET | Freq: Two times a day (BID) | ORAL | Status: DC
  Administered 2018-12-27 – 2018-12-30 (×5): 20 mg via ORAL
  Filled 2018-12-27 (×6): qty 1

## 2018-12-27 MED ORDER — ENOXAPARIN SODIUM 40 MG/0.4ML ~~LOC~~ SOLN
40.0000 mg | Freq: Every day | SUBCUTANEOUS | Status: DC
  Administered 2018-12-27 – 2018-12-28 (×2): 40 mg via SUBCUTANEOUS
  Filled 2018-12-27 (×3): qty 0.4

## 2018-12-27 MED ORDER — ONDANSETRON HCL 4 MG PO TABS: 4.0000 mg | ORAL_TABLET | Freq: Four times a day (QID) | ORAL | Status: DC | PRN

## 2018-12-27 NOTE — ED Notes (Signed)
ED TO INPATIENT HANDOFF REPORT  ED Nurse Name and Phone #: Nehemiah Settle RN  S Name/Age/Gender Yolanda [name] 85 y.o. female Room/Bed: WA01/WA01  Code Status   Code Status: DNR  Home/SNF/Other Skilled nursing facility Patient oriented to: self and place Is this baseline? Yes   Triage Complete: Triage complete  Chief Complaint UTI; fever  Triage Note Per PTAR, patient from [facility], UTI x4 days. Taking oral abx at facility. Sent for evaluation of fever. Hx dementia. Disoriented x4 at baseline. Nonambulatory.   Allergies No Known Allergies  Level of Care/Admitting Diagnosis ED Disposition    ED Disposition Condition Comment   Admit  Hospital Area: [place] [[id number]]  Level of Care: Telemetry [5]  Admit to tele based on following criteria: Monitor for Ischemic changes  Covid Evaluation: N/A  Diagnosis: Sepsis secondary to UTI [facility]) [[id number]]  Admitting Physician: Vianne Bulls [[id number]]  Attending Physician: Vianne Bulls [[id number]]  PT Class (Do Not Modify): Observation [104]  PT Acc Code (Do Not Modify): Observation [10022]       B Medical/Surgery History Past Medical History:  Diagnosis Date  . Adenomatous colon polyp    tubular  . Anxiety disorder    panic attacks, PMH of. Dr  Janna Arch  . 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 Diverticulosis   . Empyema ([place])    Left   . Fibroids    Lupron shots  . GERD (gastroesophageal reflux disease)   . High altitude sickness 2008  . Premature menopause    due to Lupron   Past Surgical History:  Procedure Laterality Date  . BREAST LUMPECTOMY  1984   radiation , R breast  . CESAREAN SECTION     X 2  . colonoscopy with polypectomy  2011   Dr  Delfin Edis  . FEMUR IM NAIL Left 09/09/2018   Procedure: INTRAMEDULLARY (IM) NAIL FEMORAL;  Surgeon: Gaynelle Arabian, MD;  Location: [facility];  Service: Orthopedics;  Laterality: Left;  60 minutes Ok per  [place] to follow himself  . LAMINECTOMY     X3 L-Sspine  . MASTECTOMY  1990   R breast  . MASTECTOMY  1996   L     A IV Location/Drains/Wounds Patient Lines/Drains/Airways Status   Active Line/Drains/Airways    Name:   Placement date:   Placement time:   Site:   Days:   Peripheral IV 12/27/18 Right Antecubital   12/27/18    1720    Antecubital   less than 1   Peripheral IV 12/27/18 Left Hand   12/27/18    1730    Hand   less than 1   External Urinary Catheter   09/10/18    1900    -   108   Incision (Closed) 09/09/18 Hip Left   09/09/18    1832     109          Intake/Output Last 24 hours No intake or output data in the 24 hours ending 12/27/18 2048  Labs/Imaging Results for orders placed or performed during the hospital encounter of 12/27/18 (from the past 48 hour(s))  CBC WITH DIFFERENTIAL     Status: Abnormal   Collection Time: 12/27/18  5:04 PM  Result Value Ref Range   WBC 13.9 (H) 4.0 - 10.5 K/uL   RBC 2.91 (L) 3.87 - 5.11 MIL/uL   Hemoglobin 9.2 (L) 12.0 - 15.0 g/dL   HCT 29.3 (  L) 36.0 - 46.0 %   MCV 100.7 (H) 80.0 - 100.0 fL   MCH 31.6 26.0 - 34.0 pg   MCHC 31.4 30.0 - 36.0 g/dL   RDW 13.0 11.5 - 15.5 %   Platelets 328 150 - 400 K/uL   nRBC 0.0 0.0 - 0.2 %   Neutrophils Relative % 81 %   Neutro Abs 11.3 (H) 1.7 - 7.7 K/uL   Lymphocytes Relative 10 %   Lymphs Abs 1.4 0.7 - 4.0 K/uL   Monocytes Relative 8 %   Monocytes Absolute 1.1 (H) 0.1 - 1.0 K/uL   Eosinophils Relative 0 %   Eosinophils Absolute 0.0 0.0 - 0.5 K/uL   Basophils Relative 0 %   Basophils Absolute 0.0 0.0 - 0.1 K/uL   Immature Granulocytes 1 %   Abs Immature Granulocytes 0.07 0.00 - 0.07 K/uL    Comment: Performed at [facility], [place] [address] [place], [place] [address]  Comprehensive metabolic panel     Status: Abnormal   Collection Time: 12/27/18  5:04 PM  Result Value Ref Range   Sodium 133 (L) 135 - 145 mmol/L   Potassium 4.2 3.5 - 5.1 mmol/L   Chloride 93 (L)  98 - 111 mmol/L   CO2 28 22 - 32 mmol/L   Glucose, Bld 119 (H) 70 - 99 mg/dL   BUN 11 8 - 23 mg/dL   Creatinine, Ser 0.71 0.44 - 1.00 mg/dL   Calcium 9.0 8.9 - 10.3 mg/dL   Total Protein 6.8 6.5 - 8.1 g/dL   Albumin 2.6 (L) 3.5 - 5.0 g/dL   AST 8 (L) 15 - 41 U/L   ALT 43 0 - 44 U/L   Alkaline Phosphatase 202 (H) 38 - 126 U/L   Total Bilirubin 0.9 0.3 - 1.2 mg/dL   GFR calc non Af Amer >60 >60 mL/min   GFR calc Af Amer >60 >60 mL/min   Anion gap 12 5 - 15    Comment: Performed at [facility], [place] [address], [place], [place] [address]  D-dimer, quantitative     Status: Abnormal   Collection Time: 12/27/18  5:04 PM  Result Value Ref Range   D-Dimer, Quant 3.82 (H) 0.00 - 0.50 ug/mL-FEU    Comment: (NOTE) At the manufacturer cut-off of 0.50 ug/mL FEU, this assay has been documented to exclude PE with a sensitivity and negative predictive value of 97 to 99%.  At this time, this assay has not been approved by the FDA to exclude DVT/VTE. Results should be correlated with clinical presentation. Performed at [facility], [place] [address], [place], [place] [address]   Procalcitonin     Status: None   Collection Time: 12/27/18  5:04 PM  Result Value Ref Range   Procalcitonin <0.10 ng/mL    Comment:        Interpretation: PCT (Procalcitonin) <= 0.5 ng/mL: Systemic infection (sepsis) is not likely. Local bacterial infection is possible. (NOTE)       Sepsis PCT Algorithm           Lower Respiratory Tract                                      Infection PCT Algorithm    ----------------------------     ----------------------------         PCT < 0.25 ng/mL  PCT < 0.10 ng/mL         Strongly encourage             Strongly discourage   discontinuation of antibiotics    initiation of antibiotics    ----------------------------     -----------------------------       PCT 0.25 - 0.50 ng/mL            PCT 0.10 - 0.25 ng/mL               OR        >80% decrease in PCT            Discourage initiation of                                            antibiotics      Encourage discontinuation           of antibiotics    ----------------------------     -----------------------------         PCT >= 0.50 ng/mL              PCT 0.26 - 0.50 ng/mL               AND        <80% decrease in PCT             Encourage initiation of                                             antibiotics       Encourage continuation           of antibiotics    ----------------------------     -----------------------------        PCT >= 0.50 ng/mL                  PCT > 0.50 ng/mL               AND         increase in PCT                  Strongly encourage                                      initiation of antibiotics    Strongly encourage escalation           of antibiotics                                     -----------------------------                                           PCT <= 0.25 ng/mL                                                 OR                                        >  80% decrease in PCT                                     Discontinue / Do not initiate                                             antibiotics Performed at [place] [address], [place], [place] [address]   Lactate dehydrogenase     Status: None   Collection Time: 12/27/18  5:04 PM  Result Value Ref Range   LDH 185 98 - 192 U/L    Comment: Performed at [facility], [place] [address], [place], [place] [address]  Ferritin     Status: None   Collection Time: 12/27/18  5:04 PM  Result Value Ref Range   Ferritin 304 11 - 307 ng/mL    Comment: Performed at [facility], [place] [address], [place], [place] [address]  Triglycerides     Status: None   Collection Time: 12/27/18  5:04 PM  Result Value Ref Range   Triglycerides 73 <150 mg/dL    Comment: Performed at [facility], [place] [address] [place], [place] [address]  Fibrinogen     Status: Abnormal   Collection Time: 12/27/18  5:04 PM  Result Value Ref Range   Fibrinogen >800 (H) 210 - 475 mg/dL    Comment: Performed at [facility], [place] [address], [place], [place] [address]  C-reactive protein     Status: Abnormal   Collection Time: 12/27/18  5:04 PM  Result Value Ref Range   CRP 27.3 (H) <1.0 mg/dL    Comment: Performed at [facility], [place] [address] [place], [place] [address]  Brain natriuretic peptide     Status: None   Collection Time: 12/27/18  5:04 PM  Result Value Ref Range   B Natriuretic Peptide 38.8 0.0 - 100.0 pg/mL    Comment: Performed at [facility], [place] [address], [place], [place] [address]  Troponin I - Once     Status: Abnormal   Collection Time: 12/27/18  5:04 PM  Result Value Ref Range   Troponin I 0.04 (HH) <0.03 ng/mL    Comment: CRITICAL RESULT CALLED TO, READ BACK BY AND VERIFIED WITH: C.Latrice Storlie,RN [id number] @1822  BY V.WILKINS Performed at [place] [address], [place], [place] [address]   SARS Coronavirus 2 [facility] order, Performed in [facility] hospital lab)     Status: None   Collection Time: 12/27/18  5:30 PM  Result Value Ref Range   SARS Coronavirus 2 NEGATIVE NEGATIVE    Comment: (NOTE) If result is NEGATIVE SARS-CoV-2 target nucleic acids are NOT DETECTED. The SARS-CoV-2 RNA is generally detectable in upper and lower  respiratory specimens during the acute phase of infection. The lowest  concentration of SARS-CoV-2 viral copies this assay can detect is 250  copies / mL. A negative result does not preclude SARS-CoV-2 infection  and should not be used as the sole basis for treatment or other  patient management decisions.  A negative result may occur with  improper specimen collection / handling, submission of specimen other  than nasopharyngeal swab, presence of viral mutation(s) within the   areas targeted by this  assay, and inadequate number of viral copies  (<250 copies / mL). A negative result must be combined with clinical  observations, patient history, and epidemiological information. If result is POSITIVE SARS-CoV-2 target nucleic acids are DETECTED. The SARS-CoV-2 RNA is generally detectable in upper and lower  respiratory specimens dur ing the acute phase of infection.  Positive  results are indicative of active infection with SARS-CoV-2.  Clinical  correlation with patient history and other diagnostic information is  necessary to determine patient infection status.  Positive results do  not rule out bacterial infection or co-infection with other viruses. If result is PRESUMPTIVE POSTIVE SARS-CoV-2 nucleic acids MAY BE PRESENT.   A presumptive positive result was obtained on the submitted specimen  and confirmed on repeat testing.  While 2019 novel coronavirus  (SARS-CoV-2) nucleic acids may be present in the submitted sample  additional confirmatory testing may be necessary for epidemiological  and / or clinical management purposes  to differentiate between  SARS-CoV-2 and other Sarbecovirus currently known to infect humans.  If clinically indicated additional testing with an alternate test  methodology [id number]) is advised. The SARS-CoV-2 RNA is generally  detectable in upper and lower respiratory sp ecimens during the acute  phase of infection. The expected result is Negative. Fact Sheet for Patients:  StrictlyIdeas.no Fact Sheet for Healthcare Providers: BankingDealers.co.za This test is not yet approved or cleared by the [place] FDA and has been authorized for detection and/or diagnosis of SARS-CoV-2 by FDA under an Emergency Use Authorization (EUA).  This EUA will remain in effect (meaning this test can be used) for the duration of the COVID-19 declaration under Section 564(b)(1) of the Act, 21  U.S.C. section 360bbb-3(b)(1), unless the authorization is terminated or revoked sooner. Performed at [facility], [place] [address], [place], [place] [address]   Lactic acid, plasma     Status: None   Collection Time: 12/27/18  5:30 PM  Result Value Ref Range   Lactic Acid, Venous 1.2 0.5 - 1.9 mmol/L    Comment: Performed at [facility], [place] [address] [place], [place] [address]   Dg Chest Port 1 View  Result Date: 12/27/2018 CLINICAL DATA:  Fever.  Altered mental status. EXAM: PORTABLE CHEST 1 VIEW COMPARISON:  09/07/2018 and prior radiographs FINDINGS: Mild cardiomegaly again noted. There is no evidence of focal airspace disease, pulmonary edema, suspicious pulmonary nodule/mass, pleural effusion, or pneumothorax. No acute bony abnormalities are identified. Thoracic stimulator, severe degenerative changes in the LEFT shoulder and bilateral axillary surgical clips again noted. IMPRESSION: Mild cardiomegaly without evidence of acute cardiopulmonary disease. Electronically Signed   By: Margarette Canada M.D.   On: 12/27/2018 17:50    Pending Labs Unresulted Labs (From admission, onward)    Start     Ordered   01/03/19 0500  Creatinine, serum  (enoxaparin (LOVENOX)    CrCl >/= 30 ml/min)  Weekly,   R    Comments:  while on enoxaparin therapy    12/27/18 1956   12/28/18 1937  Basic metabolic panel  Tomorrow morning,   R     12/27/18 1956   12/28/18 0500  CBC WITH DIFFERENTIAL  Tomorrow morning,   R     12/27/18 1956   12/27/18 2002  Troponin I - Once  Once,   R     12/27/18 2001   12/27/18 1956  Urine culture  ONCE - STAT,   R     12/27/18 1956   12/27/18 1956  Urinalysis, Complete w Microscopic  ONCE - STAT,   R     12/27/18 1956   12/27/18 1704  Blood Culture (routine x 2)  BLOOD CULTURE X 2,   STAT     12/27/18 1704          Vitals/Pain Today's Vitals   12/27/18 1930 12/27/18 1945 12/27/18 2015 12/27/18 2030  BP: (!) 110/57 (!) 109/56 (!)  109/57 (!) 102/56  Pulse: 85 88 82 79  Resp: 17 19 16 11   Temp:      TempSrc:      SpO2: 99% 99% 99% 99%    Isolation Precautions No active isolations  Medications Medications  oxyCODONE (OXYCONTIN) 12 hr tablet 20 mg (has no administration in time range)  HYDROcodone-acetaminophen (NORCO/VICODIN) 5-325 MG per tablet 1-2 tablet (has no administration in time range)  donepezil (ARICEPT) tablet 10 mg (has no administration in time range)  DULoxetine (CYMBALTA) DR capsule 60 mg (has no administration in time range)  LORazepam (ATIVAN) tablet 0.5 mg (has no administration in time range)  famotidine (PEPCID) tablet 20 mg (has no administration in time range)  naloxegol oxalate (MOVANTIK) tablet 12.5 mg (has no administration in time range)  polyethylene glycol (MIRALAX / GLYCOLAX) packet 17 g (has no administration in time range)  senna-docusate (Senokot-S) tablet 2 tablet (has no administration in time range)  cyclobenzaprine (FLEXERIL) tablet 5 mg (has no administration in time range)  feeding supplement (ENSURE ENLIVE) (ENSURE ENLIVE) liquid 237 mL (has no administration in time range)  enoxaparin (LOVENOX) injection 40 mg (has no administration in time range)  sodium chloride flush (NS) 0.9 % injection 3 mL (has no administration in time range)  sodium chloride flush (NS) 0.9 % injection 3 mL (has no administration in time range)  sodium chloride flush (NS) 0.9 % injection 3 mL (has no administration in time range)  0.9 %  sodium chloride infusion (has no administration in time range)  acetaminophen (TYLENOL) tablet 650 mg (has no administration in time range)    Or  acetaminophen (TYLENOL) suppository 650 mg (has no administration in time range)  ondansetron (ZOFRAN) tablet 4 mg (has no administration in time range)    Or  ondansetron (ZOFRAN) injection 4 mg (has no administration in time range)  cefTRIAXone (ROCEPHIN) 1 g in sodium chloride 0.9 % 100 mL IVPB (has no administration  in time range)  sodium chloride 0.9 % bolus 500 mL (has no administration in time range)  albuterol (PROVENTIL) (2.5 MG/3ML) 0.083% nebulizer solution 2.5 mg (has no administration in time range)  0.9 %  sodium chloride infusion (has no administration in time range)  cefTRIAXone (ROCEPHIN) 1 g in sodium chloride 0.9 % 100 mL IVPB (0 g Intravenous Stopped 12/27/18 1856)  acetaminophen (TYLENOL) tablet 1,000 mg (1,000 mg Oral Given 12/27/18 1752)    Mobility non-ambulatory     Focused Assessments    R Recommendations: See Admitting Provider Note  Report given to:   Additional Notes:

## 2018-12-27 NOTE — ED Notes (Signed)
Bed: WA01 Expected date:  Expected time:  Means of arrival:  Comments: 85 yo fever/UTI?

## 2018-12-27 NOTE — ED Notes (Signed)
XR at bedside

## 2018-12-27 NOTE — ED Notes (Signed)
Patient's son, Ilona Sorrel, [phone]

## 2018-12-27 NOTE — ED Triage Notes (Addendum)
Per PTAR, patient from [facility], UTI x4 days. Taking oral abx at facility. Sent for evaluation of fever. Hx dementia. Disoriented x4 at baseline. Nonambulatory.

## 2018-12-27 NOTE — ED Notes (Addendum)
Date and time results received: 12/27/18 6:23 PM   Test: Troponin Critical Value: 0.04  Name of Provider Notified: Pfeiffer  Orders Received? Or Actions Taken?: awaiting further orders

## 2018-12-27 NOTE — H&P (Addendum)
History and Physical    LAUNA GOEDKEN BMW:[id number] DOB: 1934-06-29 DOA: 12/27/2018  PCP: Javier Glazier, MD   Patient coming from: SNF  Chief Complaint: Fevers, being treated for uti  HPI: Yolanda [name] is a 85 y.o. female with medical history significant for history of CVA, dementia with behavioral disturbance, emphysema, chronic pain, and chronic diastolic CHF, now presenting to the emergency department from her SNF for evaluation of fevers.  Patient was diagnosed with UTI recently at her facility, has been on an oral antibiotic for the past 4 days, but was noted to be persistently febrile and sent to the ED for further evaluation.  She is disoriented, and per report of SNF she is disoriented and nonambulatory at her baseline.  Patient is unable to contribute to the history.   ED Course: Upon arrival to the ED, patient is found to be febrile to 39 C with systolic blood pressure in the mid 90s to low 100s.  Chest x-ray is negative for acute cardiopulmonary disease.  Chemistry panel is notable for sodium of 133 and alkaline phosphatase of 202.  CBC features a leukocytosis to 13,900 and a chronic anemia with hemoglobin 9.2.  Lactic acid is reassuringly normal, BNP is normal, and troponin slightly elevated at 0.04.  COVID-19 screening is negative. Patient was treated with acetaminophen, 1 g IV Rocephin, and IV fluids in the ED.  She has defervesced, systolic blood pressure remains in the low 100s, and she will be observed in the hospital for ongoing evaluation and management of suspected UTI with failure of outpatient treatment.  Review of Systems:  Unable to complete ROS secondary to the patient's clinical condition.  Past Medical History:  Diagnosis Date  . Adenomatous colon polyp    tubular  . Anxiety disorder    panic attacks, PMH of. Dr  Janna Arch  . 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 Diverticulosis   . Empyema ([place])    Left   .  Fibroids    Lupron shots  . GERD (gastroesophageal reflux disease)   . High altitude sickness 2008  . Premature menopause    due to Lupron    Past Surgical History:  Procedure Laterality Date  . BREAST LUMPECTOMY  1984   radiation , R breast  . CESAREAN SECTION     X 2  . colonoscopy with polypectomy  2011   Dr  Delfin Edis  . FEMUR IM NAIL Left 09/09/2018   Procedure: INTRAMEDULLARY (IM) NAIL FEMORAL;  Surgeon: Gaynelle Arabian, MD;  Location: [facility];  Service: Orthopedics;  Laterality: Left;  60 minutes Ok per Rosa to follow himself  . LAMINECTOMY     X3 L-Sspine  . MASTECTOMY  1990   R breast  . MASTECTOMY  1996   L     reports that she quit smoking about 40 years ago. She has a 45.00 pack-year smoking history. She has never used smokeless tobacco. She reports current alcohol use of about 9.0 standard drinks of alcohol per week. She reports that she does not use drugs.  No Known Allergies  Family History  Problem Relation Age of Onset  . Asthma Maternal Grandmother   . Coronary artery disease Mother        dysrrhythmia  . Drug abuse Brother   . Heart Problems Father   . Cancer Neg Hx      Prior to Admission medications   Medication Sig Start Date End  Date Taking? Authorizing Provider  acetaminophen (TYLENOL) 500 MG tablet Take 1,000 mg by mouth every 8 (eight) hours as needed.   Yes [provider]  Cholecalciferol (VITAMIN D3) 1.25 MG (50000 UT) CAPS Take 50,000 Units by mouth every Friday.  05/07/18  Yes [provider]  cyclobenzaprine (FLEXERIL) 5 MG tablet Take 5 mg by mouth 3 (three) times daily as needed for muscle spasms.  03/30/18  Yes [provider]  donepezil (ARICEPT) 10 MG tablet Take 10 mg by mouth at bedtime.  06/19/18  Yes [provider]  DULoxetine (CYMBALTA) 60 MG capsule Take 60 mg by mouth daily.   Yes [provider]  famotidine (PEPCID) 20 MG tablet Take 1 tablet (20 mg total) by mouth 2 (two) times  daily. 02/20/17  Yes Robbie Lis, MD  feeding supplement, ENSURE ENLIVE, (ENSURE ENLIVE) LIQD Take 237 mLs by mouth 2 (two) times daily between meals. 02/20/17  Yes Robbie Lis, MD  folic acid (FOLVITE) 1 MG tablet Take 1 tablet (1 mg total) by mouth daily. 02/20/17  Yes Robbie Lis, MD  furosemide (LASIX) 40 MG tablet Take 1 tablet (40 mg total) by mouth daily. Patient taking differently: Take 40 mg by mouth daily as needed for fluid.  07/27/18  Yes Barrett, Evelene Croon, PA-C  HYDROcodone-acetaminophen (NORCO/VICODIN) 5-325 MG tablet Take 1-2 tablets by mouth every 6 (six) hours as needed for moderate pain. 09/10/18  Yes Edmisten, Kristie L, PA  LORazepam (ATIVAN) 0.5 MG tablet Take 1 tablet (0.5 mg total) by mouth every 8 (eight) hours as needed for anxiety. 09/11/18  Yes Kayleen Memos, DO  methocarbamol (ROBAXIN) 500 MG tablet Take 1 tablet (500 mg total) by mouth every 6 (six) hours as needed for muscle spasms. 09/10/18  Yes Edmisten, Kristie L, PA  metoprolol succinate (TOPROL-XL) 25 MG 24 hr tablet Take 1 tablet (25 mg total) by mouth daily. Take with or immediately following a meal. 08/31/18  Yes Lendon Colonel, NP  MOVANTIK 12.5 MG TABS tablet Take 1 tablet (12.5 mg total) by mouth daily. 07/05/18  Yes Dhungel, Nishant, MD  Multiple Vitamin (MULTIVITAMIN WITH MINERALS) TABS tablet Take 1 tablet by mouth daily. 02/20/17  Yes Robbie Lis, MD  oxyCODONE (OXYCONTIN) 20 mg 12 hr tablet Take 1 tablet (20 mg total) by mouth 2 (two) times daily. 09/11/18  Yes Hall, Archie Patten N, DO  polyethylene glycol (MIRALAX / GLYCOLAX) packet Take 17 g by mouth daily. 09/11/18  Yes Hall, Carole N, DO  sacubitril-valsartan (ENTRESTO) 49-51 MG Take 1 tablet by mouth 2 (two) times daily. 07/05/18  Yes Dhungel, Nishant, MD  senna-docusate (SENOKOT-S) 8.6-50 MG tablet Take 2 tablets by mouth 2 (two) times daily. 09/11/18  Yes Irene Pap N, DO  thiamine 100 MG tablet Take 1 tablet (100 mg total) by mouth daily. 02/20/17  Yes  Robbie Lis, MD  enoxaparin (LOVENOX) 30 MG/0.3ML injection Inject 0.3 mLs (30 mg total) into the skin daily for 10 days. Then take one 81 mg aspirin once a day for three weeks. Then discontinue aspirin. Patient not taking: Reported on 12/27/2018 09/10/18 09/20/18  Edmisten, Drue Dun L, PA  umeclidinium bromide (INCRUSE ELLIPTA) 62.5 MCG/INH AEPB Inhale 1 puff into the lungs daily. Patient not taking: Reported on 12/27/2018 10/01/18   Chesley Mires, MD    Physical Exam: Vitals:   12/27/18 1900 12/27/18 1915 12/27/18 1930 12/27/18 1945  BP: (!) 113/55 (!) 113/57 (!) 110/57 (!) 109/56  Pulse: 85  86 85 88  Resp: 19 17 17 19   Temp:      TempSrc:      SpO2: 99% 99% 99% 99%    Constitutional: NAD, calm  Eyes: PERTLA, lids and conjunctivae normal ENMT: Mucous membranes are moist. Posterior pharynx clear of any exudate or lesions.   Neck: normal, supple, no masses, no thyromegaly Respiratory: clear to auscultation bilaterally, no wheezing, no crackles. No accessory muscle use.  Cardiovascular: S1 & S2 heard, regular rate and rhythm. No extremity edema.   Abdomen: No distension, no tenderness, soft. Bowel sounds active.  Musculoskeletal: no clubbing / cyanosis. No joint deformity upper and lower extremities.    Skin: no significant rashes, lesions, ulcers. Warm, dry, well-perfused. Neurologic: No gross facial asymmetry. Sensation intact. Moving all extremities spontaneously.  Psychiatric: Alert, calm. Laughing inappropriately and unable to answer orientation questions.    Labs on Admission: I have personally reviewed following labs and imaging studies  CBC: Recent Labs  Lab 12/27/18 1704  WBC 13.9*  NEUTROABS 11.3*  HGB 9.2*  HCT 29.3*  MCV 100.7*  PLT 578   Basic Metabolic Panel: Recent Labs  Lab 12/27/18 1704  NA 133*  K 4.2  CL 93*  CO2 28  GLUCOSE 119*  BUN 11  CREATININE 0.71  CALCIUM 9.0   GFR: CrCl cannot be calculated (Unknown ideal weight.). Liver Function Tests:  Recent Labs  Lab 12/27/18 1704  AST 8*  ALT 43  ALKPHOS 202*  BILITOT 0.9  PROT 6.8  ALBUMIN 2.6*   No results for input(s): LIPASE, AMYLASE in the last 168 hours. No results for input(s): AMMONIA in the last 168 hours. Coagulation Profile: No results for input(s): INR, PROTIME in the last 168 hours. Cardiac Enzymes: Recent Labs  Lab 12/27/18 1704  TROPONINI 0.04*   BNP (last 3 results) No results for input(s): PROBNP in the last 8760 hours. HbA1C: No results for input(s): HGBA1C in the last 72 hours. CBG: No results for input(s): GLUCAP in the last 168 hours. Lipid Profile: Recent Labs    12/27/18 1704  TRIG 73   Thyroid Function Tests: No results for input(s): TSH, T4TOTAL, FREET4, T3FREE, THYROIDAB in the last 72 hours. Anemia Panel: Recent Labs    12/27/18 1704  FERRITIN 304   Urine analysis:    Component Value Date/Time   COLORURINE AMBER (A) 06/29/2018 [place] 06/29/2018 1555   LABSPEC 1.026 06/29/2018 1555   PHURINE 5.0 06/29/2018 1555   GLUCOSEU NEGATIVE 06/29/2018 1555   HGBUR NEGATIVE 06/29/2018 [place] 06/29/2018 1555   BILIRUBINUR neg 01/23/2012 1445   KETONESUR 20 (A) 06/29/2018 1555   PROTEINUR 30 (A) 06/29/2018 1555   UROBILINOGEN 0.2 01/23/2012 1445   NITRITE NEGATIVE 06/29/2018 1555   LEUKOCYTESUR NEGATIVE 06/29/2018 1555   Sepsis Labs: @LABRCNTIP (procalcitonin:4,lacticidven:4) ) Recent Results (from the past 240 hour(s))  SARS Coronavirus 2 [facility] order, Performed in [place] hospital lab)     Status: None   Collection Time: 12/27/18  5:30 PM  Result Value Ref Range Status   SARS Coronavirus 2 NEGATIVE NEGATIVE Final    Comment: (NOTE) If result is NEGATIVE SARS-CoV-2 target nucleic acids are NOT DETECTED. The SARS-CoV-2 RNA is generally detectable in upper and lower  respiratory specimens during the acute phase of infection. The lowest  concentration of SARS-CoV-2 viral copies this  assay can detect is 250  copies / mL. A negative result does not preclude SARS-CoV-2 infection  and should not be  used as the sole basis for treatment or other  patient management decisions.  A negative result may occur with  improper specimen collection / handling, submission of specimen other  than nasopharyngeal swab, presence of viral mutation(s) within the  areas targeted by this assay, and inadequate number of viral copies  (<250 copies / mL). A negative result must be combined with clinical  observations, patient history, and epidemiological information. If result is POSITIVE SARS-CoV-2 target nucleic acids are DETECTED. The SARS-CoV-2 RNA is generally detectable in upper and lower  respiratory specimens dur ing the acute phase of infection.  Positive  results are indicative of active infection with SARS-CoV-2.  Clinical  correlation with patient history and other diagnostic information is  necessary to determine patient infection status.  Positive results do  not rule out bacterial infection or co-infection with other viruses. If result is PRESUMPTIVE POSTIVE SARS-CoV-2 nucleic acids MAY BE PRESENT.   A presumptive positive result was obtained on the submitted specimen  and confirmed on repeat testing.  While 2019 novel coronavirus  (SARS-CoV-2) nucleic acids may be present in the submitted sample  additional confirmatory testing may be necessary for epidemiological  and / or clinical management purposes  to differentiate between  SARS-CoV-2 and other Sarbecovirus currently known to infect humans.  If clinically indicated additional testing with an alternate test  methodology [phone]) is advised. The SARS-CoV-2 RNA is generally  detectable in upper and lower respiratory sp ecimens during the acute  phase of infection. The expected result is Negative. Fact Sheet for Patients:  StrictlyIdeas.no Fact Sheet for Healthcare Providers:  BankingDealers.co.za This test is not yet approved or cleared by the [place] FDA and has been authorized for detection and/or diagnosis of SARS-CoV-2 by FDA under an Emergency Use Authorization (EUA).  This EUA will remain in effect (meaning this test can be used) for the duration of the COVID-19 declaration under Section 564(b)(1) of the Act, 21 U.S.C. section 360bbb-3(b)(1), unless the authorization is terminated or revoked sooner. Performed at [facility], [place] [address] [place], [place] [address]      Radiological Exams on Admission: Dg Chest Port 1 View  Result Date: 12/27/2018 CLINICAL DATA:  Fever.  Altered mental status. EXAM: PORTABLE CHEST 1 VIEW COMPARISON:  09/07/2018 and prior radiographs FINDINGS: Mild cardiomegaly again noted. There is no evidence of focal airspace disease, pulmonary edema, suspicious pulmonary nodule/mass, pleural effusion, or pneumothorax. No acute bony abnormalities are identified. Thoracic stimulator, severe degenerative changes in the LEFT shoulder and bilateral axillary surgical clips again noted. IMPRESSION: Mild cardiomegaly without evidence of acute cardiopulmonary disease. Electronically Signed   By: Margarette Canada M.D.   On: 12/27/2018 17:50    EKG: Ordered, not yet performed.   Assessment/Plan   1. Sepsis; suspected UTI  - Presents from SNF with fevers despite 4 days of antibiotic treatment for UTI  - She is febrile on arrival with leukocytosis, normal lactate, SBP 90's  - There are no respiratory s/s, CXR clear, and COVID-19 negative  - Abdominal exam benign, no meningismus, and no infected wounds or cellulitis noted    - Still waiting on UA here, will send for repeat culture, continue Rocephin, follow clinical course    2. Dementia; depression with anxiety  - Disoriented and non-ambulatory at baseline per report of her facility  - Continue Aricept, Cymbalta, and prn Ativan   3. Chronic pain   - Continue home regimen with long acting oxycodone, Cymbalta, as-needed Norco, and as-needed  Flexeril  - Continue bowel regimen    4. Chronic diastolic CHF  - Appears hypovolemic on admission  - Hx of systolic dysfunction but EF had normalized on echo from Jan 2020 which also demonstrated grade 1 diastolic dysfunction, no wall-motion abnormalities, and mild AI  - Hold beta-blocker and Entresto given low-normal BP in ED, continue cautious IVF hydration, follow daily wt and I/O's    5. Anemia   - Hgb appears to be stable with no active bleeding  - She will continue nutritional supplements   6. Emphysema  - No cough or wheezing on admission  - Looks like Incruse recently discontinued, will continue as-needed albuterol     PPE: Mask, face shield  DVT prophylaxis: Lovenox  Code Status: DNR    Family Communication: Discussed with patient  Consults called: None Admission status: Observation     Vianne Bulls, MD [facility] Pager [phone]  If 7PM-7AM, please contact night-coverage [email or web address] Password [facility]  12/27/2018, 7:58 PM

## 2018-12-27 NOTE — ED Provider Notes (Signed)
[facility] DEPT Provider Note   CSN: [id number] Arrival date & time: 12/27/18  1631    History   Chief Complaint Chief Complaint  Patient presents with  . Fever    HPI Yolanda [name] is a 85 y.o. female.     HPI Patient is sent from [place].  She is sent for fever.  Patient has been treated for UTI with antibiotics X4 days.  At baseline patient reportedly has demented with poor recall.  Patient does have a culture results in her paperwork with 10-50,000 CFU E. coli mostly sensitive culture.  Patient endorses pain everywhere.  There is no potential for localizing.  She endorses that she does feel very badly.  She cannot qualify in what way. Past Medical History:  Diagnosis Date  . Adenomatous colon polyp    tubular  . Anxiety disorder    panic attacks, PMH of. Dr  Janna Arch  . 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 Diverticulosis   . Empyema ([place])    Left   . Fibroids    Lupron shots  . GERD (gastroesophageal reflux disease)   . High altitude sickness 2008  . Premature menopause    due to Lupron    Patient Active Problem List   Diagnosis Date Noted  . Sepsis secondary to UTI ([place]) 12/27/2018  . Chronic diastolic CHF (congestive heart failure) ([place]) 12/27/2018  . History of CVA (cerebrovascular accident) 12/27/2018  . Macrocytic anemia 12/27/2018  . Chronic pain 12/27/2018  . COPD (chronic obstructive pulmonary disease) ([place]) 10/27/2018  . Closed left hip fracture ([place]) 09/07/2018  . Takotsubo cardiomyopathy 07/05/2018  . Acute respiratory failure with hypoxia ([place])   . Acute pulmonary edema ([facility])   . Dementia without behavioral disturbance ([place]) 06/29/2018  . Empyema lung ([place])   . Pleural effusion on left   . Acute respiratory failure with hypoxemia ([place])   . Leukocytosis   . Severe protein-calorie malnutrition ([place])   . Acute bronchitis 02/07/2017  . ETOH abuse  02/07/2017  . Fall 02/07/2017  . Coccyx pain 02/07/2017  . Abnormal EKG 11/24/2015  . Pre-operative cardiovascular examination 11/24/2015  . Non-compliant behavior 06/23/2015  . ARTHRALGIA 05/14/2010  . SIGMOID POLYP 10/10/2009  . CARPAL TUNNEL SYNDROME 05/24/2009  . FASTING HYPERGLYCEMIA 05/24/2009  . ASTHMA 05/19/2008  . GERD 05/19/2008  . ELEVATED BLOOD PRESSURE WITHOUT DIAGNOSIS OF HYPERTENSION 05/19/2008  . TRIGGER FINGER, RIGHT MIDDLE 10/05/2007  . 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 BREAST LUMPECTOMY  1984   radiation , R breast  . CESAREAN SECTION     X 2  . colonoscopy with polypectomy  2011   Dr  Delfin Edis  . FEMUR IM NAIL Left 09/09/2018   Procedure: INTRAMEDULLARY (IM) NAIL FEMORAL;  Surgeon: Gaynelle Arabian, MD;  Location: [facility];  Service: Orthopedics;  Laterality: Left;  60 minutes Ok per [place] to follow himself  . LAMINECTOMY     X3 L-Sspine  . MASTECTOMY  1990   R breast  . MASTECTOMY  1996   L     OB History   No obstetric history on file.      Home Medications    Prior to Admission medications   Medication Sig Start Date End Date Taking? Authorizing Provider  acetaminophen (TYLENOL) 500 MG tablet Take 1,000 mg by mouth every 8 (eight) hours as needed.  Yes [provider]  Cholecalciferol (VITAMIN D3) 1.25 MG (50000 UT) CAPS Take 50,000 Units by mouth every Friday.  05/07/18  Yes [provider]  cyclobenzaprine (FLEXERIL) 5 MG tablet Take 5 mg by mouth 3 (three) times daily as needed for muscle spasms.  03/30/18  Yes [provider]  donepezil (ARICEPT) 10 MG tablet Take 10 mg by mouth at bedtime.  06/19/18  Yes [provider]  DULoxetine (CYMBALTA) 60 MG capsule Take 60 mg by mouth daily.   Yes [provider]  famotidine (PEPCID) 20 MG tablet Take 1 tablet (20 mg total) by mouth 2 (two) times daily. 02/20/17  Yes Robbie Lis, MD  feeding  supplement, ENSURE ENLIVE, (ENSURE ENLIVE) LIQD Take 237 mLs by mouth 2 (two) times daily between meals. 02/20/17  Yes Robbie Lis, MD  folic acid (FOLVITE) 1 MG tablet Take 1 tablet (1 mg total) by mouth daily. 02/20/17  Yes Robbie Lis, MD  furosemide (LASIX) 40 MG tablet Take 1 tablet (40 mg total) by mouth daily. Patient taking differently: Take 40 mg by mouth daily as needed for fluid.  07/27/18  Yes Barrett, Evelene Croon, PA-C  HYDROcodone-acetaminophen (NORCO/VICODIN) 5-325 MG tablet Take 1-2 tablets by mouth every 6 (six) hours as needed for moderate pain. 09/10/18  Yes Edmisten, Kristie L, PA  LORazepam (ATIVAN) 0.5 MG tablet Take 1 tablet (0.5 mg total) by mouth every 8 (eight) hours as needed for anxiety. 09/11/18  Yes Kayleen Memos, DO  methocarbamol (ROBAXIN) 500 MG tablet Take 1 tablet (500 mg total) by mouth every 6 (six) hours as needed for muscle spasms. 09/10/18  Yes Edmisten, Kristie L, PA  metoprolol succinate (TOPROL-XL) 25 MG 24 hr tablet Take 1 tablet (25 mg total) by mouth daily. Take with or immediately following a meal. 08/31/18  Yes Lendon Colonel, NP  MOVANTIK 12.5 MG TABS tablet Take 1 tablet (12.5 mg total) by mouth daily. 07/05/18  Yes Dhungel, Nishant, MD  Multiple Vitamin (MULTIVITAMIN WITH MINERALS) TABS tablet Take 1 tablet by mouth daily. 02/20/17  Yes Robbie Lis, MD  oxyCODONE (OXYCONTIN) 20 mg 12 hr tablet Take 1 tablet (20 mg total) by mouth 2 (two) times daily. 09/11/18  Yes Hall, Archie Patten N, DO  polyethylene glycol (MIRALAX / GLYCOLAX) packet Take 17 g by mouth daily. 09/11/18  Yes Hall, Carole N, DO  sacubitril-valsartan (ENTRESTO) 49-51 MG Take 1 tablet by mouth 2 (two) times daily. 07/05/18  Yes Dhungel, Nishant, MD  senna-docusate (SENOKOT-S) 8.6-50 MG tablet Take 2 tablets by mouth 2 (two) times daily. 09/11/18  Yes Irene Pap N, DO  thiamine 100 MG tablet Take 1 tablet (100 mg total) by mouth daily. 02/20/17  Yes Robbie Lis, MD  enoxaparin (LOVENOX) 30  MG/0.3ML injection Inject 0.3 mLs (30 mg total) into the skin daily for 10 days. Then take one 81 mg aspirin once a day for three weeks. Then discontinue aspirin. Patient not taking: Reported on 12/27/2018 09/10/18 09/20/18  Edmisten, Drue Dun L, PA  umeclidinium bromide (INCRUSE ELLIPTA) 62.5 MCG/INH AEPB Inhale 1 puff into the lungs daily. Patient not taking: Reported on 12/27/2018 10/01/18   Chesley Mires, MD    Family History Family History  Problem Relation Age of Onset  . Asthma Maternal Grandmother   . Coronary artery disease Mother        dysrrhythmia  . Drug abuse Brother   . Heart Problems Father   . Cancer Neg Hx  Social History Social History   Tobacco Use  . Smoking status: Former Smoker    Packs/day: 1.50    Years: 30.00    Pack years: 45.00    Last attempt to quit: 08/19/1978    Years since quitting: 40.3  . Smokeless tobacco: Never Used  . Tobacco comment: 25 years ago as of 2012  Substance Use Topics  . Alcohol use: Yes    Alcohol/week: 9.0 standard drinks    Types: 9 Glasses of wine per week    Comment: wine with dinner   . Drug use: No     Allergies   Patient has no known allergies.   Review of Systems Review of Systems  Cannot obtain review of systems level 5 caveat dementia and poor recall. Physical Exam Updated Vital Signs BP (!) 94/54 (BP Location: Left Arm)   Pulse 72   Temp 98.1 F (36.7 C) (Oral)   Resp 13   Ht 5\' 4"  (1.626 m)   Wt 52.3 kg   SpO2 100%   BMI 19.79 kg/m   Physical Exam Constitutional:      Comments: Patient is alert and physically well-maintained.  She does not have respiratory distress.  She does seem slightly anxious and confused.  HENT:     Head: Normocephalic and atraumatic.     Mouth/Throat:     Mouth: Mucous membranes are moist.     Pharynx: Oropharynx is clear.  Eyes:     Extraocular Movements: Extraocular movements intact.     Conjunctiva/sclera: Conjunctivae normal.  Neck:     Musculoskeletal: Neck  supple.  Cardiovascular:     Rate and Rhythm: Normal rate and regular rhythm.  Pulmonary:     Effort: Pulmonary effort is normal.     Breath sounds: Normal breath sounds.  Abdominal:     General: There is no distension.     Palpations: Abdomen is soft.     Comments: Patient does not have distention or guarding but endorses abdominal pain diffusely.  Cannot localize.  Musculoskeletal:     Comments: Lamination of the patient's back and buttocks is normal without wounds or breakdown evident.  Any kind of movement of her extremities elicits a painful outcry.  This does not localize anywhere.  It can be with a minor movement of her arm or assisting her to sit forward.  Also the same with examining her lower extremities.  Lower extremities are in very good condition.  No peripheral edema, no wounds, no cellulitis.  Extremities do not appear to have any deformities or effusions at joints  Skin:    General: Skin is warm and dry.     Findings: No rash.  Neurological:     Comments: Patient is alert and mildly agitated.  She will answer questions but she does not seem to have any recall.  She will occasionally use expletives to express pain and discomfort of being examined removed.  Her words are clear but she cannot communicate any historical information based on recall.  She does not appear to have any focal motor or neurologic deficits.  Psychiatric:     Comments: Anxious and mildly agitated      ED Treatments / Results  Labs (all labs ordered are listed, but only abnormal results are displayed) Labs Reviewed  CBC WITH DIFFERENTIAL/PLATELET - Abnormal; Notable for the following components:      Result Value   WBC 13.9 (*)    RBC 2.91 (*)    Hemoglobin 9.2 (*)  HCT 29.3 (*)    MCV 100.7 (*)    Neutro Abs 11.3 (*)    Monocytes Absolute 1.1 (*)    All other components within normal limits  COMPREHENSIVE METABOLIC PANEL - Abnormal; Notable for the following components:   Sodium 133 (*)     Chloride 93 (*)    Glucose, Bld 119 (*)    Albumin 2.6 (*)    AST 8 (*)    Alkaline Phosphatase 202 (*)    All other components within normal limits  D-DIMER, QUANTITATIVE (NOT AT [facility]) - Abnormal; Notable for the following components:   D-Dimer, Quant 3.82 (*)    All other components within normal limits  FIBRINOGEN - Abnormal; Notable for the following components:   Fibrinogen >800 (*)    All other components within normal limits  C-REACTIVE PROTEIN - Abnormal; Notable for the following components:   CRP 27.3 (*)    All other components within normal limits  TROPONIN I - Abnormal; Notable for the following components:   Troponin I 0.04 (*)    All other components within normal limits  URINALYSIS, COMPLETE (UACMP) WITH MICROSCOPIC - Abnormal; Notable for the following components:   Color, Urine AMBER (*)    Ketones, ur 5 (*)    Protein, ur 30 (*)    Bacteria, UA RARE (*)    All other components within normal limits  TROPONIN I - Abnormal; Notable for the following components:   Troponin I 0.04 (*)    All other components within normal limits  SARS CORONAVIRUS 2 (HOSPITAL ORDER, [place] [facility])  CULTURE, BLOOD (ROUTINE X 2)  CULTURE, BLOOD (ROUTINE X 2)  URINE CULTURE  LACTIC ACID, PLASMA  PROCALCITONIN  LACTATE DEHYDROGENASE  FERRITIN  TRIGLYCERIDES  BRAIN NATRIURETIC PEPTIDE  BASIC METABOLIC PANEL  CBC WITH DIFFERENTIAL/PLATELET    EKG EKG will not upload from use.  Sinus rhythm without any acute ischemic appearance.  Radiology Dg Chest Port 1 View  Result Date: 12/27/2018 CLINICAL DATA:  Fever.  Altered mental status. EXAM: PORTABLE CHEST 1 VIEW COMPARISON:  09/07/2018 and prior radiographs FINDINGS: Mild cardiomegaly again noted. There is no evidence of focal airspace disease, pulmonary edema, suspicious pulmonary nodule/mass, pleural effusion, or pneumothorax. No acute bony abnormalities are identified. Thoracic stimulator, severe  degenerative changes in the LEFT shoulder and bilateral axillary surgical clips again noted. IMPRESSION: Mild cardiomegaly without evidence of acute cardiopulmonary disease. Electronically Signed   By: Margarette Canada M.D.   On: 12/27/2018 17:50    Procedures Procedures (including critical care time) CRITICAL CARE Performed by: Charlesetta Shanks   Total critical care time: 30 minutes  Critical care time was exclusive of separately billable procedures and treating other patients.  Critical care was necessary to treat or prevent imminent or life-threatening deterioration.  Critical care was time spent personally by me on the following activities: development of treatment plan with patient and/or surrogate as well as nursing, discussions with consultants, evaluation of patient's response to treatment, examination of patient, obtaining history from patient or surrogate, ordering and performing treatments and interventions, ordering and review of laboratory studies, ordering and review of radiographic studies, pulse oximetry and re-evaluation of patient's condition. Medications Ordered in ED Medications  oxyCODONE (OXYCONTIN) 12 hr tablet 20 mg (20 mg Oral Given 12/27/18 2150)  HYDROcodone-acetaminophen (NORCO/VICODIN) 5-325 MG per tablet 1-2 tablet (2 tablets Oral Given 12/27/18 2227)  donepezil (ARICEPT) tablet 10 mg (10 mg Oral Given 12/27/18 2150)  DULoxetine (CYMBALTA) DR  capsule 60 mg (has no administration in time range)  LORazepam (ATIVAN) tablet 0.5 mg (has no administration in time range)  famotidine (PEPCID) tablet 20 mg (20 mg Oral Given 12/27/18 2150)  naloxegol oxalate (MOVANTIK) tablet 12.5 mg (has no administration in time range)  polyethylene glycol (MIRALAX / GLYCOLAX) packet 17 g (has no administration in time range)  senna-docusate (Senokot-S) tablet 2 tablet (2 tablets Oral Given 12/27/18 2149)  cyclobenzaprine (FLEXERIL) tablet 5 mg (has no administration in time range)  feeding  supplement (ENSURE ENLIVE) (ENSURE ENLIVE) liquid 237 mL (has no administration in time range)  enoxaparin (LOVENOX) injection 40 mg (40 mg Subcutaneous Given 12/27/18 2153)  sodium chloride flush (NS) 0.9 % injection 3 mL (3 mLs Intravenous Given 12/27/18 2153)  sodium chloride flush (NS) 0.9 % injection 3 mL (3 mLs Intravenous Given 12/27/18 2153)  sodium chloride flush (NS) 0.9 % injection 3 mL (has no administration in time range)  0.9 %  sodium chloride infusion (has no administration in time range)  acetaminophen (TYLENOL) tablet 650 mg (has no administration in time range)    Or  acetaminophen (TYLENOL) suppository 650 mg (has no administration in time range)  ondansetron (ZOFRAN) tablet 4 mg (has no administration in time range)    Or  ondansetron (ZOFRAN) injection 4 mg (has no administration in time range)  cefTRIAXone (ROCEPHIN) 1 g in sodium chloride 0.9 % 100 mL IVPB (has no administration in time range)  albuterol (PROVENTIL) (2.5 MG/3ML) 0.083% nebulizer solution 2.5 mg (has no administration in time range)  0.9 %  sodium chloride infusion ( Intravenous New Bag/Given 12/27/18 2248)  cefTRIAXone (ROCEPHIN) 1 g in sodium chloride 0.9 % 100 mL IVPB (0 g Intravenous Stopped 12/27/18 1856)  acetaminophen (TYLENOL) tablet 1,000 mg (1,000 mg Oral Given 12/27/18 1752)  sodium chloride 0.9 % bolus 500 mL (500 mLs Intravenous New Bag/Given 12/27/18 2140)     Initial Impression / Assessment and Plan / ED Course  I have reviewed the triage vital signs and the nursing notes.  Pertinent labs & imaging results that were available during my care of the patient were reviewed by me and considered in my medical decision making (see chart for details).       Patient has previously diagnosed urinary tract infection with treatment at SNF.  Patient however has elevated fever now and confusion.  Concern for SIRS/early sepsis with known UTI not responding to outpatient treatment.  Patient given Rocephin  and plan for admission.  Final Clinical Impressions(s) / ED Diagnoses   Final diagnoses:  Acute cystitis without hematuria  SIRS (systemic inflammatory response syndrome) [facility])    ED Discharge Orders    None       Charlesetta Shanks, MD 12/28/18 0008

## 2018-12-28 DIAGNOSIS — I5032 Chronic diastolic (congestive) heart failure: Secondary | ICD-10-CM | POA: Diagnosis present

## 2018-12-28 DIAGNOSIS — D539 Nutritional anemia, unspecified: Secondary | ICD-10-CM | POA: Diagnosis present

## 2018-12-28 DIAGNOSIS — A419 Sepsis, unspecified organism: Secondary | ICD-10-CM | POA: Diagnosis present

## 2018-12-28 DIAGNOSIS — F410 Panic disorder [episodic paroxysmal anxiety] without agoraphobia: Secondary | ICD-10-CM | POA: Diagnosis present

## 2018-12-28 DIAGNOSIS — Z1159 Encounter for screening for other viral diseases: Secondary | ICD-10-CM | POA: Diagnosis not present

## 2018-12-28 DIAGNOSIS — Z8673 Personal history of transient ischemic attack (TIA), and cerebral infarction without residual deficits: Secondary | ICD-10-CM

## 2018-12-28 DIAGNOSIS — G9341 Metabolic encephalopathy: Secondary | ICD-10-CM | POA: Diagnosis present

## 2018-12-28 DIAGNOSIS — N390 Urinary tract infection, site not specified: Secondary | ICD-10-CM | POA: Diagnosis not present

## 2018-12-28 DIAGNOSIS — Z86010 Personal history of colonic polyps: Secondary | ICD-10-CM | POA: Diagnosis not present

## 2018-12-28 DIAGNOSIS — Z87891 Personal history of nicotine dependence: Secondary | ICD-10-CM | POA: Diagnosis not present

## 2018-12-28 DIAGNOSIS — Z79891 Long term (current) use of opiate analgesic: Secondary | ICD-10-CM | POA: Diagnosis not present

## 2018-12-28 DIAGNOSIS — H9190 Unspecified hearing loss, unspecified ear: Secondary | ICD-10-CM | POA: Diagnosis present

## 2018-12-28 DIAGNOSIS — F418 Other specified anxiety disorders: Secondary | ICD-10-CM | POA: Diagnosis present

## 2018-12-28 DIAGNOSIS — F0391 Unspecified dementia with behavioral disturbance: Secondary | ICD-10-CM | POA: Diagnosis present

## 2018-12-28 DIAGNOSIS — E861 Hypovolemia: Secondary | ICD-10-CM | POA: Diagnosis present

## 2018-12-28 DIAGNOSIS — R6510 Systemic inflammatory response syndrome (SIRS) of non-infectious origin without acute organ dysfunction: Secondary | ICD-10-CM | POA: Diagnosis not present

## 2018-12-28 DIAGNOSIS — N3000 Acute cystitis without hematuria: Secondary | ICD-10-CM | POA: Diagnosis present

## 2018-12-28 DIAGNOSIS — Z515 Encounter for palliative care: Secondary | ICD-10-CM | POA: Diagnosis present

## 2018-12-28 DIAGNOSIS — J439 Emphysema, unspecified: Secondary | ICD-10-CM | POA: Diagnosis present

## 2018-12-28 DIAGNOSIS — Z8249 Family history of ischemic heart disease and other diseases of the circulatory system: Secondary | ICD-10-CM | POA: Diagnosis not present

## 2018-12-28 DIAGNOSIS — G8929 Other chronic pain: Secondary | ICD-10-CM | POA: Diagnosis not present

## 2018-12-28 DIAGNOSIS — Z66 Do not resuscitate: Secondary | ICD-10-CM | POA: Diagnosis present

## 2018-12-28 DIAGNOSIS — Z825 Family history of asthma and other chronic lower respiratory diseases: Secondary | ICD-10-CM | POA: Diagnosis not present

## 2018-12-28 DIAGNOSIS — Z7401 Bed confinement status: Secondary | ICD-10-CM | POA: Diagnosis not present

## 2018-12-28 DIAGNOSIS — Z79899 Other long term (current) drug therapy: Secondary | ICD-10-CM | POA: Diagnosis not present

## 2018-12-28 DIAGNOSIS — G894 Chronic pain syndrome: Secondary | ICD-10-CM | POA: Diagnosis present

## 2018-12-28 LAB — BASIC METABOLIC PANEL
Anion gap: 10 (ref 5–15)
BUN: 14 mg/dL (ref 8–23)
CO2: 28 mmol/L (ref 22–32)
Calcium: 8.5 mg/dL — ABNORMAL LOW (ref 8.9–10.3)
Chloride: 100 mmol/L (ref 98–111)
Creatinine, Ser: 0.71 mg/dL (ref 0.44–1.00)
GFR calc Af Amer: >60 mL/min (ref >60)
GFR calc non Af Amer: >60 mL/min (ref >60)
Glucose, Bld: 115 mg/dL — ABNORMAL HIGH (ref 70–99)
Potassium: 3.8 mmol/L (ref 3.5–5.1)
Sodium: 138 mmol/L (ref 135–145)

## 2018-12-28 LAB — CBC WITH DIFFERENTIAL/PLATELET
Abs Immature Granulocytes: 0.07 10*3/uL (ref 0.00–0.07)
Basophils Absolute: 0 10*3/uL (ref 0.0–0.1)
Basophils Relative: 0 %
Eosinophils Absolute: 0 10*3/uL (ref 0.0–0.5)
Eosinophils Relative: 0 %
HCT: 30.8 % — ABNORMAL LOW (ref 36.0–46.0)
Hemoglobin: 9.4 g/dL — ABNORMAL LOW (ref 12.0–15.0)
Immature Granulocytes: 1 %
Lymphocytes Relative: 10 %
Lymphs Abs: 1.4 10*3/uL (ref 0.7–4.0)
MCH: 31.8 pg (ref 26.0–34.0)
MCHC: 30.5 g/dL (ref 30.0–36.0)
MCV: 104.1 fL — ABNORMAL HIGH (ref 80.0–100.0)
Monocytes Absolute: 1.4 10*3/uL — ABNORMAL HIGH (ref 0.1–1.0)
Monocytes Relative: 10 %
Neutro Abs: 11.3 10*3/uL — ABNORMAL HIGH (ref 1.7–7.7)
Neutrophils Relative %: 79 %
Platelets: 464 10*3/uL — ABNORMAL HIGH (ref 150–400)
RBC: 2.96 MIL/uL — ABNORMAL LOW (ref 3.87–5.11)
RDW: 13.1 % (ref 11.5–15.5)
WBC: 14.1 10*3/uL — ABNORMAL HIGH (ref 4.0–10.5)
nRBC: 0 % (ref 0.0–0.2)

## 2018-12-28 LAB — URINE CULTURE: Culture: NO GROWTH

## 2018-12-28 MED ORDER — ADULT MULTIVITAMIN W/MINERALS CH
1.0000 | ORAL_TABLET | Freq: Every day | ORAL | Status: DC
  Administered 2018-12-28 – 2018-12-30 (×3): 1 via ORAL
  Filled 2018-12-28 (×3): qty 1

## 2018-12-28 MED ORDER — SODIUM CHLORIDE 0.9 % IV SOLN
INTRAVENOUS | Status: DC
  Administered 2018-12-28 – 2018-12-29 (×2): via INTRAVENOUS

## 2018-12-28 NOTE — Progress Notes (Signed)
Initial Nutrition Assessment  RD working remotely.   DOCUMENTATION CODES:   (unable to assess for malnutrition at this time.)  INTERVENTION:  - continue Ensure Enlive BID, each supplement provides 350 kcal and 20 grams of protein. - will order Magic Cup once/day with dinner meals, each supplement provides 290 kcal and 9 grams of protein. - will order daily multivitamin with minerals. - continue to encourage PO intakes and assist with feeding, if needed.    NUTRITION DIAGNOSIS:   Inadequate oral intake related to acute illness, lethargy/confusion as evidenced by meal completion < 25%.  GOAL:   Patient will meet greater than or equal to 90% of their needs  MONITOR:   PO intake, Supplement acceptance, Labs, Weight trends  REASON FOR ASSESSMENT:   Malnutrition Screening Tool  ASSESSMENT:   85 y.o. female with medical history significant for history of CVA, dementia with behavioral disturbance, emphysema, chronic pain, and diastolic CHF. She presented to the ED from SNF for evaluation of fevers. Patient was diagnosed with UTI recently and has been on an oral antibiotic for the past 4 days, but was noted to be persistently febrile and sent to the ED for further evaluation. She was disoriented on arrival, and per report from SNF, she is disoriented and non-ambulatory at baseline.  RN flow sheet indicates that patient is a/o to self and time. Current weight is 119 lb, weight on 10/27/18 was 113 lb, and weight on 10/01/18 was 109 lb. Will monitor weight trends closely. Per RN flow sheet, patient consumed only bites of breakfast this AM. Patient has not been seen by a RD within the [facility] system since 02/2017 at which time patient met criteria for malnutrition in the context of chronic illness as evidenced by mild fat and moderate muscle depletions. Weight at that time was 115 lb. Suspect patient continues to meet criteria for malnutrition but unable to confirm at this time.   Able to  communicate with Dr. Cathlean Sauer via secure chat. Will liberalize diet from Heart Healthy to Regular.   Per notes: sepsis suspected to be 2/2 UTI, dementia and is disoriented and non-ambulatory at baseline, chronic pain, emphysema without any signs of distress on admission.     Medications reviewed; 20 mg oral pepcid BID, 1 packet miralax/day, 2 tablets senokot BID. Labs reviewed; Ca: 8.5 mg/dl.   NUTRITION - FOCUSED PHYSICAL EXAM:  unable to perform at this time.   Diet Order:   Diet Order            Diet Heart Room service appropriate? Yes; Fluid consistency: Thin  Diet effective now              EDUCATION NEEDS:   No education needs have been identified at this time  Skin:  Skin Assessment: Reviewed RN Assessment  Last BM:  PTA/unknown  Height:   Ht Readings from Last 1 Encounters:  12/27/18 '5\' 4"'$  (1.626 m)    Weight:   Wt Readings from Last 1 Encounters:  12/28/18 54 kg    Ideal Body Weight:  54.5 kg  BMI:  Body mass index is 20.43 kg/m.  Estimated Nutritional Needs:   Kcal:  1620-1835 kcal  Protein:  65-80 grams  Fluid:  >/= 1.8 L/day     Jarome Matin, MS, RD, LDN, [facility] Inpatient Clinical Dietitian Pager # [phone] After hours/weekend pager # [phone]

## 2018-12-28 NOTE — Progress Notes (Signed)
PROGRESS NOTE    Yolanda [name]  GEX:[id number] DOB: 11-06-1933 DOA: 12/27/2018 PCP: Javier Glazier, MD    Brief Narrative:  85 year old female who presented with fevers.  She does have significant past medical history of dementia, history of CVA, emphysema, chronic pain syndrome and diastolic heart failure.  Patient has been recently diagnosed with a urinary tract infection, she has been taking antibiotics for about 4 days, with persistent fevers.  She was not able to give any detailed history due to her cognitive impairment.  Apparently she has been disoriented and having more difficulty while ambulating.  On her initial physical examination blood pressure 113/55, heart rate 85, respiratory rate 19, oxygen saturation 99%.  She had moist mucous membranes, her lungs were clear to auscultation bilaterally, heart S1-S2 present and rhythmic, the abdomen was soft nontender, no lower extremity edema, she was unable to answer any questions.  Sodium 133, potassium 4.2, chloride 93, bicarb 28, glucose 119, BUN 11, creatinine 0.71, white count 13.9, hemoglobin 9.2, hematocrit 29.3, platelets 328, SARS COVID-19 was negative, urinalysis had 11-20 white cells.  Chest radiograph was negative for infiltrates.  EKG 79 bpm, left axis, normal intervals, poor R wave progression, no ST segment or T wave changes.  Patient was admitted to the hospital with a working diagnosis of sepsis due to urinary tract infection, failed outpatient therapy  Assessment & Plan:   Principal Problem:   Sepsis secondary to UTI [facility]) Active Problems:   Dementia without behavioral disturbance (HCC)   COPD (chronic obstructive pulmonary disease) (HCC)   Chronic diastolic CHF (congestive heart failure) (HCC)   History of CVA (cerebrovascular accident)   Macrocytic anemia   Chronic pain   1. Sepsis due to urine infection.  T max on 05/10 at 16:48 up to 102.2, wbc is stable from 13,9 to 14,1, episodic hypotension with systolic 88  and 99. Will continue antibiotic therapy with IV ceftriaxone, IV fluids with isotonic saline. Will continue re-bolus for hypotension, follow on cultures, cell count and temperature curve. Old records personally reviewed no old cultures positive in the past.   2. Metabolic encephalopathy in the setting of dementia. This am able to follow commands, no agitation, but continue to be disorientated to place, time and situation. Will continue neuro checks per unit protocol, not clear baseline. Continue donepezil and duloxetine. As needed lorazepam.  3. COPD. No current signs of exacerbation, will continue oxymetry monitoring.  4. Diastolic heart failure. Will continue isotonic IV fluids, no signs of acute decompensation, will continue close monitoring. No signs of volume overload.   5. Hx of CVA. Stable, will follow with physical therapy evaluation. Patient comes from SNF.   DVT prophylaxis: enoxaparin   Code Status:  full Family Communication: no family at the bedside  Disposition Plan/ discharge barriers: pending clinical improvement  Body mass index is 20.43 kg/m. Malnutrition Type:  Nutrition Problem: Inadequate oral intake Etiology: acute illness, lethargy/confusion   Malnutrition Characteristics:  Signs/Symptoms: meal completion < 25%   Nutrition Interventions:  Interventions: Ensure Enlive (each supplement provides 350kcal and 20 grams of protein), Magic cup, MVI  RN Pressure Injury Documentation:     Consultants:     Procedures:     Antimicrobials:   IV ceftriaxone.     Subjective: Patient is confused and disorientated, no active chest pain or dyspnea, no nausea or vomiting. Limited history due to dementia.   Objective: Vitals:   12/28/18 0218 12/28/18 0445 12/28/18 0628 12/28/18 1054  BP:  (!) 88/43 Marland Kitchen)  120/54 123/63  Pulse:  68 73 70  Resp: 17 12 20 16   Temp:  (!) 97.5 F (36.4 C)    TempSrc:  Oral    SpO2:  100% 100% 100%  Weight:  54 kg    Height:         Intake/Output Summary (Last 24 hours) at 12/28/2018 1115 Last data filed at 12/28/2018 0900 Gross per 24 hour  Intake 30 ml  Output -  Net 30 ml   Filed Weights   12/27/18 2328 12/28/18 0445  Weight: 52.3 kg 54 kg    Examination:   General: deconditioned and ill looking appearing  Neurology: Awake and alert, confused and disorientated.  E ENT: mild pallor, no icterus, oral mucosa moist Cardiovascular: No JVD. S1-S2 present, rhythmic, no gallops, rubs, or murmurs. No lower extremity edema. Pulmonary: positive breath sounds bilaterally, adequate air movement, no wheezing, rhonchi or rales. Gastrointestinal. Abdomen with no organomegaly, non tender, no rebound or guarding Skin. No rashes Musculoskeletal: no joint deformities     Data Reviewed: I have personally reviewed following labs and imaging studies  CBC: Recent Labs  Lab 12/27/18 1704 12/28/18 0340  WBC 13.9* 14.1*  NEUTROABS 11.3* 11.3*  HGB 9.2* 9.4*  HCT 29.3* 30.8*  MCV 100.7* 104.1*  PLT 328 440*   Basic Metabolic Panel: Recent Labs  Lab 12/27/18 1704 12/28/18 0340  NA 133* 138  K 4.2 3.8  CL 93* 100  CO2 28 28  GLUCOSE 119* 115*  BUN 11 14  CREATININE 0.71 0.71  CALCIUM 9.0 8.5*   GFR: Estimated Creatinine Clearance: 46.2 mL/min (by C-G formula based on SCr of 0.71 mg/dL). Liver Function Tests: Recent Labs  Lab 12/27/18 1704  AST 8*  ALT 43  ALKPHOS 202*  BILITOT 0.9  PROT 6.8  ALBUMIN 2.6*   No results for input(s): LIPASE, AMYLASE in the last 168 hours. No results for input(s): AMMONIA in the last 168 hours. Coagulation Profile: No results for input(s): INR, PROTIME in the last 168 hours. Cardiac Enzymes: Recent Labs  Lab 12/27/18 1704 12/27/18 2158  TROPONINI 0.04* 0.04*   BNP (last 3 results) No results for input(s): PROBNP in the last 8760 hours. HbA1C: No results for input(s): HGBA1C in the last 72 hours. CBG: No results for input(s): GLUCAP in the last 168 hours.  Lipid Profile: Recent Labs    12/27/18 1704  TRIG 73   Thyroid Function Tests: No results for input(s): TSH, T4TOTAL, FREET4, T3FREE, THYROIDAB in the last 72 hours. Anemia Panel: Recent Labs    12/27/18 1704  FERRITIN 304      Radiology Studies: I have reviewed all of the imaging during this hospital visit personally     Scheduled Meds: . donepezil  10 mg Oral QHS  . DULoxetine  60 mg Oral Daily  . enoxaparin (LOVENOX) injection  40 mg Subcutaneous QHS  . famotidine  20 mg Oral BID  . feeding supplement (ENSURE ENLIVE)  237 mL Oral BID BM  . multivitamin with minerals  1 tablet Oral Daily  . naloxegol oxalate  12.5 mg Oral Daily  . oxyCODONE  20 mg Oral BID  . polyethylene glycol  17 g Oral Daily  . senna-docusate  2 tablet Oral BID  . sodium chloride flush  3 mL Intravenous Q12H  . sodium chloride flush  3 mL Intravenous Q12H   Continuous Infusions: . sodium chloride 250 mL (12/28/18 0221)  . cefTRIAXone (ROCEPHIN)  IV  LOS: 0 days        Shadiyah Wernli Gerome Apley, MD

## 2018-12-29 ENCOUNTER — Ambulatory Visit: Payer: Commercial Managed Care - PPO | Admitting: Adult Health

## 2018-12-29 DIAGNOSIS — Z7189 Other specified counseling: Secondary | ICD-10-CM

## 2018-12-29 DIAGNOSIS — Z515 Encounter for palliative care: Secondary | ICD-10-CM

## 2018-12-29 DIAGNOSIS — R6510 Systemic inflammatory response syndrome (SIRS) of non-infectious origin without acute organ dysfunction: Secondary | ICD-10-CM

## 2018-12-29 LAB — CBC WITH DIFFERENTIAL/PLATELET
Abs Immature Granulocytes: 0.05 10*3/uL (ref 0.00–0.07)
Basophils Absolute: 0 10*3/uL (ref 0.0–0.1)
Basophils Relative: 0 %
Eosinophils Absolute: 0.1 10*3/uL (ref 0.0–0.5)
Eosinophils Relative: 1 %
HCT: 29.6 % — ABNORMAL LOW (ref 36.0–46.0)
Hemoglobin: 9 g/dL — ABNORMAL LOW (ref 12.0–15.0)
Immature Granulocytes: 0 %
Lymphocytes Relative: 18 %
Lymphs Abs: 2 10*3/uL (ref 0.7–4.0)
MCH: 31.6 pg (ref 26.0–34.0)
MCHC: 30.4 g/dL (ref 30.0–36.0)
MCV: 103.9 fL — ABNORMAL HIGH (ref 80.0–100.0)
Monocytes Absolute: 0.9 10*3/uL (ref 0.1–1.0)
Monocytes Relative: 8 %
Neutro Abs: 8.3 10*3/uL — ABNORMAL HIGH (ref 1.7–7.7)
Neutrophils Relative %: 73 %
Platelets: 457 10*3/uL — ABNORMAL HIGH (ref 150–400)
RBC: 2.85 MIL/uL — ABNORMAL LOW (ref 3.87–5.11)
RDW: 12.7 % (ref 11.5–15.5)
WBC: 11.3 10*3/uL — ABNORMAL HIGH (ref 4.0–10.5)
nRBC: 0 % (ref 0.0–0.2)

## 2018-12-29 LAB — BASIC METABOLIC PANEL
Anion gap: 10 (ref 5–15)
BUN: 13 mg/dL (ref 8–23)
CO2: 25 mmol/L (ref 22–32)
Calcium: 8.4 mg/dL — ABNORMAL LOW (ref 8.9–10.3)
Chloride: 101 mmol/L (ref 98–111)
Creatinine, Ser: 0.55 mg/dL (ref 0.44–1.00)
GFR calc Af Amer: >60 mL/min (ref >60)
GFR calc non Af Amer: >60 mL/min (ref >60)
Glucose, Bld: 97 mg/dL (ref 70–99)
Potassium: 3.6 mmol/L (ref 3.5–5.1)
Sodium: 136 mmol/L (ref 135–145)

## 2018-12-29 LAB — MRSA PCR SCREENING: MRSA by PCR: NEGATIVE

## 2018-12-29 NOTE — Consult Note (Signed)
Consultation Note Date: 12/29/2018   Patient Name: Yolanda [name]  DOB: 1934/07/12  MRN: [id number]  Age / Sex: 85 y.o., female  PCP: Javier Glazier, MD Referring Physician: Tawni Millers  Reason for Consultation: Establishing goals of care  HPI/Patient Profile: 85 y.o. female   admitted on 12/27/2018    Clinical Assessment and Goals of Care: 85 year old lady who lives at [place] burn facility.  Patient has a past medical history significant for dementia stroke emphysema chronic pain syndrome and diastolic heart failure.  In November 2019 she was hospitalized with serious lung infection.  In January 2020, patient was hospitalized with left hip fracture and underwent surgery.  Patient has had gradual progressive decline and ongoing functional loss.  She has mostly become bedbound.  She has become incontinent.  She has had diminished oral intake.  She has extremely limited use of her left hand now.  Most of the history is obtained from the chart as well as from discussions with the patient's son over the phone.  Patient is awake alert resting in bed.  She is able to state her name.  I am not sure whether she knows she is in the hospital.  She knows she lives at [facility].  She states that she was feeling some nausea but does not have nausea currently.  I discussed with her that she was at [facility] with urinary tract infection.  Call placed and discussed with son Ilona Sorrel at [phone].  I introduced myself and palliative care as follows:   Palliative medicine is specialized medical care for people living with serious illness. It focuses on providing relief from the symptoms and stress of a serious illness. The goal is to improve quality of life for both the patient and the family.  Also introduced the concept of goals of care discussion as follows:  Goals of care: Broad aims of  medical therapy in relation to the patient's values and preferences. Our aim is to provide medical care aimed at enabling patients to achieve the goals that matter most to them, given the circumstances of their particular medical situation and their constraints.   The patient son states that the patient has continued to decline.  Her oral intake continues to decline her functional status continues to decline.  He states that since her hip surgery she has severe pain on the left side of her hip.  She also has back discomfort which is chronic.  She is on chronic opioids.  Son Ilona Sorrel states that they had independently reached out to [facility] [place] and have gotten preliminary information.  We discussed in detail about full scope of hospice services.  Difference between residential hospice and home with hospice explained.  Patient son states that patient has 4 boys who live locally.  They have all discussed and decided for the patient to go to her apartment at [place] burn after discharge with [facility] [place] following.  Patient has 24/7 caregivers already arranged.  Given patient's  ongoing decline, it does appear that the patient is hospice eligible.  She probably has a prognosis 6 months or less.  She remains at high risk for recurrent infections, sepsis, recurrent cerebrovascular accidents.  She has low ejection fraction from previous hospitalization.   NEXT OF KIN 4 sons who live locally.   SUMMARY OF RECOMMENDATIONS   Agree with DO NOT RESUSCITATE. Care manager consult: Patient's family is requesting for [facility] [place].  This is very appropriate.  Patient is hospice eligible in my opinion. Follow urine cultures, continue current scope of care. Continue current pain and non pain regimen.   Code Status/Advance Care Planning:  DNR    Symptom Management:    as above   Palliative Prophylaxis:   Delirium Protocol  Psycho-social/Spiritual:   Desire for further  Chaplaincy support:yes  Additional Recommendations: Education on Hospice  Prognosis:   < 6 months  Discharge Planning: Home with Hospice      Primary Diagnoses: Present on Admission: . Sepsis secondary to UTI ([place]) . Dementia without behavioral disturbance ([place]) . COPD (chronic obstructive pulmonary disease) ([place]) . Chronic diastolic CHF (congestive heart failure) ([place]) . Macrocytic anemia . Chronic pain   I have reviewed the medical record, interviewed the patient and family, and examined the patient. The following aspects are pertinent.  Past Medical History:  Diagnosis Date  . Adenomatous colon polyp    tubular  . Anxiety disorder    panic attacks, PMH of. Dr  Janna Arch  . 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 Diverticulosis   . Empyema ([place])    Left   . Fibroids    Lupron shots  . GERD (gastroesophageal reflux disease)   . High altitude sickness 2008  . Premature menopause    due to Lupron   Social History   Socioeconomic History  . Marital status: Single    Spouse name: Not on file  . Number of children: Not on file  . Years of education: Not on file  . Highest education level: Not on file  Occupational History  . Occupation: Scientist, product/process development: [employer]  . Financial resource strain: Not on file  . Food insecurity:    Worry: Not on file    Inability: Not on file  . Transportation needs:    Medical: Not on file    Non-medical: Not on file  Tobacco Use  . Smoking status: Former Smoker    Packs/day: 1.50    Years: 30.00    Pack years: 45.00    Last attempt to quit: 08/19/1978    Years since quitting: 40.3  . Smokeless tobacco: Never Used  . Tobacco comment: 25 years ago as of 2012  Substance and Sexual Activity  . Alcohol use: Yes    Alcohol/week: 9.0 standard drinks    Types: 9 Glasses of wine per week    Comment: wine with dinner   . Drug use: No  . Sexual activity:  Never  Lifestyle  . Physical activity:    Days per week: Not on file    Minutes per session: Not on file  . Stress: Not on file  Relationships  . Social connections:    Talks on phone: Not on file    Gets together: Not on file    Attends religious service: Not on file    Active member of club or organization: Not on file  Attends meetings of clubs or organizations: Not on file    Relationship status: Not on file  Other Topics Concern  . Not on file  Social History Narrative   epworth sleepiness scale = 8 (11/24/15)   Family History  Problem Relation Age of Onset  . Asthma Maternal Grandmother   . Coronary artery disease Mother        dysrrhythmia  . Drug abuse Brother   . Heart Problems Father   . Cancer Neg Hx    Scheduled Meds: . donepezil  10 mg Oral QHS  . DULoxetine  60 mg Oral Daily  . enoxaparin (LOVENOX) injection  40 mg Subcutaneous QHS  . famotidine  20 mg Oral BID  . feeding supplement (ENSURE ENLIVE)  237 mL Oral BID BM  . multivitamin with minerals  1 tablet Oral Daily  . naloxegol oxalate  12.5 mg Oral Daily  . oxyCODONE  20 mg Oral BID  . polyethylene glycol  17 g Oral Daily  . senna-docusate  2 tablet Oral BID  . sodium chloride flush  3 mL Intravenous Q12H  . sodium chloride flush  3 mL Intravenous Q12H   Continuous Infusions: . sodium chloride 250 mL (12/28/18 0221)  . cefTRIAXone (ROCEPHIN)  IV 1 g (12/28/18 1733)   PRN Meds:.sodium chloride, acetaminophen **OR** acetaminophen, albuterol, cyclobenzaprine, HYDROcodone-acetaminophen, LORazepam, ondansetron **OR** ondansetron (ZOFRAN) IV, sodium chloride flush Medications Prior to Admission:  Prior to Admission medications   Medication Sig Start Date End Date Taking? Authorizing Provider  acetaminophen (TYLENOL) 500 MG tablet Take 1,000 mg by mouth every 8 (eight) hours as needed.   Yes [provider]  Cholecalciferol (VITAMIN D3) 1.25 MG (50000 UT) CAPS Take 50,000 Units by mouth every  Friday.  05/07/18  Yes [provider]  cyclobenzaprine (FLEXERIL) 5 MG tablet Take 5 mg by mouth 3 (three) times daily as needed for muscle spasms.  03/30/18  Yes [provider]  donepezil (ARICEPT) 10 MG tablet Take 10 mg by mouth at bedtime.  06/19/18  Yes [provider]  DULoxetine (CYMBALTA) 60 MG capsule Take 60 mg by mouth daily.   Yes [provider]  famotidine (PEPCID) 20 MG tablet Take 1 tablet (20 mg total) by mouth 2 (two) times daily. 02/20/17  Yes Robbie Lis, MD  feeding supplement, ENSURE ENLIVE, (ENSURE ENLIVE) LIQD Take 237 mLs by mouth 2 (two) times daily between meals. 02/20/17  Yes Robbie Lis, MD  folic acid (FOLVITE) 1 MG tablet Take 1 tablet (1 mg total) by mouth daily. 02/20/17  Yes Robbie Lis, MD  furosemide (LASIX) 40 MG tablet Take 1 tablet (40 mg total) by mouth daily. Patient taking differently: Take 40 mg by mouth daily as needed for fluid.  07/27/18  Yes Barrett, Evelene Croon, PA-C  HYDROcodone-acetaminophen (NORCO/VICODIN) 5-325 MG tablet Take 1-2 tablets by mouth every 6 (six) hours as needed for moderate pain. 09/10/18  Yes Edmisten, Kristie L, PA  LORazepam (ATIVAN) 0.5 MG tablet Take 1 tablet (0.5 mg total) by mouth every 8 (eight) hours as needed for anxiety. 09/11/18  Yes Kayleen Memos, DO  methocarbamol (ROBAXIN) 500 MG tablet Take 1 tablet (500 mg total) by mouth every 6 (six) hours as needed for muscle spasms. 09/10/18  Yes Edmisten, Kristie L, PA  metoprolol succinate (TOPROL-XL) 25 MG 24 hr tablet Take 1 tablet (25 mg total) by mouth daily. Take with or immediately following a meal. 08/31/18  Yes Lendon Colonel, NP  MOVANTIK 12.5 MG TABS tablet Take 1 tablet (12.5 mg total) by mouth daily. 07/05/18  Yes Dhungel, Nishant, MD  Multiple Vitamin (MULTIVITAMIN WITH MINERALS) TABS tablet Take 1 tablet by mouth daily. 02/20/17  Yes Robbie Lis, MD  oxyCODONE (OXYCONTIN) 20 mg 12 hr tablet Take 1 tablet (20 mg total) by mouth 2  (two) times daily. 09/11/18  Yes Hall, Archie Patten N, DO  polyethylene glycol (MIRALAX / GLYCOLAX) packet Take 17 g by mouth daily. 09/11/18  Yes Hall, Carole N, DO  sacubitril-valsartan (ENTRESTO) 49-51 MG Take 1 tablet by mouth 2 (two) times daily. 07/05/18  Yes Dhungel, Nishant, MD  senna-docusate (SENOKOT-S) 8.6-50 MG tablet Take 2 tablets by mouth 2 (two) times daily. 09/11/18  Yes Irene Pap N, DO  thiamine 100 MG tablet Take 1 tablet (100 mg total) by mouth daily. 02/20/17  Yes Robbie Lis, MD  enoxaparin (LOVENOX) 30 MG/0.3ML injection Inject 0.3 mLs (30 mg total) into the skin daily for 10 days. Then take one 81 mg aspirin once a day for three weeks. Then discontinue aspirin. Patient not taking: Reported on 12/27/2018 09/10/18 09/20/18  Edmisten, Drue Dun L, PA  umeclidinium bromide (INCRUSE ELLIPTA) 62.5 MCG/INH AEPB Inhale 1 puff into the lungs daily. Patient not taking: Reported on 12/27/2018 10/01/18   Chesley Mires, MD   No Known Allergies Review of Systems +nausea on admit  Physical Exam Frail weak lady Some what confused Regular S1 S2 No edema Abdomen is soft Awake  Vital Signs: BP 130/63 (BP Location: Left Arm)   Pulse 91   Temp 98.3 F (36.8 C) (Axillary)   Resp 18   Ht 5\' 4"  (1.626 m)   Wt 55.1 kg   SpO2 93%   BMI 20.85 kg/m  Pain Scale: 0-10   Pain Score: 0-No pain   SpO2: SpO2: 93 % O2 Device:SpO2: 93 % O2 Flow Rate: .O2 Flow Rate (L/min): 3 L/min  IO: Intake/output summary:   Intake/Output Summary (Last 24 hours) at 12/29/2018 1612 Last data filed at 12/29/2018 1000 Gross per 24 hour  Intake 1650.58 ml  Output 675 ml  Net 975.58 ml    LBM: Last BM Date: (UTA pt with demenita) Baseline Weight: Weight: 52.3 kg Most recent weight: Weight: 55.1 kg     Palliative Assessment/Data:   PPS 30%  Time In:  1500 Time Out:  1600 Time Total:  60 Greater than 50%  of this time was spent counseling and coordinating care related to the above assessment and plan.   Signed by: Loistine Chance, MD [id number]  Please contact Palliative Medicine Team phone at [phone] for questions and concerns.  For individual provider: See Shea Evans

## 2018-12-29 NOTE — Progress Notes (Signed)
Assumed care from earlier RN. Agree with previous assessment. Remains confused but fairly cooperative. No changes at present. Eulas Post, RN

## 2018-12-29 NOTE — Progress Notes (Signed)
PROGRESS NOTE    Yolanda [name]  KNL:[id number] DOB: 1934-06-23 DOA: 12/27/2018 PCP: Javier Glazier, MD    Brief Narrative:  85 year old female who presented with fevers.  She does have significant past medical history of dementia, history of CVA, emphysema, chronic pain syndrome and diastolic heart failure.  Patient has been recently diagnosed with a urinary tract infection, she has been taking antibiotics for about 4 days, with persistent fevers.  She was not able to give any detailed history due to her cognitive impairment.  Apparently she has been disoriented and having more difficulty while ambulating.  On her initial physical examination blood pressure 113/55, heart rate 85, respiratory rate 19, oxygen saturation 99%.  She had moist mucous membranes, her lungs were clear to auscultation bilaterally, heart S1-S2 present and rhythmic, the abdomen was soft nontender, no lower extremity edema, she was unable to answer any questions.  Sodium 133, potassium 4.2, chloride 93, bicarb 28, glucose 119, BUN 11, creatinine 0.71, white count 13.9, hemoglobin 9.2, hematocrit 29.3, platelets 328, SARS COVID-19 was negative, urinalysis had 11-20 white cells.  Chest radiograph was negative for infiltrates.  EKG 79 bpm, left axis, normal intervals, poor R wave progression, no ST segment or T wave changes.  Patient was admitted to the hospital with a working diagnosis of sepsis due to urinary tract infection, failed outpatient therapy   Assessment & Plan:   Principal Problem:   Sepsis secondary to UTI [facility]) Active Problems:   Dementia without behavioral disturbance (HCC)   COPD (chronic obstructive pulmonary disease) (HCC)   Chronic diastolic CHF (congestive heart failure) (HCC)   History of CVA (cerebrovascular accident)   Macrocytic anemia   Chronic pain  1. Sepsis due to urine infection.  Patient has remained afebrile for the last 24 H, wbc continue trending down to 11,3 from 14. Cultures have  been no growth, will continue antibiotic therapy with IV ceftriaxone. Her blood pressure has maintained a systolic of 790 mmHg, will hold on IV fluids for now.   2. Metabolic encephalopathy in the setting of dementia. Patient continue to be confused but not agitated. Per her son she has been experiencing a rapid decline in her functional status, she has been non ambulatory and has requested palliative care services, possible dc to hospice once infection more controlled. Will continue with donepezil, duloxetine and as needed lorazepam.  3. COPD. Continue oxymetry monitoring, no clinical signs of exacerbation.  4. Diastolic heart failure. Clinically euvolemic will hold on further  IV fluids. Continue blood pressure monitoring.  5. Hx of CVA. Patient has been non ambulatory per her son's report, will continue physical therapy.   DVT prophylaxis: enoxaparin   Code Status:  full Family Communication: no family at the bedside  Disposition Plan/ discharge barriers: pending clinical improvement, possible home hospice, pending palliative care consult.   Body mass index is 20.85 kg/m. Malnutrition Type:  Nutrition Problem: Inadequate oral intake Etiology: acute illness, lethargy/confusion   Malnutrition Characteristics:  Signs/Symptoms: meal completion < 25%   Nutrition Interventions:  Interventions: Ensure Enlive (each supplement provides 350kcal and 20 grams of protein), Magic cup, MVI  RN Pressure Injury Documentation:     Consultants:     Procedures:     Antimicrobials:   IV ceftriaxone     Subjective: Patient continue to be confused and disorientated, this am complains of ankle pain and difficulty ambulating, poor oral intake. No dyspnea or chest pain.   Objective: Vitals:   12/28/18 1054 12/28/18 1235 12/28/18  2200 12/29/18 0419  BP: 123/63 (!) 99/50 (!) 131/58 139/62  Pulse: 70 69 70 79  Resp: 16 18 14 15   Temp:  97.7 F (36.5 C) 97.8 F (36.6 C) 98.1 F  (36.7 C)  TempSrc:  Oral Oral Oral  SpO2: 100% 100% 100% 100%  Weight:    55.1 kg  Height:        Intake/Output Summary (Last 24 hours) at 12/29/2018 1012 Last data filed at 12/29/2018 3151 Gross per 24 hour  Intake 1485.58 ml  Output 725 ml  Net 760.58 ml   Filed Weights   12/27/18 2328 12/28/18 0445 12/29/18 0419  Weight: 52.3 kg 54 kg 55.1 kg    Examination:   General: Not in pain or dyspnea, deconditioned  Neurology: Awake and alert, non focal  E ENT: mild pallor, no icterus, oral mucosa moist Cardiovascular: No JVD. S1-S2 present, rhythmic, no gallops, rubs, or murmurs. No lower extremity edema. Pulmonary: positive breath sounds bilaterally, adequate air movement, no wheezing, rhonchi or rales. Gastrointestinal. Abdomen with no organomegaly, non tender, no rebound or guarding Skin. No rashes Musculoskeletal: no joint deformities/ ankle tenderness but not deformities or erythema.      Data Reviewed: I have personally reviewed following labs and imaging studies  CBC: Recent Labs  Lab 12/27/18 1704 12/28/18 0340 12/29/18 0200  WBC 13.9* 14.1* 11.3*  NEUTROABS 11.3* 11.3* 8.3*  HGB 9.2* 9.4* 9.0*  HCT 29.3* 30.8* 29.6*  MCV 100.7* 104.1* 103.9*  PLT 328 464* 761*   Basic Metabolic Panel: Recent Labs  Lab 12/27/18 1704 12/28/18 0340 12/29/18 0200  NA 133* 138 136  K 4.2 3.8 3.6  CL 93* 100 101  CO2 28 28 25   GLUCOSE 119* 115* 97  BUN 11 14 13   CREATININE 0.71 0.71 0.55  CALCIUM 9.0 8.5* 8.4*   GFR: Estimated Creatinine Clearance: 46.8 mL/min (by C-G formula based on SCr of 0.55 mg/dL). Liver Function Tests: Recent Labs  Lab 12/27/18 1704  AST 8*  ALT 43  ALKPHOS 202*  BILITOT 0.9  PROT 6.8  ALBUMIN 2.6*   No results for input(s): LIPASE, AMYLASE in the last 168 hours. No results for input(s): AMMONIA in the last 168 hours. Coagulation Profile: No results for input(s): INR, PROTIME in the last 168 hours. Cardiac Enzymes: Recent Labs  Lab  12/27/18 1704 12/27/18 2158  TROPONINI 0.04* 0.04*   BNP (last 3 results) No results for input(s): PROBNP in the last 8760 hours. HbA1C: No results for input(s): HGBA1C in the last 72 hours. CBG: No results for input(s): GLUCAP in the last 168 hours. Lipid Profile: Recent Labs    12/27/18 1704  TRIG 73   Thyroid Function Tests: No results for input(s): TSH, T4TOTAL, FREET4, T3FREE, THYROIDAB in the last 72 hours. Anemia Panel: Recent Labs    12/27/18 1704  FERRITIN 304      Radiology Studies: I have reviewed all of the imaging during this hospital visit personally     Scheduled Meds: . donepezil  10 mg Oral QHS  . DULoxetine  60 mg Oral Daily  . enoxaparin (LOVENOX) injection  40 mg Subcutaneous QHS  . famotidine  20 mg Oral BID  . feeding supplement (ENSURE ENLIVE)  237 mL Oral BID BM  . multivitamin with minerals  1 tablet Oral Daily  . naloxegol oxalate  12.5 mg Oral Daily  . oxyCODONE  20 mg Oral BID  . polyethylene glycol  17 g Oral Daily  . senna-docusate  2  tablet Oral BID  . sodium chloride flush  3 mL Intravenous Q12H  . sodium chloride flush  3 mL Intravenous Q12H   Continuous Infusions: . sodium chloride 250 mL (12/28/18 0221)  . sodium chloride 75 mL/hr at 12/29/18 0248  . cefTRIAXone (ROCEPHIN)  IV 1 g (12/28/18 1733)     LOS: 1 day        Darthy Manganelli Gerome Apley, MD

## 2018-12-30 LAB — CBC WITH DIFFERENTIAL/PLATELET
Abs Immature Granulocytes: 0.03 10*3/uL (ref 0.00–0.07)
Basophils Absolute: 0 10*3/uL (ref 0.0–0.1)
Basophils Relative: 0 %
Eosinophils Absolute: 0 10*3/uL (ref 0.0–0.5)
Eosinophils Relative: 0 %
HCT: 29.9 % — ABNORMAL LOW (ref 36.0–46.0)
Hemoglobin: 9.5 g/dL — ABNORMAL LOW (ref 12.0–15.0)
Immature Granulocytes: 0 %
Lymphocytes Relative: 12 %
Lymphs Abs: 1.2 10*3/uL (ref 0.7–4.0)
MCH: 31.5 pg (ref 26.0–34.0)
MCHC: 31.8 g/dL (ref 30.0–36.0)
MCV: 99 fL (ref 80.0–100.0)
Monocytes Absolute: 0.5 10*3/uL (ref 0.1–1.0)
Monocytes Relative: 5 %
Neutro Abs: 8 10*3/uL — ABNORMAL HIGH (ref 1.7–7.7)
Neutrophils Relative %: 83 %
Platelets: 446 10*3/uL — ABNORMAL HIGH (ref 150–400)
RBC: 3.02 MIL/uL — ABNORMAL LOW (ref 3.87–5.11)
RDW: 12.8 % (ref 11.5–15.5)
WBC: 9.7 10*3/uL (ref 4.0–10.5)
nRBC: 0 % (ref 0.0–0.2)

## 2018-12-30 LAB — BASIC METABOLIC PANEL
Anion gap: 6 (ref 5–15)
BUN: 12 mg/dL (ref 8–23)
CO2: 28 mmol/L (ref 22–32)
Calcium: 8.6 mg/dL — ABNORMAL LOW (ref 8.9–10.3)
Chloride: 103 mmol/L (ref 98–111)
Creatinine, Ser: 0.56 mg/dL (ref 0.44–1.00)
GFR calc Af Amer: >60 mL/min (ref >60)
GFR calc non Af Amer: >60 mL/min (ref >60)
Glucose, Bld: 98 mg/dL (ref 70–99)
Potassium: 4.9 mmol/L (ref 3.5–5.1)
Sodium: 137 mmol/L (ref 135–145)

## 2018-12-30 MED ORDER — CEFUROXIME AXETIL 500 MG PO TABS: 500.0000 mg | ORAL_TABLET | Freq: Two times a day (BID) | ORAL | 0 refills | Status: AC

## 2018-12-30 NOTE — Progress Notes (Addendum)
Palliative Medicine RN Note: Rec'd request from PMT MD to assist with hospice referral to [place] for home hospice at [facility]. I called their liaison and notified her of referral. RNCM or SW will need to assist with actual discharge and transportation coordination.   Yolanda Skiff Beckhem Isadore, RN, BSN, [facility] Palliative Medicine Team 12/30/2018 8:44 AM Office [phone]   ADDENDUM: rec'd call back from [place]. [facility] is aware of referral. Pt's apartment is being renovated, so [facility] is working with family & [place] to determine best location for care after d/c.  Yolanda Skiff Naythan Douthit, RN, BSN, [facility] Palliative Medicine Team 12/30/2018 9:25 AM Office [phone]

## 2018-12-30 NOTE — TOC Transition Note (Signed)
Transition of Care [facility]) - CM/SW Discharge Note   Patient Details  Name: Yolanda [name] MRN: [id number] Date of Birth: [birth year]  Transition of Care [facility]) CM/SW Contact:  Wende Neighbors, LCSW Phone Number: 12/30/2018, 4:25 PM   Clinical Narrative:   Patient going home with [facility] following. Family aware of discharge and agreeable  . [facility] called for pickup          Patient Goals and CMS Choice        Discharge Placement                       Discharge Plan and Services                                     Social Determinants of Health (SDOH) Interventions     Readmission Risk Interventions No flowsheet data found.

## 2018-12-30 NOTE — Progress Notes (Signed)
PMT progress note  Patient is resting in bed, some what more confused today. In no distress.  BP 126/78 (BP Location: Left Arm)   Pulse 72   Temp 99 F (37.2 C) (Oral)   Resp 16   Ht 5\' 4"  (1.626 m)   Wt 56.6 kg   SpO2 100%   BMI 21.42 kg/m  Labs and cultures reviewed D.C summary reviewed.   PMT RN has reached out to [facility] this am. Arrangements are underway, for the patient to have hospice support after discharge.   Resting comfortably Denies pain Regular S1 S2 Frail lady Appears with generalized weakness Abdomen is not distended  PPS 30%  A/P: Dementia UTI Hip fracture few months ago Ongoing progressive decline.   Plan: Home with hospice support Prognosis less than 6 months, in my opinion.  No additional PMT specific recommendations other than as above.   15 minutes spent Loistine Chance MD [facility] health palliative medicine team [phone]

## 2018-12-30 NOTE — Evaluation (Signed)
Physical Therapy One Time Evaluation Patient Details Name: AVIANA SHEVLIN MRN: [id number] DOB: July 11, 1934 Today's Date: 12/30/2018   History of Present Illness  Pt is an 85 year old female with PMHx significant for L femur IM nailing 09/09/18, dementia, CVA, CHF, breast cancer and admitted for UTI  Clinical Impression  Patient evaluated by Physical Therapy with no further acute PT needs identified. All education has been completed and the patient has no further questions. Pt requiring total assist for bed mobility at this time. Pt reports increased weakness and pain with movement.  Pt states, "I'm going to need 2 people" and then "I think I'll need a w/c."  Pt appears familiar with PT from her comments however difficult to communicate due to [facility] and poor vision.  D/C plans were not discussed with pt however per chart, plan appears to be home with family and hospice services.  Pt will need increased assist and DME upon d/c.  Recommend SNF if hospice is not initiated.  PT is signing off. Thank you for this referral.     Follow Up Recommendations Supervision/Assistance - 24 hour;SNF    Equipment Recommendations  Wheelchair (measurements PT);Hospital bed;Wheelchair cushion (measurements PT)    Recommendations for Other Services       Precautions / Restrictions Precautions Precautions: Fall Precaution Comments: L UE hemiparesis, extremely HOH and poor vision Restrictions Weight Bearing Restrictions: No      Mobility  Bed Mobility Overal bed mobility: Needs Assistance Bed Mobility: Supine to Sit;Sit to Supine     Supine to sit: Total assist;+2 for physical assistance Sit to supine: Total assist;+2 for physical assistance   General bed mobility comments: multimodal cues however difficult to communicate at this time as written communication best however required all hands to assist pt, pt did attempt to mobilize however reports being weak and having pain  Transfers                    Ambulation/Gait                Stairs            Wheelchair Mobility    Modified Rankin (Stroke Patients Only)       Balance Overall balance assessment: Needs assistance Sitting-balance support: Bilateral upper extremity supported;Feet unsupported Sitting balance-Leahy Scale: Zero Sitting balance - Comments: pt unable to achieve fully erect posture, pt leaning trunk against elevated HOB                                     Pertinent Vitals/Pain Pain Assessment: Faces Faces Pain Scale: Hurts even more Pain Location: "arthritis pain" also reports R ankle pain Pain Descriptors / Indicators: Sore;Grimacing;Guarding;Tender Pain Intervention(s): Monitored during session;Repositioned;Premedicated before session    Home Living Family/patient expects to be discharged to:: Skilled nursing facility                 Additional Comments: from [facility]- has 24/7 caregiver    Prior Function Level of Independence: Needs assistance   Gait / Transfers Assistance Needed: uses Rollator with assistance prior to L IM nail in Jan, pt reports being able to ambulate with walker today however uncertain of accuracy           Hand Dominance        Extremity/Trunk Assessment   Upper Extremity Assessment Upper Extremity Assessment: LUE deficits/detail LUE Deficits / Details: observed  limited use of L UE, pt did not self assist L UE either so provided support/placement of L UE with movement    Lower Extremity Assessment Lower Extremity Assessment: Generalized weakness       Communication   Communication: HOH  Cognition Arousal/Alertness: Awake/alert Behavior During Therapy: WFL for tasks assessed/performed Overall Cognitive Status: Difficult to assess                                 General Comments: pt extremely HOH, attempted written communication however pt also with poor vision; pt does make appropriate comments when she  understands what is being communicated      General Comments      Exercises     Assessment/Plan    PT Assessment All further PT needs can be met in the next venue of care  PT Problem List Decreased strength;Decreased balance;Decreased activity tolerance;Decreased mobility;Pain       PT Treatment Interventions      PT Goals (Current goals can be found in the Care Plan section)  Acute Rehab PT Goals PT Goal Formulation: All assessment and education complete, DC therapy    Frequency     Barriers to discharge        Co-evaluation               AM-PAC PT "6 Clicks" Mobility  Outcome Measure Help needed turning from your back to your side while in a flat bed without using bedrails?: Total Help needed moving from lying on your back to sitting on the side of a flat bed without using bedrails?: Total Help needed moving to and from a bed to a chair (including a wheelchair)?: Total Help needed standing up from a chair using your arms (e.g., wheelchair or bedside chair)?: Total Help needed to walk in hospital room?: Total Help needed climbing 3-5 steps with a railing? : Total 6 Click Score: 6    End of Session   Activity Tolerance: Patient limited by fatigue;Patient limited by pain Patient left: in bed;with call bell/phone within reach;with bed alarm set Nurse Communication: Mobility status PT Visit Diagnosis: Other abnormalities of gait and mobility (R26.89);Muscle weakness (generalized) (M62.81)    Time: 9147-8295 PT Time Calculation (min) (ACUTE ONLY): 22 min   Charges:   PT Evaluation $PT Eval Low Complexity: [place], PT, DPT Acute Rehabilitation Services Office: [phone] Pager: [phone]  Trena Platt 12/30/2018, 12:21 PM

## 2018-12-30 NOTE — Discharge Summary (Signed)
Physician Discharge Summary  Yolanda [name] OTL:[id number] DOB: 10/06/1933 DOA: 12/27/2018  PCP: Javier Glazier, MD  Admit date: 12/27/2018 Discharge date: 12/30/2018  Admitted From: Skilled nursing facility Disposition: Skilled nursing facility  Recommendations for Outpatient Follow-up:  1. Follow up with PCP in 1-2 weeks 2. Please obtain BMP/CBC in one week 3. Please follow up on the following pending results:  Home Health: No Equipment/Devices: None  Discharge Condition: Stable CODE STATUS: DNR Diet recommendation: Heart healthy  Subjective: Patient seen and examined.  She is very hard of hearing.  She is alert but confused due to her advanced dementia.  This is her baseline.  She had no complaints.  Brief/Interim Summary: 85 year old female who presented with fevers. She does have significant past medical history of dementia, history of CVA, emphysema, chronic pain syndrome and diastolic heart failure. Patient has been recently diagnosed with a urinary tract infection, she has been taking antibiotics for about 4 days, with persistent fevers.Apparently she has been disoriented and having more difficulty while ambulating. On her initial physical examination blood pressure 113/55, heart rate 85, respiratory rate 19, oxygen saturation 99%. She had moist mucous membranes, her lungs were clear to auscultation bilaterally, heart S1-S2 present and rhythmic, the abdomen was soft nontender, no lower extremity edema, she was unable to answer any questions. Sodium 133, potassium 4.2, chloride 93, bicarb 28, glucose 119, BUN 11, creatinine 0.71, white count 13.9, hemoglobin 9.2, hematocrit 29.3, platelets 328,SARS COVID-19 was negative,urinalysis had 11-20 white cells. Chest radiograph was negative for infiltrates. EKG 79 bpm, left axis, normal intervals, poor R wave progression, no ST segment or T wave changes. Patient was admitted to the hospital with a working diagnosis of sepsis due  to urinary tract infection, failed outpatient therapy.  She was treated for sepsis per protocol and started on IV Rocephin and urine culture was sent which came back negative thus far.  Patient presented with sepsis parameters which have resolved thus far.  Patient has remained confused at her baseline without any behavioral issues and she has remained calm and pleasant.  Patient's children were already looking into hospice and they were in talks with [facility] hospice.  We consulted palliative care here who had eventually discussed with the patient's family and they agreed on discharging the patient and [facility] will take over from there.  I saw patient today for the first time.  She again is confused but pleasant.  As mentioned above, her sepsis parameters have resolved.  Due to negative urine culture and blood culture, I have elected to send her on Ceftin 500 mg p.o. twice daily for 5 days as a continue treatment of her UTI.  I also called patient's son Ilona Sorrel at [phone] and discussed plan of care and the discharge plan with him and he was in agreement.  Discharge Diagnoses:  Principal Problem:   Sepsis secondary to UTI [facility]) Active Problems:   Dementia without behavioral disturbance (HCC)   COPD (chronic obstructive pulmonary disease) (HCC)   Chronic diastolic CHF (congestive heart failure) (HCC)   History of CVA (cerebrovascular accident)   Macrocytic anemia   Chronic pain   Palliative care by specialist   Goals of care, counseling/discussion    Discharge Instructions  Discharge Instructions    Discharge patient   Complete by:  As directed    Discharge disposition:  03-Skilled [place]   Discharge patient date:  12/30/2018     Allergies as of 12/30/2018   No Known Allergies  Medication List    TAKE these medications   acetaminophen 500 MG tablet Commonly known as:  TYLENOL Take 1,000 mg by mouth every 8 (eight) hours as needed.   cefUROXime 500 MG  tablet Commonly known as:  CEFTIN Take 1 tablet (500 mg total) by mouth 2 (two) times daily for 10 doses.   cyclobenzaprine 5 MG tablet Commonly known as:  FLEXERIL Take 5 mg by mouth 3 (three) times daily as needed for muscle spasms.   donepezil 10 MG tablet Commonly known as:  ARICEPT Take 10 mg by mouth at bedtime.   DULoxetine 60 MG capsule Commonly known as:  CYMBALTA Take 60 mg by mouth daily.   enoxaparin 30 MG/0.3ML injection Commonly known as:  LOVENOX Inject 0.3 mLs (30 mg total) into the skin daily for 10 days. Then take one 81 mg aspirin once a day for three weeks. Then discontinue aspirin.   famotidine 20 MG tablet Commonly known as:  PEPCID Take 1 tablet (20 mg total) by mouth 2 (two) times daily.   feeding supplement (ENSURE ENLIVE) Liqd Take 237 mLs by mouth 2 (two) times daily between meals.   folic acid 1 MG tablet Commonly known as:  FOLVITE Take 1 tablet (1 mg total) by mouth daily.   furosemide 40 MG tablet Commonly known as:  LASIX Take 1 tablet (40 mg total) by mouth daily. What changed:    when to take this  reasons to take this   HYDROcodone-acetaminophen 5-325 MG tablet Commonly known as:  NORCO/VICODIN Take 1-2 tablets by mouth every 6 (six) hours as needed for moderate pain.   LORazepam 0.5 MG tablet Commonly known as:  ATIVAN Take 1 tablet (0.5 mg total) by mouth every 8 (eight) hours as needed for anxiety.   methocarbamol 500 MG tablet Commonly known as:  ROBAXIN Take 1 tablet (500 mg total) by mouth every 6 (six) hours as needed for muscle spasms.   metoprolol succinate 25 MG 24 hr tablet Commonly known as:  Toprol XL Take 1 tablet (25 mg total) by mouth daily. Take with or immediately following a meal.   Movantik 12.5 MG Tabs tablet Generic drug:  naloxegol oxalate Take 1 tablet (12.5 mg total) by mouth daily.   multivitamin with minerals Tabs tablet Take 1 tablet by mouth daily.   oxyCODONE 20 mg 12 hr tablet Commonly  known as:  OXYCONTIN Take 1 tablet (20 mg total) by mouth 2 (two) times daily.   polyethylene glycol 17 g packet Commonly known as:  MIRALAX / GLYCOLAX Take 17 g by mouth daily.   sacubitril-valsartan 49-51 MG Commonly known as:  ENTRESTO Take 1 tablet by mouth 2 (two) times daily.   senna-docusate 8.6-50 MG tablet Commonly known as:  Senokot-S Take 2 tablets by mouth 2 (two) times daily.   thiamine 100 MG tablet Take 1 tablet (100 mg total) by mouth daily.   umeclidinium bromide 62.5 MCG/INH Aepb Commonly known as:  Incruse Ellipta Inhale 1 puff into the lungs daily.   Vitamin D3 1.25 MG (50000 UT) Caps Take 50,000 Units by mouth every Friday.      Follow-up Information    Javier Glazier, MD Follow up in 1 week(s).   Specialty:  Internal Medicine Contact information: [place] [address] [phone]        Pixie Casino, MD .   Specialty:  Cardiology Contact information: [address] [place] [address] [phone]  No Known Allergies  Consultations: None   Procedures/Studies: Dg Chest Port 1 View  Result Date: 12/27/2018 CLINICAL DATA:  Fever.  Altered mental status. EXAM: PORTABLE CHEST 1 VIEW COMPARISON:  09/07/2018 and prior radiographs FINDINGS: Mild cardiomegaly again noted. There is no evidence of focal airspace disease, pulmonary edema, suspicious pulmonary nodule/mass, pleural effusion, or pneumothorax. No acute bony abnormalities are identified. Thoracic stimulator, severe degenerative changes in the LEFT shoulder and bilateral axillary surgical clips again noted. IMPRESSION: Mild cardiomegaly without evidence of acute cardiopulmonary disease. Electronically Signed   By: Margarette Canada M.D.   On: 12/27/2018 17:50      Discharge Exam: Vitals:   12/29/18 2056 12/30/18 0429  BP: (!) 144/66 126/78  Pulse: 90 72  Resp: 18 16  Temp: 98.1 F (36.7 C) 99 F (37.2 C)  SpO2: 97% 100%   Vitals:    12/29/18 0419 12/29/18 1424 12/29/18 2056 12/30/18 0429  BP: 139/62 130/63 (!) 144/66 126/78  Pulse: 79 91 90 72  Resp: 15 18 18 16   Temp: 98.1 F (36.7 C) 98.3 F (36.8 C) 98.1 F (36.7 C) 99 F (37.2 C)  TempSrc: Oral Axillary  Oral  SpO2: 100% 93% 97% 100%  Weight: 55.1 kg   56.6 kg  Height:        General: Pt is alert, awake, not in acute distress but confused Cardiovascular: RRR, S1/S2 +, no rubs, no gallops Respiratory: CTA bilaterally, no wheezing, no rhonchi Abdominal: Soft, NT, ND, bowel sounds + Extremities: no edema, no cyanosis    The results of significant diagnostics from this hospitalization (including imaging, microbiology, ancillary and laboratory) are listed below for reference.     Microbiology: Recent Results (from the past 240 hour(s))  Blood Culture (routine x 2)     Status: None (Preliminary result)   Collection Time: 12/27/18  5:04 PM  Result Value Ref Range Status   Specimen Description   Final    BLOOD LEFT ANTECUBITAL Performed at [facility] [address] [place], [place] [address]    Special Requests   Final    BOTTLES DRAWN AEROBIC AND ANAEROBIC Blood Culture results may not be optimal due to an inadequate volume of blood received in culture bottles Performed at [facility] [address], [place], [place] [address]    Culture   Final    NO GROWTH 2 DAYS Performed at [facility] [address], [place], [place] [address]    Report Status PENDING  Incomplete  Blood Culture (routine x 2)     Status: None (Preliminary result)   Collection Time: 12/27/18  5:15 PM  Result Value Ref Range Status   Specimen Description   Final    BLOOD RIGHT ANTECUBITAL Performed at [facility] [address] [place], [place] [address]    Special Requests   Final    BOTTLES DRAWN AEROBIC AND ANAEROBIC Blood Culture results may not be optimal due to an inadequate volume of blood received  in culture bottles Performed at [facility] [address], [place], [place] [address]    Culture   Final    NO GROWTH 2 DAYS Performed at [facility] [address] [place],  [address]    Report Status PENDING  Incomplete  SARS Coronavirus 2 [facility] order, Performed in [place] hospital lab)     Status: None   Collection Time: 12/27/18  5:30 PM  Result Value Ref  Range Status   SARS Coronavirus 2 NEGATIVE NEGATIVE Final    Comment: (NOTE) If result is NEGATIVE SARS-CoV-2 target nucleic acids are NOT DETECTED. The SARS-CoV-2 RNA is generally detectable in upper and lower  respiratory specimens during the acute phase of infection. The lowest  concentration of SARS-CoV-2 viral copies this assay can detect is 250  copies / mL. A negative result does not preclude SARS-CoV-2 infection  and should not be used as the sole basis for treatment or other  patient management decisions.  A negative result may occur with  improper specimen collection / handling, submission of specimen other  than nasopharyngeal swab, presence of viral mutation(s) within the  areas targeted by this assay, and inadequate number of viral copies  (<250 copies / mL). A negative result must be combined with clinical  observations, patient history, and epidemiological information. If result is POSITIVE SARS-CoV-2 target nucleic acids are DETECTED. The SARS-CoV-2 RNA is generally detectable in upper and lower  respiratory specimens dur ing the acute phase of infection.  Positive  results are indicative of active infection with SARS-CoV-2.  Clinical  correlation with patient history and other diagnostic information is  necessary to determine patient infection status.  Positive results do  not rule out bacterial infection or co-infection with other viruses. If result is PRESUMPTIVE POSTIVE SARS-CoV-2 nucleic acids MAY BE PRESENT.   A presumptive positive result was obtained on  the submitted specimen  and confirmed on repeat testing.  While 2019 novel coronavirus  (SARS-CoV-2) nucleic acids may be present in the submitted sample  additional confirmatory testing may be necessary for epidemiological  and / or clinical management purposes  to differentiate between  SARS-CoV-2 and other Sarbecovirus currently known to infect humans.  If clinically indicated additional testing with an alternate test  methodology [phone]) is advised. The SARS-CoV-2 RNA is generally  detectable in upper and lower respiratory sp ecimens during the acute  phase of infection. The expected result is Negative. Fact Sheet for Patients:  StrictlyIdeas.no Fact Sheet for Healthcare Providers: BankingDealers.co.za This test is not yet approved or cleared by the [place] FDA and has been authorized for detection and/or diagnosis of SARS-CoV-2 by FDA under an Emergency Use Authorization (EUA).  This EUA will remain in effect (meaning this test can be used) for the duration of the COVID-19 declaration under Section 564(b)(1) of the Act, 21 U.S.C. section 360bbb-3(b)(1), unless the authorization is terminated or revoked sooner. Performed at [facility], [place] [address] [place], [place] [address]   Urine culture     Status: None   Collection Time: 12/27/18  8:43 PM  Result Value Ref Range Status   Specimen Description   Final    Urine Performed at [facility] [address], [place], [place] [address]    Special Requests   Final    NONE Performed at [facility], [place] [address] [place], [place] [address]    Culture   Final    NO GROWTH Performed at [facility], [place] [address] [place], [place] [address]    Report Status 12/28/2018 FINAL  Final  MRSA PCR Screening     Status: None   Collection Time: 12/29/18 10:49 AM  Result Value Ref Range Status   MRSA by PCR  NEGATIVE NEGATIVE Final    Comment:        The GeneXpert MRSA Assay (FDA approved for NASAL specimens only), is one component of a comprehensive  MRSA colonization surveillance program. It is not intended to diagnose MRSA infection nor to guide or monitor treatment for MRSA infections. Performed at [facility], [place] [address], [place], [place] [address]      Labs: BNP (last 3 results) Recent Labs    07/01/18 1058 12/27/18 1704  BNP 862.1* 99.3   Basic Metabolic Panel: Recent Labs  Lab 12/27/18 1704 12/28/18 0340 12/29/18 0200 12/30/18 0425  NA 133* 138 136 137  K 4.2 3.8 3.6 4.9  CL 93* 100 101 103  CO2 28 28 25 28   GLUCOSE 119* 115* 97 98  BUN 11 14 13 12   CREATININE 0.71 0.71 0.55 0.56  CALCIUM 9.0 8.5* 8.4* 8.6*   Liver Function Tests: Recent Labs  Lab 12/27/18 1704  AST 8*  ALT 43  ALKPHOS 202*  BILITOT 0.9  PROT 6.8  ALBUMIN 2.6*   No results for input(s): LIPASE, AMYLASE in the last 168 hours. No results for input(s): AMMONIA in the last 168 hours. CBC: Recent Labs  Lab 12/27/18 1704 12/28/18 0340 12/29/18 0200 12/30/18 0425  WBC 13.9* 14.1* 11.3* 9.7  NEUTROABS 11.3* 11.3* 8.3* 8.0*  HGB 9.2* 9.4* 9.0* 9.5*  HCT 29.3* 30.8* 29.6* 29.9*  MCV 100.7* 104.1* 103.9* 99.0  PLT 328 464* 457* 446*   Cardiac Enzymes: Recent Labs  Lab 12/27/18 1704 12/27/18 2158  TROPONINI 0.04* 0.04*   BNP: Invalid input(s): POCBNP CBG: No results for input(s): GLUCAP in the last 168 hours. D-Dimer Recent Labs    12/27/18 1704  DDIMER 3.82*   Hgb A1c No results for input(s): HGBA1C in the last 72 hours. Lipid Profile Recent Labs    12/27/18 1704  TRIG 73   Thyroid function studies No results for input(s): TSH, T4TOTAL, T3FREE, THYROIDAB in the last 72 hours.  Invalid input(s): FREET3 Anemia work up Recent Labs    12/27/18 1704  FERRITIN 304   Urinalysis    Component Value Date/Time   COLORURINE AMBER (A)  12/27/2018 2043   APPEARANCEUR CLEAR 12/27/2018 2043   LABSPEC 1.018 12/27/2018 2043   [place] 8.0 12/27/2018 2043   GLUCOSEU NEGATIVE 12/27/2018 2043   HGBUR NEGATIVE 12/27/2018 2043   BILIRUBINUR NEGATIVE 12/27/2018 2043   BILIRUBINUR neg 01/23/2012 1445   KETONESUR 5 (A) 12/27/2018 2043   PROTEINUR 30 (A) 12/27/2018 2043   UROBILINOGEN 0.2 01/23/2012 1445   NITRITE NEGATIVE 12/27/2018 2043   LEUKOCYTESUR NEGATIVE 12/27/2018 2043   Sepsis Labs Invalid input(s): PROCALCITONIN,  WBC,  LACTICIDVEN Microbiology Recent Results (from the past 240 hour(s))  Blood Culture (routine x 2)     Status: None (Preliminary result)   Collection Time: 12/27/18  5:04 PM  Result Value Ref Range Status   Specimen Description   Final    BLOOD LEFT ANTECUBITAL Performed at [facility], [place] [address], [place], [place] [address]    Special Requests   Final    BOTTLES DRAWN AEROBIC AND ANAEROBIC Blood Culture results may not be optimal due to an inadequate volume of blood received in culture bottles Performed at [facility] [address], [place], [place] [address]    Culture   Final    NO GROWTH 2 DAYS Performed at [facility] [address], [place], [place] [address]    Report Status PENDING  Incomplete  Blood Culture (routine x 2)     Status: None (Preliminary result)   Collection Time: 12/27/18  5:15 PM  Result Value Ref  Range Status   Specimen Description   Final    BLOOD RIGHT ANTECUBITAL Performed at [facility] [address], [place], [place] [address]    Special Requests   Final    BOTTLES DRAWN AEROBIC AND ANAEROBIC Blood Culture results may not be optimal due to an inadequate volume of blood received in culture bottles Performed at [facility] [address], [place], [place] [address]    Culture   Final    NO GROWTH 2 DAYS Performed at [facility] [address] [place],  [place] [address]    Report Status PENDING  Incomplete  SARS Coronavirus 2 [facility] order, Performed in [place] hospital lab)     Status: None   Collection Time: 12/27/18  5:30 PM  Result Value Ref Range Status   SARS Coronavirus 2 NEGATIVE NEGATIVE Final    Comment: (NOTE) If result is NEGATIVE SARS-CoV-2 target nucleic acids are NOT DETECTED. The SARS-CoV-2 RNA is generally detectable in upper and lower  respiratory specimens during the acute phase of infection. The lowest  concentration of SARS-CoV-2 viral copies this assay can detect is 250  copies / mL. A negative result does not preclude SARS-CoV-2 infection  and should not be used as the sole basis for treatment or other  patient management decisions.  A negative result may occur with  improper specimen collection / handling, submission of specimen other  than nasopharyngeal swab, presence of viral mutation(s) within the  areas targeted by this assay, and inadequate number of viral copies  (<250 copies / mL). A negative result must be combined with clinical  observations, patient history, and epidemiological information. If result is POSITIVE SARS-CoV-2 target nucleic acids are DETECTED. The SARS-CoV-2 RNA is generally detectable in upper and lower  respiratory specimens dur ing the acute phase of infection.  Positive  results are indicative of active infection with SARS-CoV-2.  Clinical  correlation with patient history and other diagnostic information is  necessary to determine patient infection status.  Positive results do  not rule out bacterial infection or co-infection with other viruses. If result is PRESUMPTIVE POSTIVE SARS-CoV-2 nucleic acids MAY BE PRESENT.   A presumptive positive result was obtained on the submitted specimen  and confirmed on repeat testing.  While 2019 novel coronavirus  (SARS-CoV-2) nucleic acids may be present in the submitted sample  additional confirmatory testing may be necessary for  epidemiological  and / or clinical management purposes  to differentiate between  SARS-CoV-2 and other Sarbecovirus currently known to infect humans.  If clinically indicated additional testing with an alternate test  methodology [phone]) is advised. The SARS-CoV-2 RNA is generally  detectable in upper and lower respiratory sp ecimens during the acute  phase of infection. The expected result is Negative. Fact Sheet for Patients:  StrictlyIdeas.no Fact Sheet for Healthcare Providers: BankingDealers.co.za This test is not yet approved or cleared by the [place] FDA and has been authorized for detection and/or diagnosis of SARS-CoV-2 by FDA under an Emergency Use Authorization (EUA).  This EUA will remain in effect (meaning this test can be used) for the duration of the COVID-19 declaration under Section 564(b)(1) of the Act, 21 U.S.C. section 360bbb-3(b)(1), unless the authorization is terminated or revoked sooner. Performed at [facility], [place] [address], [place], [place] [address]   Urine culture     Status: None   Collection Time: 12/27/18  8:43 PM  Result Value Ref Range Status  Specimen Description   Final    Urine Performed at [facility] [address], [place], [place] [address]    Special Requests   Final    NONE Performed at [facility], [place] [address], [place], [place] [address]    Culture   Final    NO GROWTH Performed at [facility], [place] [address], [place], [place] [address]    Report Status 12/28/2018 FINAL  Final  MRSA PCR Screening     Status: None   Collection Time: 12/29/18 10:49 AM  Result Value Ref Range Status   MRSA by PCR NEGATIVE NEGATIVE Final    Comment:        The GeneXpert MRSA Assay (FDA approved for NASAL specimens only), is one component of a comprehensive MRSA colonization surveillance program. It is not intended to  diagnose MRSA infection nor to guide or monitor treatment for MRSA infections. Performed at [facility], [place] [address] [place], [place] [address]      Time coordinating discharge: 35 minutes  SIGNED:   Darliss Cheney, MD  [facility] 12/30/2018, 10:14 AM Pager [phone]  If 7PM-7AM, please contact night-coverage [email or web address] Password [email or web address]

## 2018-12-30 NOTE — Progress Notes (Signed)
Pt is very HOH. Her hearing aid batteries are dead. Reached out to family and they are going to bring replacement batteries. Written communication is best at this time. Will continue to monitor.

## 2018-12-30 NOTE — Discharge Instructions (Signed)
Asymptomatic Bacteriuria  Asymptomatic bacteriuria is the presence of a large number of bacteria in the urine without the usual symptoms of burning or frequent urination. What are the causes? This condition is caused by an increase in bacteria in the urine. This increase can be caused by:  Bacteria entering the urinary tract, such as during sex.  A blockage in the urinary tract, such as from kidney stones or a tumor.  Bladder problems that prevent the bladder from emptying. What increases the risk? You are more likely to develop this condition if:  You have diabetes mellitus.  You are an elderly adult, especially if you are also in a long-term care facility.  You are pregnant and in the first trimester.  You have kidney stones.  You are female.  You have had a kidney transplant.  You have a leaky kidney tube valve (reflux).  You had a urinary catheter for a long period of time. What are the signs or symptoms? There are no symptoms of this condition. How is this diagnosed? This condition is diagnosed with a urine test. Because this condition does not cause symptoms, it is usually diagnosed when a urine sample is taken to treat or diagnose another condition, such as pregnancy or kidney problems. Most women who are in their first trimester of pregnancy are screened for asymptomatic bacteriuria. How is this treated? Usually, treatment is not needed for this condition. Treating the condition can lead to other problems, such as a yeast infection or the growth of bacteria that do not respond to treatment (antibiotic-resistant bacteria). Some people, such as pregnant women and people with kidney transplants, do need treatment with antibiotic medicines to prevent kidney infection (pyelonephritis). In pregnant women, kidney infection can lead to premature labor, fetal growth restriction, or newborn death. Follow these instructions at home: Medicines  Take over-the-counter and prescription  medicines only as told by your health care provider.  If you were prescribed an antibiotic medicine, take it as told by your health care provider. Do not stop taking the antibiotic even if you start to feel better. General instructions  Monitor your condition for any changes.  Drink enough fluid to keep your urine clear or pale yellow.  Go to the bathroom more often to keep your bladder empty.  If you are female, keep the area around your vagina and rectum clean. Wipe yourself from front to back after urinating.  Keep all follow-up visits as told by your health care provider. This is important. Contact a health care provider if:  You notice any new symptoms, such as back pain or burning while urinating. Get help right away if:  You develop signs of an infection such as: ? A burning sensation when you urinate. ? Have pain when you urinate. ? Develop an intense need to urinate. ? Urinating more frequently. ? Back pain or pelvic pain. ? Fever or chills.  You have blood in your urine.  Your urine becomes discolored or cloudy.  Your urine smells bad.  You have severe pain that cannot be controlled with medicine. Summary  Asymptomatic bacteriuria is the presence of a large number of bacteria in the urine without the usual symptoms of burning or frequent urination.  Usually, treatment is not needed for this condition. Treating the condition can lead to other problems, such as too much yeast and the growth of antibiotic-resistant bacteria.  Some people, such as pregnant women and people with kidney transplants, do need treatment with antibiotic medicines to prevent kidney  infection (pyelonephritis).  If you were prescribed an antibiotic medicine, take it as told by your health care provider. Do not stop taking the antibiotic even if you start to feel better. This information is not intended to replace advice given to you by your health care provider. Make sure you discuss any  questions you have with your health care provider. Document Released: 08/05/2005 Document Revised: 07/30/2016 Document Reviewed: 07/30/2016 [employer]vier Interactive Patient Education  2019 [employer]  

## 2019-01-01 LAB — CULTURE, BLOOD (ROUTINE X 2)
Culture: NO GROWTH
Culture: NO GROWTH

## 2019-02-04 NOTE — Progress Notes (Signed)
Reviewed and agree with assessment/plan.   Arien Benincasa, MD [facility] 08/14/2016, 12:24 PM Pager:  [phone]  

## 2019-05-20 DEATH — deceased

## 2020-05-30 IMAGING — CR DG CHEST 2V
2 series · 2 of 2 positions shown · non-contrast
Comparison: 05/20/2018

CLINICAL DATA: Increasing shortness of breath

EXAM:
CHEST - 2 VIEW

[w chest lat]
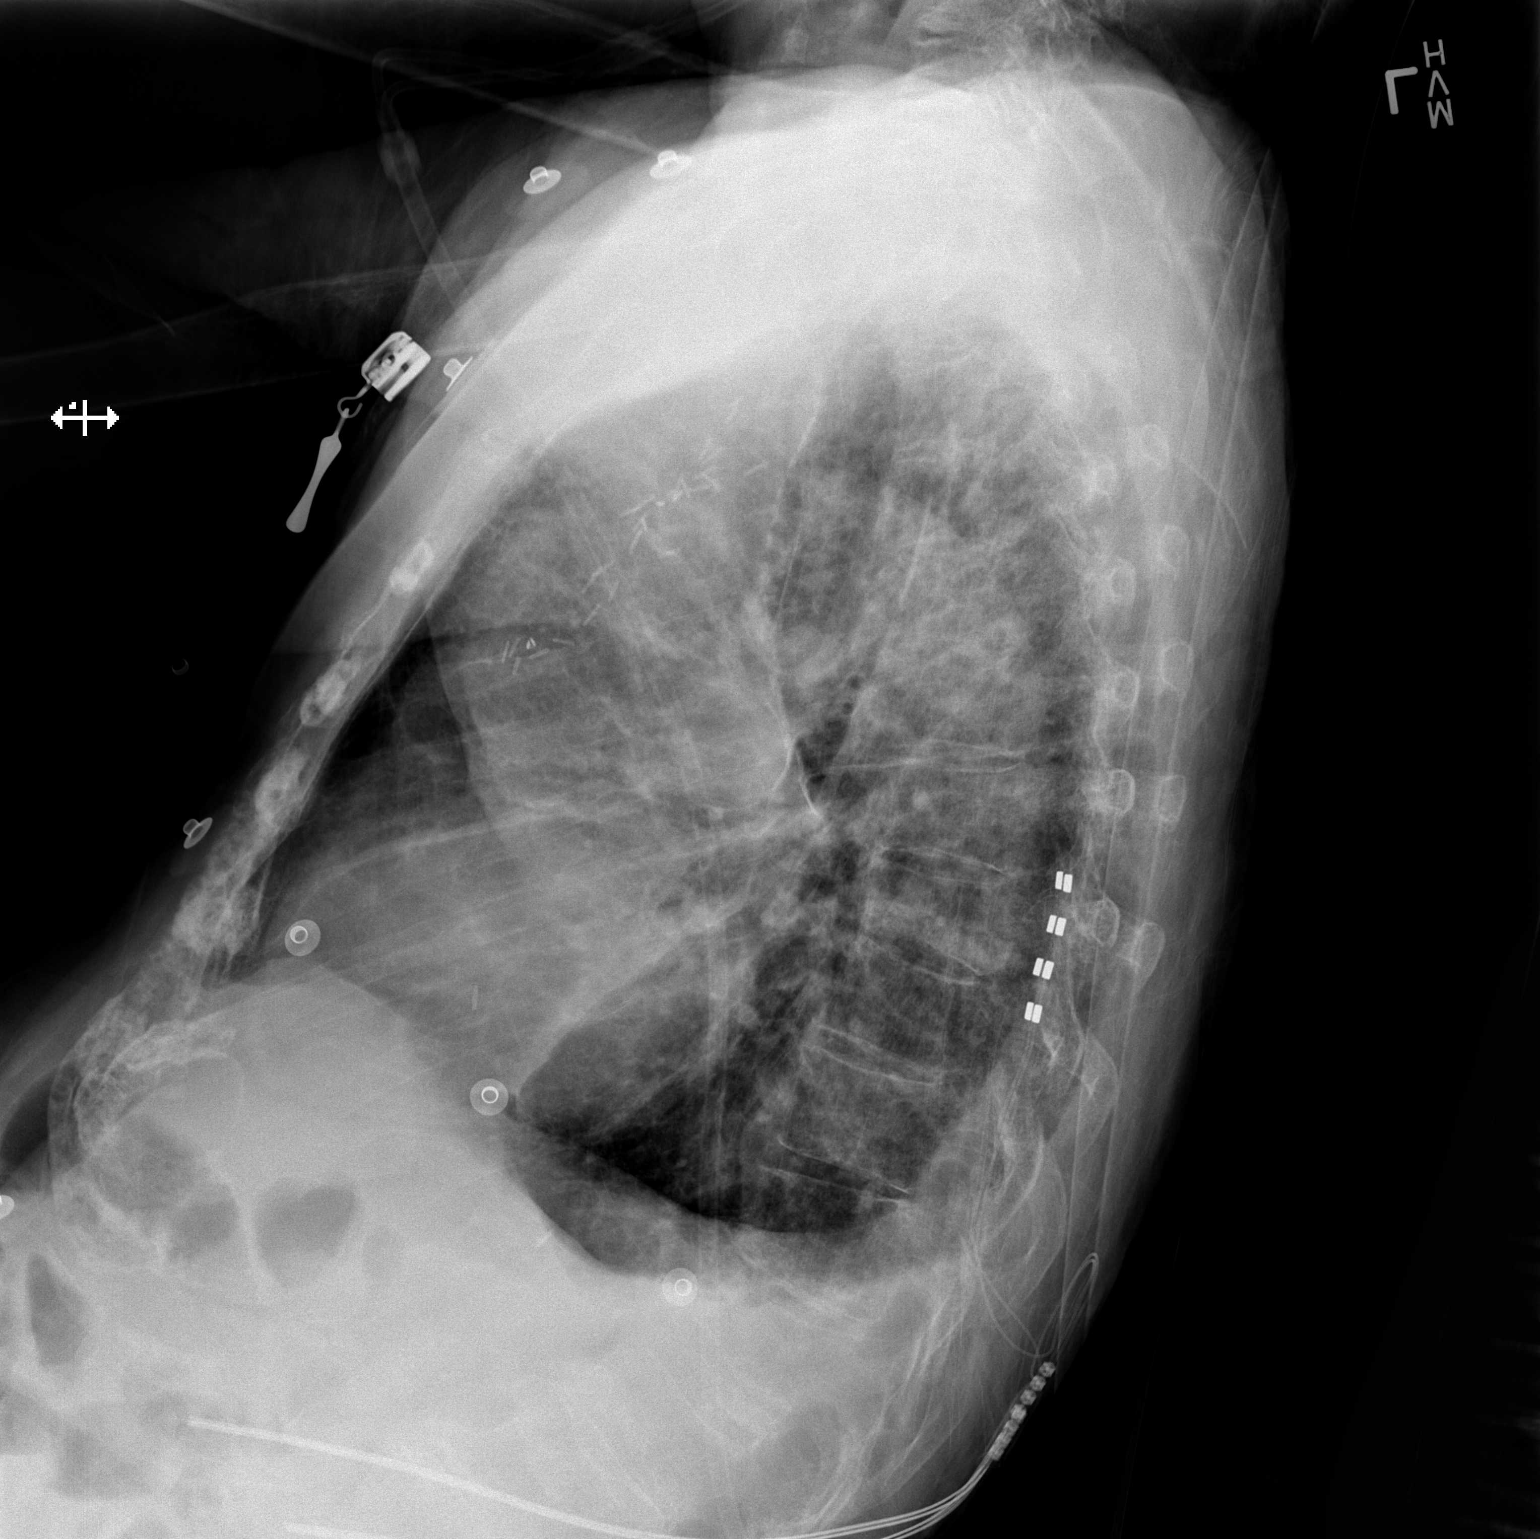

[x chest ap]
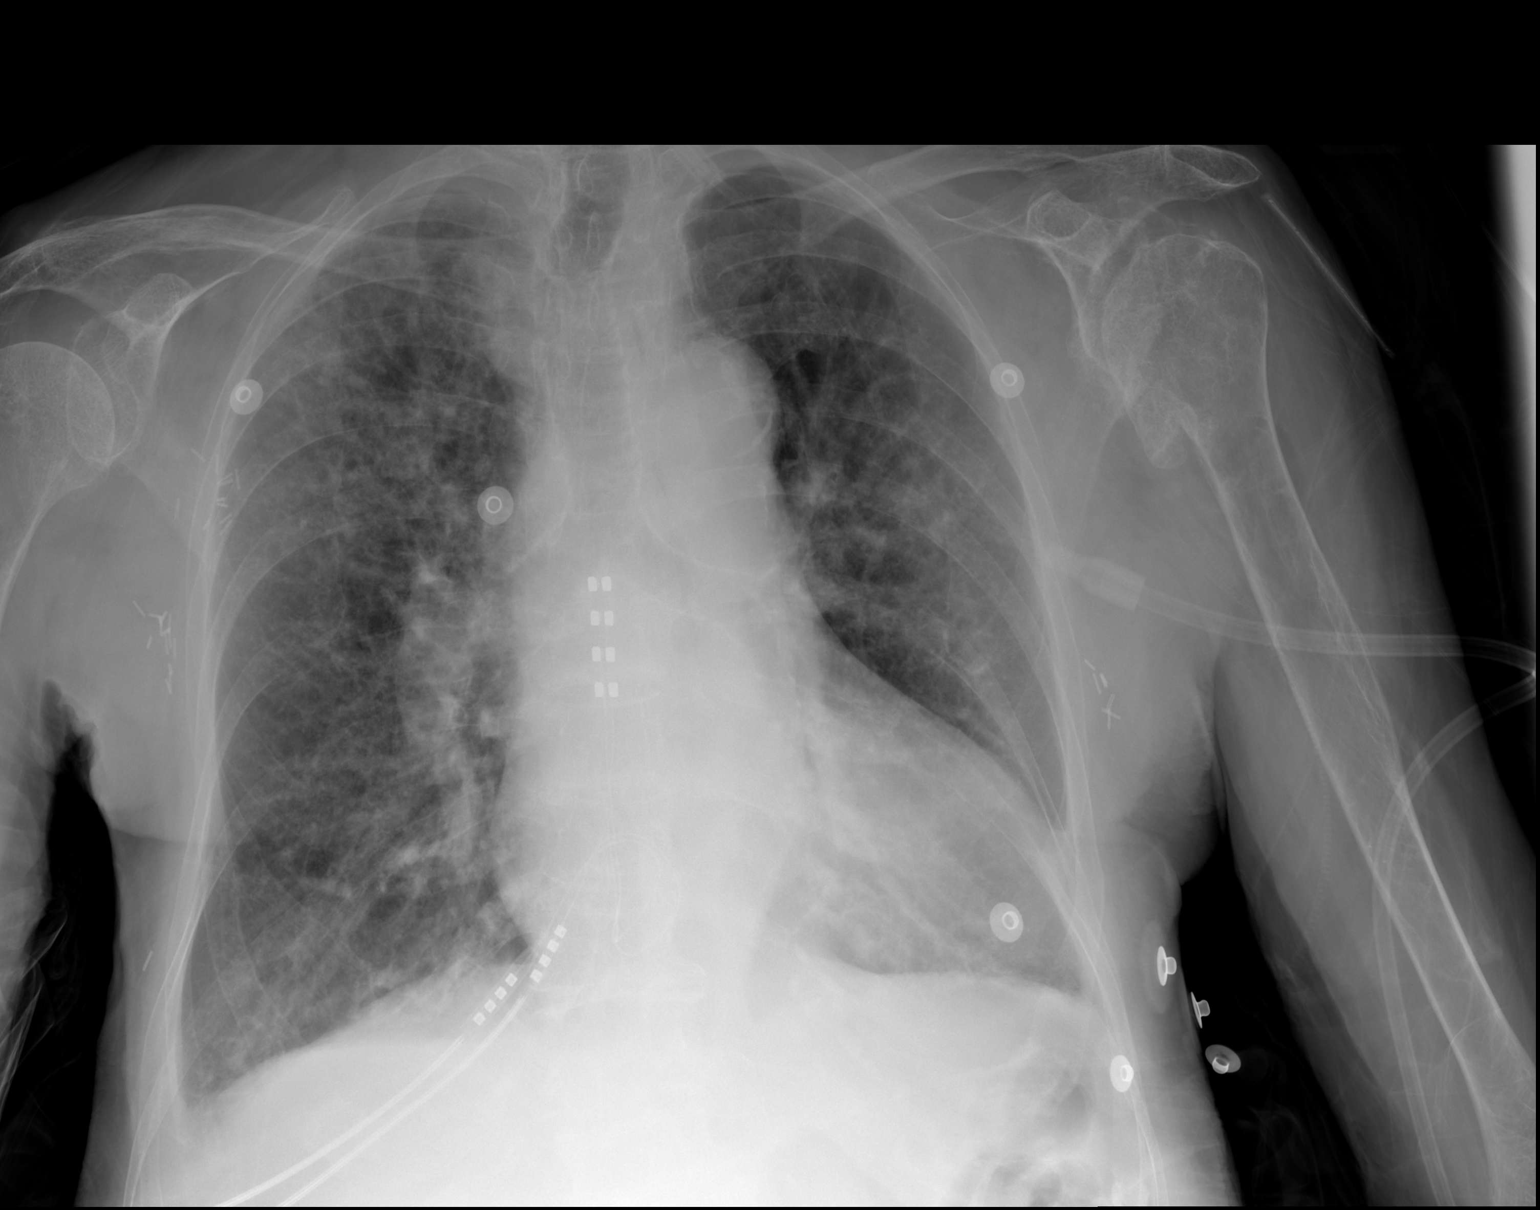

[2 of 2 positions shown; findings below may reference images not displayed]

FINDINGS: Cardiac shadow is enlarged but stable. Stimulator leads are again
seen and stable. The lungs are well aerated bilaterally. Bilateral
small effusions are noted. Increasing parenchymal densities are seen
bilaterally particularly in the right upper lobe and left mid lung.
No significant vascular congestion is noted. No acute bony
abnormality is noted.
IMPRESSION: Bilateral small effusions and patchy parenchymal opacities likely
representing early infiltrate.

## 2020-08-08 IMAGING — CT CT HEAD W/O CM
3 series · 15 of 47 positions shown, 18 images · non-contrast
Comparison: CT brain 09/02/2018

CLINICAL DATA: Unwitnessed fall

EXAM:
CT HEAD WITHOUT CONTRAST
TECHNIQUE: Contiguous axial images were obtained from the base of the skull
through the vertex without intravenous contrast.

[Series 2: head wo · axial · 0.46mm/px · z∈[+1665,+1790]mm · 9 of 30 slices shown, 12 images]
[im 3/30  brain]
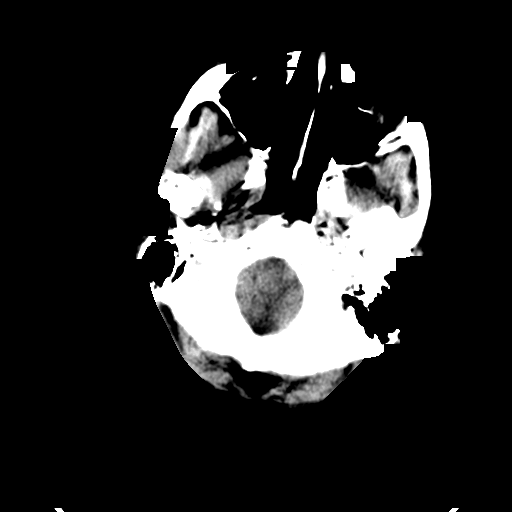
[im 3/30  bone]
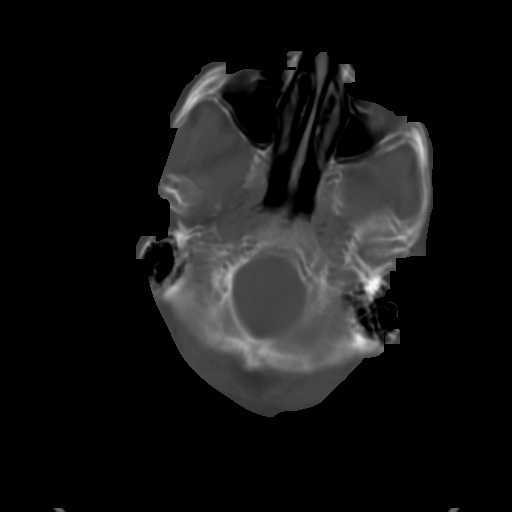
[im 6/30  brain]
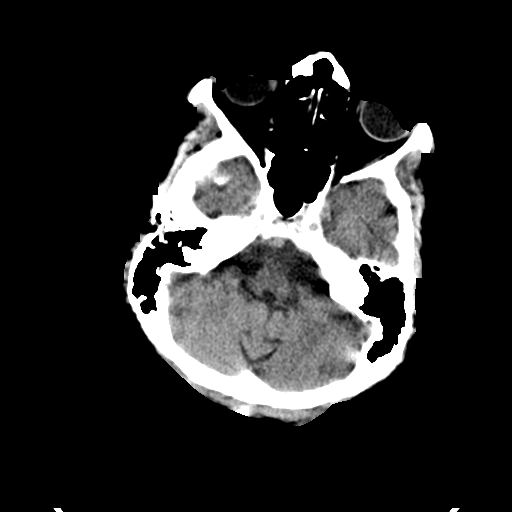
[im 9/30  brain]
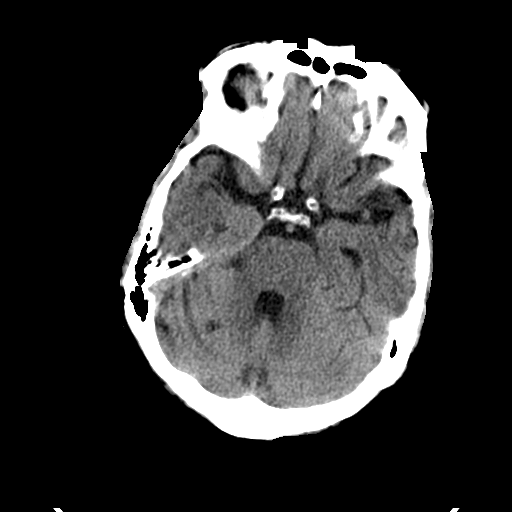
[im 12/30  brain]
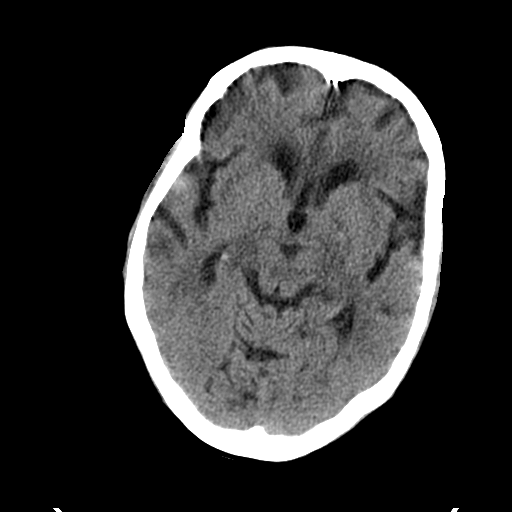
[im 16/30  brain]
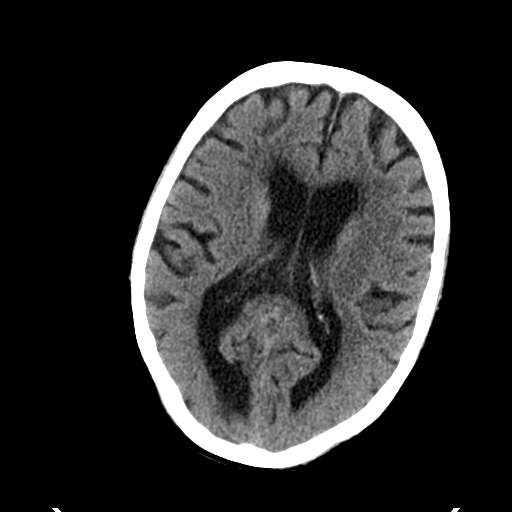
[im 16/30  bone]
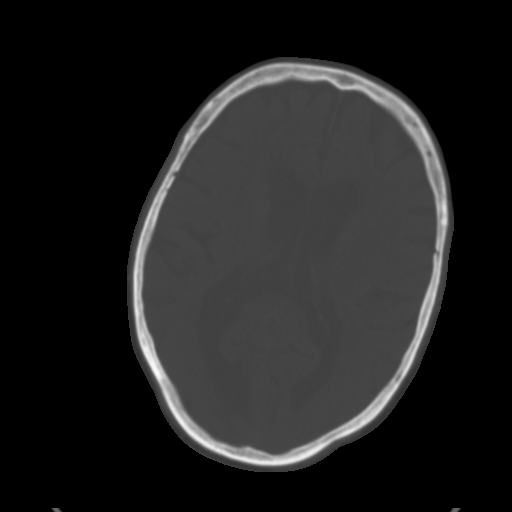
[im 19/30  brain]
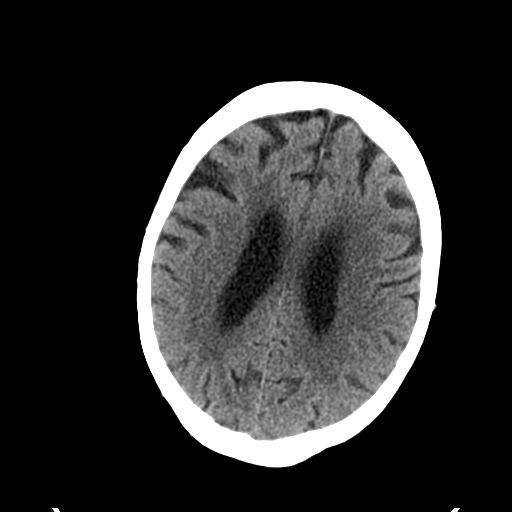
[im 22/30  brain]
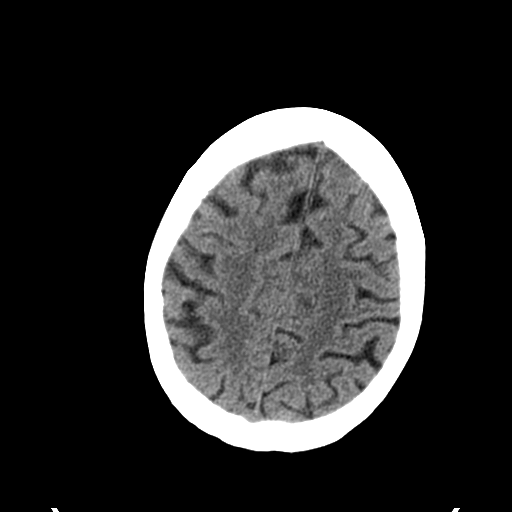
[im 25/30  brain]
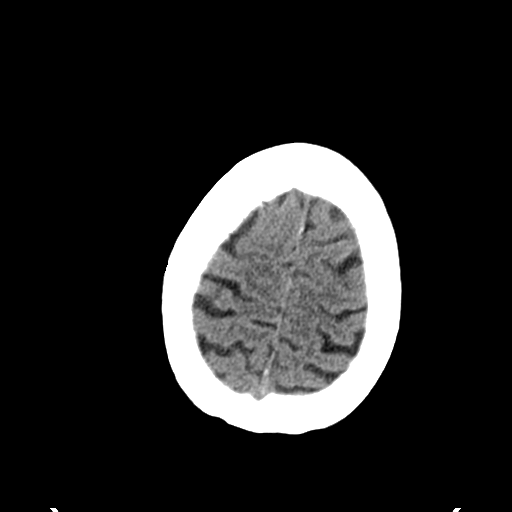
[im 28/30  brain]
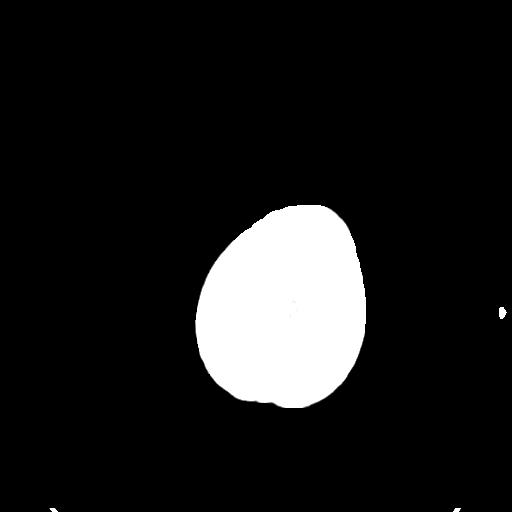
[im 28/30  bone]
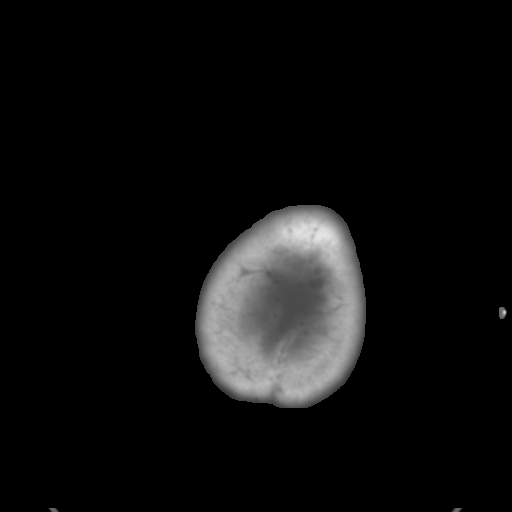

[Series 4: coronal soft tissue · coronal · 0.30mm/px · 3 of 73 slices shown]
[im 25/73  brain]
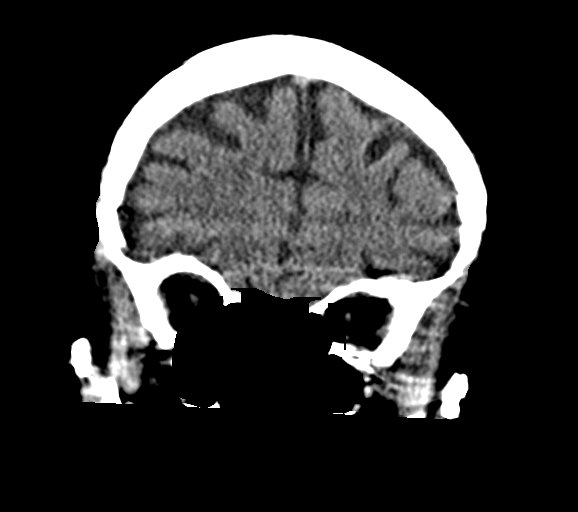
[im 33/73  brain]
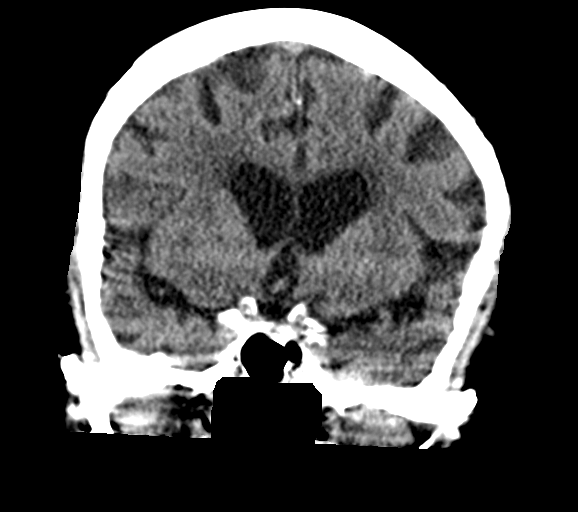
[im 41/73  brain]
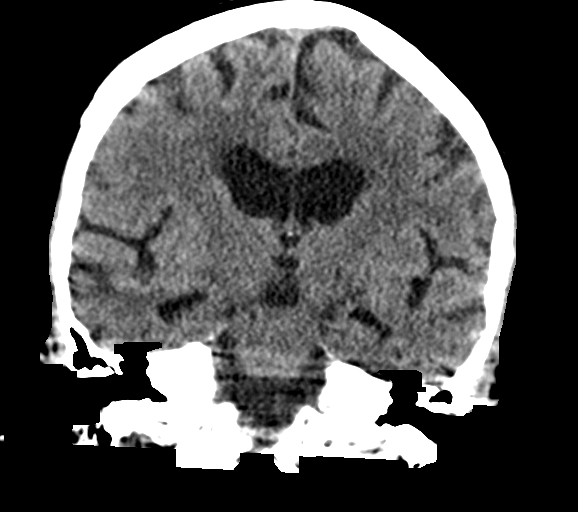

[Series 5: sagittal soft tissue · sagittal · 0.30mm/px · 3 of 52 slices shown]
[im 18/52  brain]
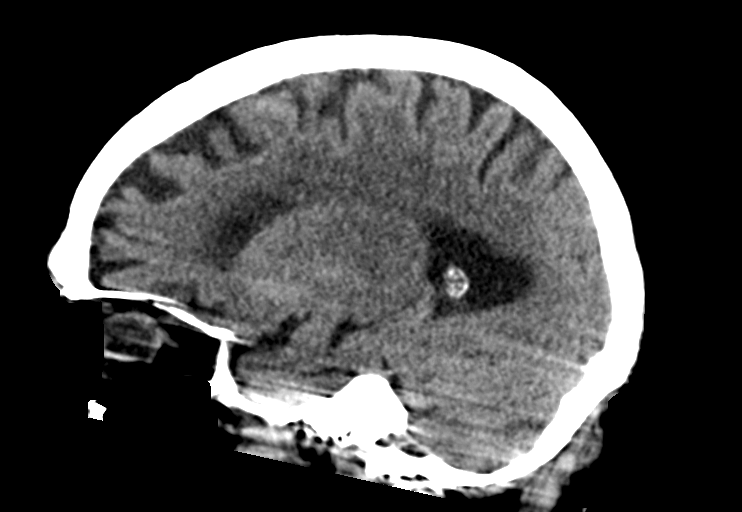
[im 26/52  brain]
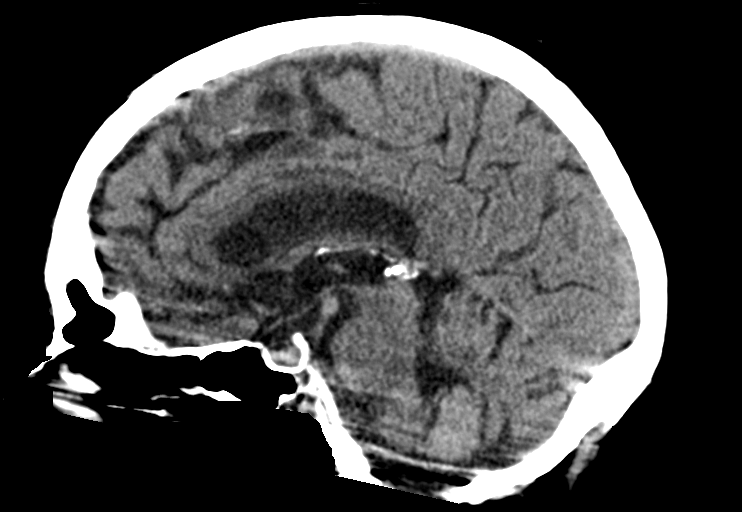
[im 35/52  brain]
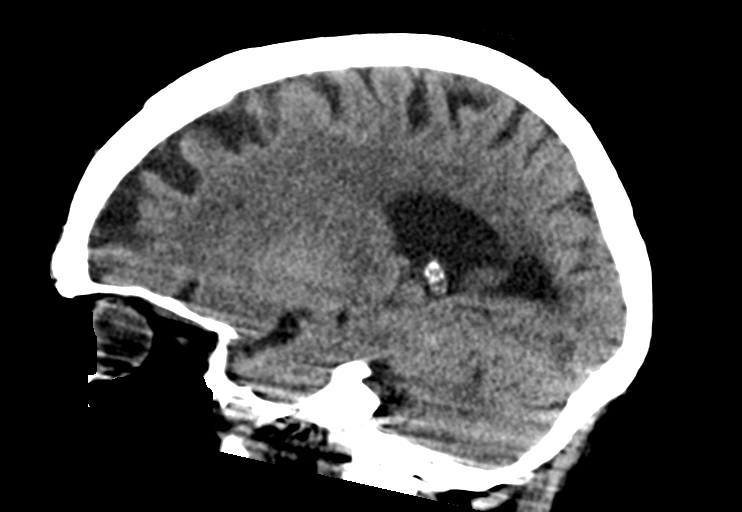

[15 of 47 positions shown; findings below may reference images not displayed]

FINDINGS: Brain: No acute territorial infarction, hemorrhage, or intracranial
mass. Mild motion degradation. Chronic infarcts in the right
thalamus, right occipital lobe, and right cerebellum. Moderate
atrophy. Mild small vessel ischemic changes of the white matter.
Stable ventricle size.

Vascular: No hyperdense vessels.  Carotid vascular calcification

Skull: Fracture

Sinuses/Orbits: No acute finding.

Other: None
IMPRESSION: 1. Mild motion degraded study
2. No definite CT evidence for acute intracranial abnormality
3. Atrophy and mild small vessel ischemic changes of the white
matter. Old infarcts in the right thalamus, occipital lobe and right
cerebellum.
